# Patient Record
Sex: Female | Born: 1950 | Race: Black or African American | Hispanic: No | Marital: Single | Smoking: Former smoker
Health system: Southern US, Community
[De-identification: ages and names within clinical notes are randomized; demographics above are authoritative.]

## PROBLEM LIST (undated history)

## (undated) DIAGNOSIS — K4090 Unilateral inguinal hernia, without obstruction or gangrene, not specified as recurrent: Secondary | ICD-10-CM

## (undated) DIAGNOSIS — Z9889 Other specified postprocedural states: Secondary | ICD-10-CM

## (undated) DIAGNOSIS — R112 Nausea with vomiting, unspecified: Secondary | ICD-10-CM

## (undated) DIAGNOSIS — G20 Parkinson's disease: Secondary | ICD-10-CM

## (undated) DIAGNOSIS — M1990 Unspecified osteoarthritis, unspecified site: Secondary | ICD-10-CM

## (undated) DIAGNOSIS — Z923 Personal history of irradiation: Secondary | ICD-10-CM

## (undated) DIAGNOSIS — C50919 Malignant neoplasm of unspecified site of unspecified female breast: Secondary | ICD-10-CM

## (undated) DIAGNOSIS — K219 Gastro-esophageal reflux disease without esophagitis: Secondary | ICD-10-CM

## (undated) DIAGNOSIS — G20A1 Parkinson's disease without dyskinesia, without mention of fluctuations: Secondary | ICD-10-CM

## (undated) DIAGNOSIS — K5909 Other constipation: Secondary | ICD-10-CM

## (undated) DIAGNOSIS — L4050 Arthropathic psoriasis, unspecified: Secondary | ICD-10-CM

## (undated) DIAGNOSIS — Z9221 Personal history of antineoplastic chemotherapy: Secondary | ICD-10-CM

## (undated) HISTORY — PX: TOTAL KNEE ARTHROPLASTY: SHX125

## (undated) HISTORY — DX: Parkinson's disease: G20

## (undated) HISTORY — DX: Parkinson's disease without dyskinesia, without mention of fluctuations: G20.A1

## (undated) HISTORY — PX: CATARACT EXTRACTION: SUR2

## (undated) HISTORY — PX: MYOMECTOMY: SHX85

## (undated) HISTORY — DX: Arthropathic psoriasis, unspecified: L40.50

## (undated) HISTORY — DX: Unspecified osteoarthritis, unspecified site: M19.90

## (undated) HISTORY — DX: Malignant neoplasm of unspecified site of unspecified female breast: C50.919

## (undated) HISTORY — DX: Unilateral inguinal hernia, without obstruction or gangrene, not specified as recurrent: K40.90

## (undated) HISTORY — DX: Nausea with vomiting, unspecified: R11.2

---

## 1998-11-02 DIAGNOSIS — I2699 Other pulmonary embolism without acute cor pulmonale: Secondary | ICD-10-CM

## 1998-11-02 DIAGNOSIS — C50919 Malignant neoplasm of unspecified site of unspecified female breast: Secondary | ICD-10-CM

## 1998-11-02 HISTORY — DX: Other pulmonary embolism without acute cor pulmonale: I26.99

## 1998-11-02 HISTORY — PX: MASTECTOMY: SHX3

## 1998-11-02 HISTORY — DX: Malignant neoplasm of unspecified site of unspecified female breast: C50.919

## 2000-02-20 ENCOUNTER — Ambulatory Visit: Admission: RE | Admit: 2000-02-20 | Payer: Self-pay | Source: Ambulatory Visit | Admitting: Plastic Surgery

## 2000-10-29 ENCOUNTER — Inpatient Hospital Stay
Admission: RE | Admit: 2000-10-29 | Disposition: A | Discharge status: Home / Self Care | Payer: Self-pay | Source: Ambulatory Visit | Admitting: Plastic Surgery

## 2001-11-21 ENCOUNTER — Ambulatory Visit
Admit: 2001-11-21 | Disposition: A | Discharge status: Home / Self Care | Payer: Self-pay | Source: Ambulatory Visit | Admitting: Family Medicine

## 2003-06-09 ENCOUNTER — Emergency Department: Admit: 2003-06-09 | Payer: Self-pay | Source: Emergency Department | Admitting: Emergency Medicine

## 2003-11-03 HISTORY — PX: BREAST LUMPECTOMY: SHX2

## 2004-05-27 ENCOUNTER — Ambulatory Visit
Admit: 2004-05-27 | Disposition: A | Discharge status: Home / Self Care | Payer: Self-pay | Source: Ambulatory Visit | Admitting: Surgery

## 2004-06-07 ENCOUNTER — Ambulatory Visit
Admit: 2004-06-07 | Disposition: A | Discharge status: Home / Self Care | Payer: Self-pay | Source: Ambulatory Visit | Admitting: Surgery

## 2004-06-12 ENCOUNTER — Ambulatory Visit
Admit: 2004-06-12 | Disposition: A | Discharge status: Home / Self Care | Payer: Self-pay | Source: Ambulatory Visit | Admitting: Surgery

## 2004-06-12 ENCOUNTER — Ambulatory Visit: Admission: RE | Admit: 2004-06-12 | Payer: Self-pay | Source: Ambulatory Visit | Admitting: Surgery

## 2004-07-02 ENCOUNTER — Ambulatory Visit
Admit: 2004-07-02 | Disposition: A | Discharge status: Home / Self Care | Payer: Self-pay | Source: Ambulatory Visit | Admitting: Radiation Oncology

## 2004-09-30 ENCOUNTER — Ambulatory Visit
Admit: 2004-09-30 | Disposition: A | Discharge status: Home / Self Care | Payer: Self-pay | Source: Ambulatory Visit | Admitting: Radiation Oncology

## 2004-11-22 ENCOUNTER — Emergency Department: Admit: 2004-11-22 | Payer: Self-pay | Source: Emergency Department | Admitting: Emergency Medicine

## 2007-02-01 ENCOUNTER — Emergency Department: Admit: 2007-02-01 | Payer: Self-pay | Source: Emergency Department | Admitting: Emergency Medicine

## 2007-06-27 ENCOUNTER — Ambulatory Visit
Admit: 2007-06-27 | Disposition: A | Discharge status: Home / Self Care | Payer: Self-pay | Source: Ambulatory Visit | Admitting: Medical Oncology

## 2011-06-02 ENCOUNTER — Ambulatory Visit
Admit: 2011-06-02 | Discharge: 2011-06-02 | Disposition: A | Discharge status: Home / Self Care | Payer: Self-pay | Source: Ambulatory Visit

## 2011-06-02 LAB — CBC
Hematocrit: 44.4 % (ref 37.0–47.0)
Hgb: 15.4 g/dL (ref 12.0–16.0)
MCH: 33.5 pg — ABNORMAL HIGH (ref 28.0–32.0)
MCHC: 34.7 g/dL (ref 32.0–36.0)
MCV: 96.5 fL (ref 80.0–100.0)
MPV: 10.5 fL (ref 9.4–12.3)
Nucleated RBC: 0 /100 WBC
Platelets: 192 10*3/uL (ref 140–400)
RBC: 4.6 10*6/uL (ref 4.20–5.40)
RDW: 12 % (ref 12–15)
WBC: 3.82 10*3/uL (ref 3.50–10.80)

## 2011-06-02 LAB — COMPREHENSIVE METABOLIC PANEL
ALT: 20 U/L (ref 0–55)
AST (SGOT): 22 U/L (ref 5–34)
Albumin/Globulin Ratio: 1.3 (ref 0.9–2.2)
Albumin: 3.8 g/dL (ref 3.5–5.0)
Alkaline Phosphatase: 64 U/L (ref 40–150)
Anion Gap: 11 (ref 5.0–15.0)
BUN: 17 mg/dL (ref 7.0–19.0)
Bilirubin, Total: 0.4 mg/dL (ref 0.2–1.2)
CO2: 22 mEq/L (ref 22–29)
Calcium: 9.4 mg/dL (ref 8.5–10.5)
Chloride: 106 mEq/L (ref 98–107)
Creatinine: 0.6 mg/dL (ref 0.6–1.0)
Globulin: 3 g/dL (ref 2.0–3.6)
Glucose: 86 mg/dL (ref 70–100)
Potassium: 4.2 mEq/L (ref 3.5–5.1)
Protein, Total: 6.8 g/dL (ref 6.0–8.3)
Sodium: 139 mEq/L (ref 136–145)

## 2011-06-02 LAB — HEMOLYSIS INDEX: Hemolysis Index: 31 Index — ABNORMAL HIGH (ref 0–18)

## 2011-06-02 LAB — GFR: EGFR: >60

## 2011-06-05 ENCOUNTER — Ambulatory Visit
Admission: RE | Admit: 2011-06-05 | Discharge: 2011-06-05 | Payer: Self-pay | Source: Ambulatory Visit | Attending: Podiatrist | Admitting: Podiatrist

## 2011-07-17 LAB — ECG 12-LEAD
Atrial Rate: 76 {beats}/min
P Axis: 54 degrees
P-R Interval: 160 ms
Q-T Interval: 382 ms
QRS Duration: 86 ms
QTC Calculation (Bezet): 429 ms
R Axis: 61 degrees
T Axis: 54 degrees
Ventricular Rate: 76 {beats}/min

## 2011-09-19 NOTE — Op Note (Signed)
Yvette Anthony, Yvette Anthony      MRN:          [id number]      Account:      [id number]      Document ID:  [id number]      Procedure Date: 06/05/2011            Admit Date: 06/05/2011            Patient Location: DISCHARGED 06/05/2011      Patient Type: A            SURGEON: Barton Fanny DPM      ASSISTANT:  Earna Coder DPM                  PREOPERATIVE DIAGNOSIS:      Hammertoe deformities, fourth and fifth left foot.            POSTOPERATIVE DIAGNOSIS:      Hammertoe deformities, fourth and fifth left foot.            TITLE OF PROCEDURE:      Hammertoe correction, fourth and fifth toes, left foot.            ANESTHESIA:      MAC.            HEMOSTASIS:      Ankle tourniquet.            DESCRIPTION OF PROCEDURE:      On June 05, 2011, the patient was taken from the preoperative area to the      operating room and placed on the operating table in supine position.      Anesthesia was administered and the left foot was then anesthetized in the      form of a local block.  The left foot was then prepped and draped in the      normal and customary fashion.  Left foot exsanguinated, and the left ankle      tourniquet inflated.            Two semi-converging elliptical incisions were then made over the proximal      interphalangeal joint of the fourth toe of the left foot.  Incisions were      deepened and the intervening wedge of tissue was excised.  The dissection      was carried down to the level of the proximal interphalangeal joint.      Transverse capsulotomy was made into the proximal interphalangeal joint.      All capsular attachments to the head of the proximal phalanx were freed.      The head of the proximal phalanx was then resected in total.  Surgical site      was copiously irrigated.  The capsular structures were closed utilizing      simple interrupted sutures of 3-0 Vicryl, and the skin was closed utilizing      simple interrupted sutures of 4-0 nylon.            Two semi-converging elliptical incisions were  then made over the proximal      interphalangeal joint of the fifth toe of the left foot.  The incisions                                   Page 1 of 2      Yvette Anthony, Yvette Anthony      MRN:  [id number]      Account:      [id number]      Document ID:  [id number]      Procedure Date: 06/05/2011            were deepened and the intervening wedge of tissue was excised.  Dissection      was carried down to the level of the proximal interphalangeal joint, taking      care to preserve and retract all vital structures.  Transverse capsulotomy      was made in the proximal interphalangeal joint.  All capsular attachments      to the head of the proximal phalanx were freed.  The head of the proximal      phalanx was then resected in total.  Surgical site was copiously irrigated.       Capsular structures were reopposed utilizing simple interrupted sutures of      3-0 Vicryl, and the skin was closed utilizing simple interrupted sutures of      4-0 nylon.            A dry sterile dressing consisting of Adaptic, 4 x 4 gauze, Kling, Kerlix,      and an Ace wrap was applied to the left foot.  The tourniquet was released      and vascular status was noted to return immediately to normal preoperative      levels.            The patient tolerated the procedure and anesthesia well.  There were no      complications.  Estimated blood loss was nil.  The patient was taken from      the operating room to the recovery room awake with vital signs stable.                        Electronic Signing Provider            D:  06/23/2011 11:40 AM by Dr. Barton Fanny, DPM [id number]      T:  06/23/2011 11:57 AM by [id number]                  cc:                                   Page 2 of 2      Authenticated by Barton Fanny, DPM ([id number]) On 06/23/2011 06:22:57 PM

## 2012-03-27 ENCOUNTER — Emergency Department
Admit: 2012-03-27 | Discharge: 2012-03-27 | Disposition: A | Discharge status: Home / Self Care | Payer: Self-pay | Source: Emergency Department | Admitting: Emergency Medicine

## 2012-03-27 LAB — CBC AND DIFFERENTIAL
Basophils Absolute Automated: 0.02 10*3/uL (ref 0.00–0.20)
Basophils Automated: 0 % (ref 0–2)
Eosinophils Absolute Automated: 0.04 10*3/uL (ref 0.00–0.70)
Eosinophils Automated: 1 % (ref 0–5)
Hematocrit: 48.6 % — ABNORMAL HIGH (ref 37.0–47.0)
Hgb: 16.6 g/dL — ABNORMAL HIGH (ref 12.0–16.0)
Lymphocytes Absolute Automated: 1.02 10*3/uL (ref 0.50–4.40)
Lymphocytes Automated: 21 % (ref 15–41)
MCH: 33.6 pg — ABNORMAL HIGH (ref 28.0–32.0)
MCHC: 34.2 g/dL (ref 32.0–36.0)
MCV: 98.4 fL (ref 80.0–100.0)
MPV: 9.6 fL (ref 9.4–12.3)
Monocytes Absolute Automated: 0.32 10*3/uL (ref 0.00–1.20)
Monocytes: 6 % (ref 0–11)
Neutrophils Absolute: 3.53 10*3/uL (ref 1.80–8.10)
Neutrophils: 72 % (ref 52–75)
Platelets: 220 10*3/uL (ref 140–400)
RBC: 4.94 10*6/uL (ref 4.20–5.40)
RDW: 13 % (ref 12–15)
WBC: 4.93 10*3/uL (ref 3.50–10.80)

## 2012-03-27 LAB — URINALYSIS, REFLEX TO MICROSCOPIC EXAM IF INDICATED
Blood, UA: NEGATIVE
Glucose, UA: NEGATIVE
Leukocyte Esterase, UA: NEGATIVE
Nitrite, UA: NEGATIVE
Protein, UR: 30 — AB
RBC, UA: NONE SEEN /HPF (ref 0–5)
Specific Gravity UA: 1.025 (ref 1.001–1.035)
Urine pH: 6 (ref 5.0–8.0)
Urobilinogen, UA: 0.2 mg/dL
WBC, UA: NONE SEEN /HPF (ref 0–5)

## 2012-03-27 LAB — GFR: EGFR: >60

## 2012-03-27 LAB — BASIC METABOLIC PANEL
BUN: 16 mg/dL (ref 7–21)
CO2: 27 mEq/L (ref 22–31)
Calcium: 9.9 mg/dL (ref 8.6–10.2)
Chloride: 102 mEq/L (ref 98–107)
Creatinine: 0.6 mg/dL (ref 0.5–1.4)
Glucose: 99 mg/dL (ref 70–100)
Potassium: 4 mEq/L (ref 3.6–5.0)
Sodium: 142 mEq/L (ref 136–143)

## 2012-10-17 ENCOUNTER — Emergency Department
Admission: EM | Admit: 2012-10-17 | Discharge: 2012-10-17 | Disposition: A | Discharge status: Home / Self Care | Payer: BLUE CROSS/BLUE SHIELD | Attending: Emergency Medicine | Admitting: Emergency Medicine

## 2012-10-17 ENCOUNTER — Emergency Department: Payer: BLUE CROSS/BLUE SHIELD

## 2012-10-17 DIAGNOSIS — J40 Bronchitis, not specified as acute or chronic: Secondary | ICD-10-CM | POA: Insufficient documentation

## 2012-10-17 DIAGNOSIS — Z86711 Personal history of pulmonary embolism: Secondary | ICD-10-CM | POA: Insufficient documentation

## 2012-10-17 DIAGNOSIS — Z853 Personal history of malignant neoplasm of breast: Secondary | ICD-10-CM | POA: Insufficient documentation

## 2012-10-17 MED ORDER — AZITHROMYCIN 250 MG PO TABS
250.00 mg | ORAL_TABLET | Freq: Every day | ORAL | Status: AC
Start: 2012-10-17 — End: 2012-10-23

## 2012-10-17 NOTE — Discharge Instructions (Signed)
Bronchitis    You have been diagnosed with bronchitis.    Bronchitis is an irritation of the large breathing tubes. It can be caused by tobacco smoke, air pollution, or an infection. Patients with bronchitis are short of breath and may cough up green or yellow mucous. These symptoms are usually worse at night, when lying flat and in wet weather. Most people with bronchitis do not need antibiotics. If your doctor prescribes antibiotics, fill the prescription and take all the medicine according to the instructions.    Bronchitis is usually treated with medicine to help stop coughing. An inhaler with Albuterol or Ventolin is sometimes used to help with cough. It is best to use the inhaler with a spacer to help the medicine reach your lungs. The doctor can prescribe a spacer.    Bronchitis is usually caused by a virus. Antibiotics do not kill viruses. In fact, antibiotics do not affect viruses in any way. In the past, some doctors prescribed antibiotics for people with bronchitis. We now know that antibiotics are not helpful for most bronchitis patients. Patients who might need antibiotics are those with lung problems that don't go away, like emphysema or COPD.    Your coughing and wheezing might last for 2 or 3 weeks! The symptoms should get better over this time period and not worse.    Do not smoke. Research shows that smoking causes heart disease, cancer, and birth defects. Avoiding smoking will help your asthma. If you smoke, ask your doctor for ideas about how to stop.   If you do not smoke, avoid others who do.    YOU SHOULD SEEK MEDICAL ATTENTION IMMEDIATELY, EITHER HERE OR AT THE NEAREST EMERGENCY DEPARTMENT, IF ANY OF THE FOLLOWING OCCURS:   You wheeze or have trouble breathing.   You have a fever (temperature higher than 101 F or 38.3 C), that won't go away.   You have chest pain.   You vomit or cannot keep liquids down or you feel weak or dizzy.   Your symptoms get worse over the next 2 or 3  days.

## 2012-10-17 NOTE — ED Provider Notes (Signed)
EMERGENCY DEPARTMENT HISTORY AND PHYSICAL EXAM     Physician/Midlevel provider first contact with patient: 10/17/12 1435         Date: 10/17/2012  Patient Name: Yvette Anthony    History of Presenting Illness     Chief Complaint   Patient presents with   . Cough       History Provided By: Patient    Chief Complaint: Cough  Onset: 4 Days  Timing: Gradual  Location: Respiratory  Quality: Worsening  Severity: Moderate  Modifying Factors: Nyquil without relief  Associated Symptoms: SOB, Rhinorrhea    Additional History: Yvette Anthony is a 61 y.o. female pt c/o productive cough with dark yellow phlegm since 4 days. Pt has some SOB and rhinorrhea sxs. Pt has been taking Nyquil without relief. Pt also reports co-workers having PNA sxs, why pt came into ED for evaluation.   Denies fever, CP.    PCP: Rudene Anda, MD      No current facility-administered medications for this encounter.     Current Outpatient Prescriptions   Medication Sig Dispense Refill   . Eszopiclone (ESZOPICLONE) 3 MG tablet Take 3 mg by mouth nightly. Take immediately before bedtime       . glucosamine-chondroitin 500-400 MG tablet Take 1 tablet by mouth 3 (three) times daily.       Marland Kitchen venlafaxine (EFFEXOR) 75 MG tablet Take 75 mg by mouth 2 (two) times daily.       Marland Kitchen azithromycin (ZITHROMAX Z-PAK) 250 MG tablet Take 1 tablet (250 mg total) by mouth daily. 2 tablets on day one, then 1 tablet on days 2 through 5  6 tablet  0       Past History     Past Medical History:  Past Medical History   Diagnosis Date   . Cancer of breast, female    . Pulmonary embolism 2000       Past Surgical History:  Past Surgical History   Procedure Date   . Mastectomy      left   . Breast lumpectomy      right   . Myomectomy        Family History:  History reviewed. No pertinent family history.    Social History:  History   Substance Use Topics   . Smoking status: Former Smoker -- 10 years     Types: Cigarettes     Quit date: 10/17/1997   . Smokeless tobacco: Not on file   .  Alcohol Use: Yes      Comment: rare       Allergies:  Allergies   Allergen Reactions   . Ciprofloxacin Nausea Only       Review of Systems     Review of Systems   Constitutional: Negative for fever.   HENT: Positive for rhinorrhea.    Respiratory: Positive for cough (Productive with yellow phlegm) and shortness of breath.    Cardiovascular: Negative for chest pain.   Psychiatric/Behavioral: Negative for suicidal ideas.   All other systems reviewed and are negative.      Physical Exam   BP 151/75  Pulse 75  Temp 98.6 F (37 C) (Oral)  Resp 22  Ht 1.778 m  Wt 86.183 kg  BMI 27.26 kg/m2  SpO2 96%    Physical Exam   Nursing note and vitals reviewed.  Constitutional: She is oriented to person, place, and time. She appears well-developed and well-nourished.   HENT:   Right Ear: External  ear normal.   Left Ear: External ear normal.   Mouth/Throat: No oropharyngeal exudate.   Eyes: Conjunctivae normal are normal. Pupils are equal, round, and reactive to light.   Cardiovascular: Normal rate and regular rhythm.    Pulmonary/Chest: Effort normal and breath sounds normal. No respiratory distress. She has no wheezes.   Lymphadenopathy:     She has no cervical adenopathy.   Neurological: She is alert and oriented to person, place, and time.   Skin: Skin is warm and dry.         Diagnostic Study Results     Labs -     Results     ** No Results found for the last 24 hours. **          Radiologic Studies -   Radiology Results (24 Hour)     ** No Results found for the last 24 hours. **          Medical Decision Making   I am the first provider for this patient.    I reviewed the vital signs, available nursing notes, past medical history, past surgical history, family history and social history.    Vital Signs-Reviewed the patient's vital signs.     Patient Vitals for the past 12 hrs:   BP Temp Pulse Resp   10/17/12 1423 151/75 mmHg 98.6 F (37 C) 75  22        Pulse Oximetry Analysis - Normal 96% on RA    Old Medical  Records: Nursing notes.     ED Course:   2:41 PM  Discussed results with pt and counseled on diagnosis, f/u plans, and signs and symptoms when to return to ED.  Pt is stable and ready for discharge.      Provider Notes:  Uncomplicated bronchitis.  Given rx for azithromycin.      Procedures:      Diagnosis     Clinical Impression:   1. Bronchitis        _______________________________    Attestations: This note is prepared by Davina Poke, acting as Scribe for Dr. Tenny Craw, MD.    Dr. Tenny Craw, MD  - The scribe's documentation has been prepared under my direction and personally reviewed by me in its entirety.  I confirm that the note above accurately reflects all work, treatment, procedures, and medical decision making performed by me.  _______________________________            Sharyne Richters, MD  10/18/12 1020

## 2012-10-17 NOTE — ED Notes (Signed)
Cough for about 4 days, productive with yellow phlegm. Denies other sx

## 2012-10-24 NOTE — Discharge Summary (Unsigned)
JUNE, RODE                       #[id number]            ADMITTED:  10/29/2000               DISCHARGED:  11/01/2000            ATTENDING PHYSICIAN:                Guilford Shi, MD            REASON FOR ADMISSION:  Reconstruction of the left breast.            HISTORY:  The patient is a 61 year old female who was diagnosed with breast      cancer two years ago.  She underwent  a  mastectomy  at that time.  She was      also reconstructed initially with the  left  breast  with a tissue expander      and implant.  She has subsequently developed extensive capsular contracture      and presents now for revision and reconstruction  of  the  left breast with      autologous tissue.            HOSPITAL COURSE:  On 10/29/2000, she was  taken to the operating room where      a  periprosthetic  capsulectomy was performed  of  the  left  reconstructed      breast.  A TRAM flap was then performed  for  reconstruction  of  the  left      breast with autologous tissue.  The patient tolerated the procedure well.            She was discharged from recovery to the  floor  in  satisfactory condition.      Her  postoperative  course  was  unremarkable.    She  had  resumed  normal      ambulatory and dietary routines prior  to  discharge.   She  was discharged      with drains and subcutaneous tissue.   Instructions  were given for care of      the drains.            DISCHARGE MEDICATIONS included      1.  Keflex 500 mg one p.o. q.6h.      2.  Percocet one to two p.o. q.4h. p.r.n.            RESTRICTIONS/INSTRUCTIONS were given.            POSTOPERATIVE FOLLOWUP will be in five days.                                                              _____________________________________                                            _____  Sully Dyment Virgie Dad, MD      HJB/crw      D: 12/16/2000 4:00 P      T: 12/17/2000  3:15 P      J: [id number]      N: [id number]      CC: Guilford Shi, MD

## 2012-11-04 NOTE — Op Note (Unsigned)
PATIENTGREENLEY, Yvette Anthony      MEDICAL RECORD NUMBER:         [id number]      PATIENT LOCATION/SERVICE:      RAD            DATE OF SURGERY:               06/12/2004      SURGEON:                       Almira Bar, MD      ASSISTANT 1:            ANESTHESIA:  LOCAL MAC            PREOPERATIVE DIAGNOSIS:  CARCINOMA OF THE RIGHT BREAST            POSTOPERATIVE DIAGNOSIS:  CARCINOMA OF THE RIGHT BREAST            OPERATION:      1. PREOPERATIVE MAMMOGRAPHIC LOCALIZATION OF TUMOR, RIGHT BREAST      2. WIDE LOCAL EXCISION OF TUMOR, RIGHT BREAST            SPECIMEN:  X-RAY, SENTINEL NODE BIOPSY, RIGHT AXILLA            DESCRIPTION OF PROCEDURE:  The patient was brought to the operating room      from the Radiology Department where she had undergone preoperative      localization for a tumor in the 12 o'clock radius of the right breast.  She      had also undergone preoperative injection of radioisotope with imaging      displaying two sites of activity in the low anterior axilla.  The patient      was placed in the supine position on the operating table with the right arm      extended.      5 cc of Isosulfan blue dye were injected into the subareolar space of the      right breast.  The breast was massaged in order to enhance migration of dye      into the lymphatics.  While waiting for dye to migrate, the gamma detector      probe was brought to the field and utilized to examine the axillary space.      A focus of obvious increased radioactive uptake was identified in the low      anterior axilla consistent with the location of a sentinel node.  This site      was marked.  The axilla and breast were now prepared and the axilla was      draped as a sterile field with the breast temporarily draped out of the      field, but otherwise protected.  Local anesthetic was infiltrated in a      transversely oriented direction at the site of the previously identified      radioactive uptake.  A transverse  incision was made through the      anesthetized skin and carried down through the subcutaneous tissue and      through the axillary fascia to the axillary space.  The gamma detector      probe wrapped in a sterile sleeve was utilized to guide detection and led      to a discrete lymph  node in the low anterior axilla which, as it was      dissected free, was noted to be stained with blue dye.  This lymph node was      gradually separated from the surrounding fibrofatty attachments of the      axillary fat pad and removed from the field.  Examination confirmed      radioactivity counts in the range of 400 per second.  The node was      submitted as a sentinel node.  Examination of the axillary space now      disclosed a second site of radioactivity slightly less intense than the      initial site superior to the position of the initial sentinel node.      Accordingly, with additional blunt and sharp dissection a second      blue-stained lymph node was then identified and excised from the axillary      space.  This lymph node displayed counts in the range of 300 per second.      Evaluation of the background counts from the axillary space now yielded      levels less than 10 per second.  These two lymph nodes were submitted as      sentinel nodes and the sentinel node procedure was brought to a close.  The      axillary fascia was closed with several interrupted sutures of #4-0      Monocryl.  The skin edges were re-approximated with a running subcuticular      suture of #4-0 Monocryl.  Attention was now turned to the upper right      breast.  The breast was exposed and draped as a fresh sterile field.  A      fresh set up was utilized for the wide local excision.  The patient had a      guide-wire emerging from the 12 o'clock radius of the breast.  The tip of      the guide-wire had come to rest at the site of the patient's lesion marked      by the presence of a metallic clip from a previous stereotactic biopsy.       Local anesthetic was infiltrated in a transversely oriented direction just      inferior to the exit site of the guide-wire and a narrow curvilinear      elliptical incision was marked across the upper portion of the breast      following the skin lines.  This site was infiltrated with local anesthetic.      The upper and lower limbs of this incision were then carried into the      subcutaneous tissue.  The guide-wire could be identified in the      subcutaneous tissue in the upper portion of the wound.  It was now drawn      through the incision and could be tracked into the underlying breast      parenchyma.  With sharp dissection and electrocautery wide local excision      around the tissue at the tip of the guide-wire was now accomplished.      Dissection was carried above, below, medial, lateral, and deep to the      tissue at the tip of the wire, passing through grossly normal fibrofatty      breast tissue in all planes.  A full thickness of breast tissue was      obtained with the deep  margin of the specimen reaching the fascia of the      underlying pectoralis major muscle.  Vessels encountered during the      dissection were secured with electrocautery was necessary.  The specimen      was removed from the field and was oriented for the pathologist with a      suture in the medial pole of the skin ellipse.  The specimen was submitted      for specimen x-ray in order to confirm satisfactory extraction of the      stereotactic clip.  While waiting for that report the defect in the breast      tissue was closed inlayers utilizing a running suture of #3-0 plain Catgut      followed by a running subcutaneous suture of #4-0 plain Catgut.  The skin      edges were re-approximated with a running subcuticular suture of #5-0      Monocryl.  Specimen film was reported to contain the metallic clip at its      center.  Accordingly, the procedure was considered satisfactory.  The      patient was now returned to the  Outpatient Department in satisfactory      condition having tolerated the procedure well.                  ___________________________________     Date Signed: _______________      Almira Bar, MD                  [id number]      D:    06/14/2004      T:    06/16/2004      #:    [id number]      N:    [id number]            cc:   Almira Bar, MD

## 2013-03-14 ENCOUNTER — Emergency Department: Payer: BLUE CROSS/BLUE SHIELD

## 2013-03-14 ENCOUNTER — Emergency Department
Admission: EM | Admit: 2013-03-14 | Discharge: 2013-03-14 | Disposition: A | Discharge status: Home / Self Care | Payer: BLUE CROSS/BLUE SHIELD | Attending: Emergency Medicine | Admitting: Emergency Medicine

## 2013-03-14 DIAGNOSIS — Z853 Personal history of malignant neoplasm of breast: Secondary | ICD-10-CM | POA: Insufficient documentation

## 2013-03-14 DIAGNOSIS — R319 Hematuria, unspecified: Secondary | ICD-10-CM | POA: Insufficient documentation

## 2013-03-14 DIAGNOSIS — Z86711 Personal history of pulmonary embolism: Secondary | ICD-10-CM | POA: Insufficient documentation

## 2013-03-14 DIAGNOSIS — N390 Urinary tract infection, site not specified: Secondary | ICD-10-CM | POA: Insufficient documentation

## 2013-03-14 LAB — URINALYSIS, REFLEX TO MICROSCOPIC EXAM IF INDICATED
Bilirubin, UA: NEGATIVE
Glucose, UA: NEGATIVE
Ketones UA: NEGATIVE
Leukocyte Esterase, UA: NEGATIVE
Nitrite, UA: NEGATIVE
Protein, UR: NEGATIVE
Specific Gravity UA: 1.005 (ref 1.001–1.035)
Urine pH: 7 (ref 5.0–8.0)
Urobilinogen, UA: NORMAL mg/dL

## 2013-03-14 MED ORDER — SULFAMETHOXAZOLE-TRIMETHOPRIM 800-160 MG PO TABS
1.0000 | ORAL_TABLET | Freq: Two times a day (BID) | ORAL | Status: DC
Start: 2013-03-14 — End: 2014-06-11

## 2013-03-14 MED ORDER — PHENAZOPYRIDINE HCL 200 MG PO TABS
200.0000 mg | ORAL_TABLET | Freq: Three times a day (TID) | ORAL | Status: DC | PRN
Start: 2013-03-14 — End: 2014-06-11

## 2013-03-14 MED ORDER — CEPHALEXIN 500 MG PO CAPS
500.0000 mg | ORAL_CAPSULE | Freq: Three times a day (TID) | ORAL | Status: DC
Start: 2013-03-14 — End: 2013-03-14

## 2013-03-14 MED ORDER — SULFAMETHOXAZOLE-TMP DS 800-160 MG PO TABS
1.0000 | ORAL_TABLET | Freq: Once | ORAL | Status: AC
Start: 2013-03-14 — End: 2013-03-14
  Administered 2013-03-14: 1 via ORAL
  Filled 2013-03-14: qty 1

## 2013-03-14 MED ORDER — CEPHALEXIN 500 MG PO CAPS
500.0000 mg | ORAL_CAPSULE | Freq: Once | ORAL | Status: DC
Start: 2013-03-14 — End: 2013-03-14

## 2013-03-14 NOTE — Discharge Instructions (Signed)
Please take full course of antibiotic.   Drink plenty of fluids to flush out the urinary tract infection.   Follow up with your primary care physician in 2-3 days for repeat evaluation.   Return to the ER if you experience fever, vomiting, back pain, worsening symptoms or any questions and concerns.

## 2013-03-14 NOTE — ED Notes (Signed)
Pt reports early stages of UTI symptoms that began this morning. Pt reports pressure and urine frequency. Pt denies fever, n,abd pain,back pain

## 2013-03-14 NOTE — ED Provider Notes (Signed)
EMERGENCY DEPARTMENT HISTORY AND PHYSICAL EXAM     Physician/Midlevel provider first contact with patient: 03/14/13 1610         Date: 03/14/2013  Patient Name: Yvette Anthony    History of Presenting Illness     Chief Complaint   Patient presents with   . Urinary Tract Infection Symptoms       History Provided By: patient    Chief Complaint: UTI sxs  Onset: this AM   Timing: intermittent   Location: urinary   Quality: pressure  Severity: moderate  Modifying Factors: worse with urination    Associated Symptoms: urinary frequency     Additional History: Yvette Anthony is a 62 y.o. female presenting to the ED c/o UTI sxs which began this AM. Pt sts she has had UTIs in the past that felt similar to this. Pt describes sxs of pressure and urinary frequency. Pt denies hematuria, dysuria, fever, abdominal pain, n/v/d, back pain. The pt has no known medication allergies. Pt has no other concerns at the moment.   PCP: Rudene Anda, MD      Current Facility-Administered Medications   Medication Dose Route Frequency Provider Last Rate Last Dose   . sulfamethoxazole-trimethoprim (BACTRIM DS) 800-160 MG per tablet 1 tablet  1 tablet Oral Once Brooke Bonito, DO       . [DISCONTINUED] cephALEXin (KEFLEX) capsule 500 mg  500 mg Oral Once Brooke Bonito, DO         Current Outpatient Prescriptions   Medication Sig Dispense Refill   . glucosamine-chondroitin 500-400 MG tablet Take 1 tablet by mouth 3 (three) times daily.       . phenazopyridine (PYRIDIUM) 200 MG tablet Take 1 tablet (200 mg total) by mouth 3 (three) times daily as needed for Pain (as needed for pain or burning).  10 tablet  0   . sulfamethoxazole-trimethoprim (BACTRIM DS) 800-160 MG per tablet Take 1 tablet by mouth 2 (two) times daily.  14 tablet  no   . venlafaxine (EFFEXOR) 75 MG tablet Take 75 mg by mouth 2 (two) times daily.       . [DISCONTINUED] cephALEXin (KEFLEX) 500 MG capsule Take 1 capsule (500 mg total) by mouth 3 (three) times daily.  21 capsule  0    . [DISCONTINUED] Eszopiclone (ESZOPICLONE) 3 MG tablet Take 3 mg by mouth nightly. Take immediately before bedtime           Past History     Past Medical History:  Past Medical History   Diagnosis Date   . Cancer of breast, female    . Pulmonary embolism 2000       Past Surgical History:  Past Surgical History   Procedure Date   . Mastectomy      left   . Breast lumpectomy      right   . Myomectomy        Family History:  History reviewed. No pertinent family history.    Social History:  History   Substance Use Topics   . Smoking status: Former Smoker -- 10 years     Types: Cigarettes     Quit date: 10/17/1997   . Smokeless tobacco: Not on file   . Alcohol Use: Yes      Comment: rare       Allergies:  Allergies   Allergen Reactions   . Ciprofloxacin Nausea Only       Review of Systems     Review of Systems  Constitutional: Negative for fever and chills.   HENT: Negative for congestion and sore throat.    Eyes: Negative.    Respiratory: Negative.    Cardiovascular: Negative for chest pain and palpitations.   Gastrointestinal: Negative for nausea and abdominal pain.   Genitourinary: Positive for frequency. Negative for dysuria and hematuria.        +pressure after urination   Musculoskeletal: Negative for back pain and falls.   Skin: Negative for itching and rash.   Endo/Heme/Allergies: Negative for environmental allergies.         Physical Exam   BP 151/70  Pulse 75  Temp 98.2 F (36.8 C)  Resp 16  Ht 1.778 m  Wt 86.183 kg  BMI 27.26 kg/m2  SpO2 97%    Physical Exam   Constitutional: Patient is oriented to person, place, and time and well-developed, well-nourished, and in no distress.   Head: Normocephalic and atraumatic.   Eyes: EOM are normal. Pupils are equal, round, and reactive to light.   Neck: Normal range of motion. Neck supple.   Cardiovascular: Normal rate and regular rhythm.   Pulmonary/Chest: Effort normal and breath sounds normal. No respiratory distress.   Abdominal: Soft. There is no  tenderness. Bowel sounds present and normal.  Musculoskeletal: Normal range of motion.   Neurological: Patient is alert and oriented to person, place, and time. GCS score is 15.   Skin: Skin is warm and dry.       Diagnostic Study Results     Labs -     Results     Procedure Component Value Units Date/Time    UA, Reflex to Microscopic [[id number]]  (Abnormal) Collected:03/14/13 0913    Specimen Information:Urine Updated:03/14/13 4034     Urine Type Clean Catch      Color, UA Yellow      Clarity, UA Clear      Specific Gravity UA 1.005      Urine pH 7.0      Leukocytes, UA Negative      Nitrite, UA Negative      Protein, UA Negative      Glucose, UA Negative      Ketones UA Negative      Urobilinogen, UA Normal mg/dL      Bilirubin, UA Negative      Blood, UA Moderate (A)      RBC, UA 26-50 (A)      WBC, UA 0-5      Squamous Epithelial Cells, Urine 0-5     Urine Culture [[id number]] Collected:03/14/13 0913    Specimen Information:Urine / Urine, Clean Catch Updated:03/14/13 0916          Radiologic Studies -   Radiology Results (24 Hour)     ** No Results found for the last 24 hours. **      .      Medical Decision Making   I am the first provider for this patient.    I reviewed the vital signs, available nursing notes, past medical history, past surgical history, family history and social history.    Vital Signs-Reviewed the patient's vital signs.     Patient Vitals for the past 12 hrs:   BP Temp Pulse Resp   03/14/13 0911 151/70 mmHg 98.2 F (36.8 C) 75  16        Pulse Oximetry Analysis - Normal 97% on RA      Provider Notes:  57 F notes s/sx of UTI.  She is requesting bactrim.  UA reveals hematuria without sx of UTI.  I have sent the culture.  Patient notes that bactrim would resolve these symptoms, therefore the script was provided.  I advised she only start script if symptoms progress, as her symptoms have only been for 6 hours.  Patient agrees and will f/u with PCP later this week.    Diagnosis     Clinical  Impression:   1. Urinary tract infection    2. Hematuria        _______________________________    Attestations:  This note is prepared by Adelina Mings, acting as Scribe for Brooke Bonito, DO.     Dr. Catha Gosselin: The scribe's documentation has been prepared under my direction and personally reviewed by me in its entirety.  I confirm that the note above accurately reflects all work, treatment, procedures, and medical decision making performed by me.    _______________________________          Brooke Bonito, DO  03/14/13 [phone]

## 2013-03-15 NOTE — Progress Notes (Signed)
Quick Note:    Negative result, no follow up required.  ______

## 2013-04-24 ENCOUNTER — Other Ambulatory Visit: Payer: Self-pay | Admitting: Gastroenterology

## 2013-05-01 ENCOUNTER — Ambulatory Visit: Payer: BLUE CROSS/BLUE SHIELD

## 2013-05-29 ENCOUNTER — Ambulatory Visit
Admission: RE | Admit: 2013-05-29 | Discharge: 2013-05-29 | Disposition: A | Discharge status: Home / Self Care | Payer: BLUE CROSS/BLUE SHIELD | Source: Ambulatory Visit | Attending: Gastroenterology | Admitting: Gastroenterology

## 2013-05-29 ENCOUNTER — Ambulatory Visit: Payer: BLUE CROSS/BLUE SHIELD

## 2013-05-29 DIAGNOSIS — R112 Nausea with vomiting, unspecified: Secondary | ICD-10-CM | POA: Insufficient documentation

## 2013-05-29 MED ORDER — TECHNETIUM TC 99M SULFUR COLLOID
1.0000 | Freq: Once | Status: AC | PRN
Start: 2013-05-29 — End: 2013-05-29
  Administered 2013-05-29: 1 via ORAL

## 2014-03-23 ENCOUNTER — Ambulatory Visit
Admission: RE | Admit: 2014-03-23 | Discharge: 2014-03-23 | Disposition: A | Discharge status: Home / Self Care | Payer: BLUE CROSS/BLUE SHIELD | Source: Ambulatory Visit | Attending: Rheumatology | Admitting: Rheumatology

## 2014-03-23 ENCOUNTER — Other Ambulatory Visit: Payer: Self-pay | Admitting: Rheumatology

## 2014-03-23 DIAGNOSIS — R609 Edema, unspecified: Secondary | ICD-10-CM

## 2014-03-23 DIAGNOSIS — IMO0002 Reserved for concepts with insufficient information to code with codable children: Secondary | ICD-10-CM

## 2014-03-23 DIAGNOSIS — R52 Pain, unspecified: Secondary | ICD-10-CM

## 2014-03-27 ENCOUNTER — Ambulatory Visit
Admission: RE | Admit: 2014-03-27 | Discharge: 2014-03-27 | Disposition: A | Discharge status: Home / Self Care | Payer: BLUE CROSS/BLUE SHIELD | Source: Ambulatory Visit | Attending: Rheumatology | Admitting: Rheumatology

## 2014-03-27 DIAGNOSIS — R609 Edema, unspecified: Secondary | ICD-10-CM | POA: Insufficient documentation

## 2014-03-27 DIAGNOSIS — R52 Pain, unspecified: Secondary | ICD-10-CM | POA: Insufficient documentation

## 2014-06-11 ENCOUNTER — Emergency Department: Payer: BLUE CROSS/BLUE SHIELD

## 2014-06-11 ENCOUNTER — Emergency Department
Admission: EM | Admit: 2014-06-11 | Discharge: 2014-06-11 | Disposition: A | Discharge status: Home / Self Care | Payer: BLUE CROSS/BLUE SHIELD | Attending: Emergency Medicine | Admitting: Emergency Medicine

## 2014-06-11 DIAGNOSIS — Z853 Personal history of malignant neoplasm of breast: Secondary | ICD-10-CM | POA: Insufficient documentation

## 2014-06-11 DIAGNOSIS — R059 Cough, unspecified: Secondary | ICD-10-CM | POA: Insufficient documentation

## 2014-06-11 DIAGNOSIS — Z86711 Personal history of pulmonary embolism: Secondary | ICD-10-CM | POA: Insufficient documentation

## 2014-06-11 DIAGNOSIS — J40 Bronchitis, not specified as acute or chronic: Secondary | ICD-10-CM | POA: Insufficient documentation

## 2014-06-11 DIAGNOSIS — Z87891 Personal history of nicotine dependence: Secondary | ICD-10-CM | POA: Insufficient documentation

## 2014-06-11 MED ORDER — AZITHROMYCIN 250 MG PO TABS
500.0000 mg | ORAL_TABLET | Freq: Once | ORAL | Status: AC
Start: 2014-06-11 — End: 2014-06-11
  Administered 2014-06-11: 500 mg via ORAL
  Filled 2014-06-11: qty 2

## 2014-06-11 MED ORDER — AZITHROMYCIN 250 MG PO TABS
250.0000 mg | ORAL_TABLET | Freq: Every day | ORAL | Status: AC
Start: 2014-06-11 — End: 2014-06-15

## 2014-06-11 MED ORDER — PREDNISONE 20 MG PO TABS
40.0000 mg | ORAL_TABLET | Freq: Once | ORAL | Status: AC
Start: 2014-06-11 — End: 2014-06-11
  Administered 2014-06-11: 40 mg via ORAL
  Filled 2014-06-11: qty 2

## 2014-06-11 MED ORDER — PREDNISONE 20 MG PO TABS
40.0000 mg | ORAL_TABLET | Freq: Every day | ORAL | Status: AC
Start: 2014-06-11 — End: 2014-06-14

## 2014-06-11 MED ORDER — ALBUTEROL SULFATE HFA 108 (90 BASE) MCG/ACT IN AERS
2.0000 | INHALATION_SPRAY | Freq: Once | RESPIRATORY_TRACT | Status: AC
Start: 2014-06-11 — End: 2014-06-11
  Administered 2014-06-11: 2 via RESPIRATORY_TRACT
  Filled 2014-06-11: qty 1

## 2014-06-11 NOTE — ED Notes (Signed)
Pt with nasal congestion/cold symptoms x 2 weeks, developed non productive cough over the last 2 days. Denies fevers.

## 2014-06-11 NOTE — Discharge Instructions (Signed)
Bronchitis    You have been diagnosed with bronchitis.    Bronchitis is an irritation of the large breathing tubes. It can be caused by tobacco smoke, air pollution, or an infection. Patients with bronchitis are short of breath and may cough up green or yellow mucous. These symptoms are usually worse at night, when lying flat and in wet weather. Most people with bronchitis do not need antibiotics. If your doctor prescribes antibiotics, fill the prescription and take all the medicine according to the instructions.    Bronchitis is usually treated with medicine to help stop coughing. An inhaler with albuterol (Ventolin/Proventil) is sometimes used to help with cough. It is best to use the inhaler with a spacer to help the medicine reach your lungs. The doctor can prescribe a spacer.    Bronchitis is usually caused by a virus. Antibiotics do not kill viruses. In fact, antibiotics do not affect viruses in any way. In the past, some doctors prescribed antibiotics for people with bronchitis. We now know that antibiotics are not helpful for most bronchitis patients. Patients who might need antibiotics are those with lung problems that don't go away, like emphysema or COPD.    Your coughing and wheezing might last for 2 or 3 weeks! The symptoms should get better over this time period and not worse.    Your doctor prescribed an albuterol (Ventolin/Proventil) inhaler. Use it every four hours if you are wheezing, coughing, or short of breath. This will reduce symptoms.    Do not smoke. Research shows that smoking causes heart disease, cancer, and birth defects. Avoiding smoking will help your asthma. If you smoke, ask your doctor for ideas about how to stop.   If you do not smoke, avoid others who do.    YOU SHOULD SEEK MEDICAL ATTENTION IMMEDIATELY, EITHER HERE OR AT THE NEAREST EMERGENCY DEPARTMENT, IF ANY OF THE FOLLOWING OCCURS:   You wheeze or have trouble breathing.   You have a fever (temperature higher  than 100.32F / 38C), that won't go away.   You have chest pain.   You vomit or cannot keep liquids down or you feel weak or dizzy.   Your symptoms get worse over the next 2 or 3 days.      Cough    You have been seen for your cough.    There are many possible causes of cough. Most are not dangerous. Your doctor has determined that it is OK for you to go home today.    Your doctor believes your cough was caused by bacteria. Your doctor prescribed an antibiotic that will fight the bacteria.    The doctor may have prescribed some medicine to help with your cough. Use the medicine as directed.    YOU SHOULD SEEK MEDICAL ATTENTION IMMEDIATELY, EITHER HERE OR AT THE NEAREST EMERGENCY DEPARTMENT, IF ANY OF THE FOLLOWING OCCURS:   You wheeze or have trouble breathing.   You cough up mucous or lose weight for no reason.   You have a fever (temperature higher than 100.32F / 38C) that lasts more than 5 days.   You have chest pain.   Your symptoms get worse or do not get better in 2 or 3 days.   You have any new problems or concerns.       TAKE YOUR ALBUTEROL INHALER 2 PUFFS EVERY 4-6 HOURS AS NEEDED FOR COUGH.     Inhaler Use Instructions    How to use your metered dose inhaler:  Remove the cap from the mouthpiece. Check for small objects that may be stuck in the mouthpiece.   Shake the inhaler.   Place the inhaler into position. It's best to use the inhaler with a spacer device. Using a spacer will help the medication get to your lungs. If you do not have a spacer, your doctor can prescribe one for you. Until you get a spacer, you can substitute by using a large styrofoam cup. Make a hole through the closed end of the cup. Put the inhaler through the hole. Cover your mouth and nose with the open end of the cup. Hold the cup firmly in place. If you do not have a spacer or a styrofoam cup, try placing the inhaler a few inches from your mouth (not in your mouth). If you cannot do this, place your lips around  the mouthpiece of the inhaler.    Push down on the canister to release a dose of the medication.   While you press down, take a slow deep breath. Hold your breath for 10 seconds or more.   Remove the inhaler from your mouth and exhale (breathe out).   Wait about one minute before taking a second dose.

## 2014-06-11 NOTE — ED Provider Notes (Signed)
EMERGENCY DEPARTMENT HISTORY AND PHYSICAL EXAM     Physician/Midlevel provider first contact with patient: 06/11/14 1331         Date: 06/11/2014  Patient Name: Yvette Anthony    History of Presenting Illness     Chief Complaint   Patient presents with   . Cough     History Provided By: Patient    Chief Complaint: Cough   Onset:3-4 days   Timing: Persistent   Quality: Non-productive   Severity:Moderate     Modifying Factors: No relief with OTC meds  Associated Symptoms: +slight SOB, nasal congestion, post nasal drip, No chest pain,vomiting, fevers     Additional History: Yvette Anthony is a 63 y.o. female with Breast CA, and remote PE after surgery presents with cough. Patient reports that she began expereincing "head cold" symptoms 2 weeks ago consisting of nasal congestion and post nasal drip. About 3-4 days ago she began with a non-productive cough accompanied with min shortness of breath. Patient denies known fevers, chest pain, vomiting. No recent travel or sick contacts. Patient reports hx of PE s/p Mastectomy in 2000 and states today's shortness of breath is "nothing compared to that shortness of breath" (From 2000).     PCP: Rudene Anda, MD (General)      No current facility-administered medications for this encounter.     Current Outpatient Prescriptions   Medication Sig Dispense Refill   . Omega-3 Fatty Acids (OMEGA-3 FISH OIL) 500 MG Cap Take by mouth.     Marland Kitchen azithromycin (ZITHROMAX) 250 MG tablet Take 1 tablet (250 mg total) by mouth daily. Start tomorrow 4 tablet 0   . glucosamine-chondroitin 500-400 MG tablet Take 1 tablet by mouth 3 (three) times daily.     . predniSONE (DELTASONE) 20 MG tablet Take 2 tablets (40 mg total) by mouth daily. 6 tablet 0   . venlafaxine (EFFEXOR) 75 MG tablet Take 75 mg by mouth 2 (two) times daily.         Past History     Past Medical History:  Past Medical History   Diagnosis Date   . Cancer of breast, female    . Pulmonary embolism 2000       Past Surgical History:  Past  Surgical History   Procedure Laterality Date   . Mastectomy       left   . Breast lumpectomy       right   . Myomectomy         Family History:  History reviewed. No pertinent family history.    Social History:  History   Substance Use Topics   . Smoking status: Former Smoker -- 10 years     Types: Cigarettes     Quit date: 10/17/1997   . Smokeless tobacco: Not on file   . Alcohol Use: Yes      Comment: rare       Allergies:  Allergies   Allergen Reactions   . Ciprofloxacin Nausea Only       Review of Systems     CONSTITUTIONAL: No fevers or chills  HEENT: No vision changes +nasal congestion, +post nasal drip   RESPIRATORY: +cough  CARDIOVASCULAR: No chest pain  GI: No abdominal pain, no nausea, no vomiting   SKIN: No rash  MUSCULOSKELETAL: No back pain  NEURO: No Headache  ALL OTHER ROS NEGATIVE      Physical Exam   BP 161/70 mmHg  Pulse 74  Temp(Src) 99.5 F (37.5  C) (Oral)  Resp 18  Ht 1.778 m  Wt 86.183 kg  BMI 27.26 kg/m2  SpO2 97%    Vital signs have been reviewed  Constitutional: Well appearing, non toxic  HEAD: Normocephalic, atraumatic  EYES: PERRL, EOMI  ENT: Normal pharynx, TMs clear, normal airway  NECK: Supple, FROM, NT, no meningeal signs  CHEST: Normal chest excursion with RR 14.   CARDIOVASCULAR: Normal S1,S2, no murmurs, rubs, or gallops. Bilateral symmetric UE pulses   RESPIRATORY: Breath sounds clear and equal B, no wheezes, rhonchi, or rales. Speaks full sentences, normal airway, no stridor  GI: Normal bowel sounds, non distended, non tender, soft, no guarding or rebound  BACK: No evidence of trauma or deformity.  No tenderness along midline thoracic or lumbar spine  EXT: Normal pulses, no calf pain, edema, or asymmetry  SKIN: Warm and dry, no rash  NEURO: AxOx3, CN 2-12 intact, strength 5/5 B, sensation intact B.      Diagnostic Study Results     Labs -     Results    ** No results found for the last 24 hours. **          Radiologic Studies -   Radiology Results (24 Hour)    Procedure  Component Value Units Date/Time    Chest 2 Views [[id number]] Collected:  06/11/14 1410    Order Status:  Completed Updated:  06/11/14 1415    Narrative:      PA AND LATERAL CHEST    CLINICAL STATEMENT:  cough    COMPARISON: The study is compared to a prior performed on 11/22/2004.    FINDINGS: The lungs are clear. 2 surgical clips are noted overlying the  left lower chest.  The heart is at upper limits of normal in size and  there is tortuosity of the thoracic aorta. No significant osseous  abnormalities can be identified.      Impression:        No acute cardiopulmonary disease.    Fonnie Mu, MD   06/11/2014 2:11 PM        .      Medical Decision Making   I am the first provider for this patient.    I reviewed the vital signs, available nursing notes, past medical history, past surgical history, family history and social history.    Vital Signs-Reviewed the patient's vital signs.     Patient Vitals for the past 12 hrs:   BP Temp Pulse Resp   06/11/14 1332 161/70 mmHg 99.5 F (37.5 C) 74 18       Pulse Oximetry Analysis - Normal 97% on RA     Old Medical Records: Old medical records. Nursing notes.     ED Course:     2:28 PM Discussed discharge instructions and all test results with pt.  Patient feels better after alb MDI.  Hx not c/w with VTE.  URI symptoms and cough more c/w with bronchitis.  XR neg for infiltrate, so will rx with pred, alb, and zithromax given duration of symptoms.  Pt expresses understanding and agrees to follow up. All questions and concerns addressed at this time. Pt given Rx for Prednisone, Zithromax and inhaler.  Patient verbalizes understanding of return precautions and importance of follow up.      Provider Notes:        Diagnosis     Clinical Impression:   1. Cough    2. Bronchitis        _______________________________  Attestations:  This note is prepared by Wayland Denis, acting as Scribe for Enriqueta Shutter, MD.    Enriqueta Shutter, MD:  The scribe's documentation has been prepared  under my direction and personally reviewed by me in its entirety.  I confirm that the note above accurately reflects all work, treatment, procedures, and medical decision making performed by me.    _______________________________          Isaac Bliss, MD  06/14/14 [phone]

## 2014-11-02 DIAGNOSIS — L4050 Arthropathic psoriasis, unspecified: Secondary | ICD-10-CM

## 2014-11-02 HISTORY — DX: Arthropathic psoriasis, unspecified: L40.50

## 2015-10-09 ENCOUNTER — Emergency Department
Admission: EM | Admit: 2015-10-09 | Discharge: 2015-10-09 | Disposition: A | Discharge status: Home / Self Care | Payer: BLUE CROSS/BLUE SHIELD | Attending: Emergency Medicine | Admitting: Emergency Medicine

## 2015-10-09 ENCOUNTER — Emergency Department: Payer: BLUE CROSS/BLUE SHIELD

## 2015-10-09 DIAGNOSIS — Z853 Personal history of malignant neoplasm of breast: Secondary | ICD-10-CM | POA: Insufficient documentation

## 2015-10-09 DIAGNOSIS — Z87891 Personal history of nicotine dependence: Secondary | ICD-10-CM | POA: Insufficient documentation

## 2015-10-09 DIAGNOSIS — J069 Acute upper respiratory infection, unspecified: Secondary | ICD-10-CM | POA: Insufficient documentation

## 2015-10-09 DIAGNOSIS — Z86711 Personal history of pulmonary embolism: Secondary | ICD-10-CM | POA: Insufficient documentation

## 2015-10-09 MED ORDER — AZITHROMYCIN 250 MG PO TABS
250.0000 mg | ORAL_TABLET | Freq: Every day | ORAL | Status: AC
Start: 2015-10-09 — End: 2015-10-15

## 2015-10-09 MED ORDER — BENZONATATE 100 MG PO CAPS
100.0000 mg | ORAL_CAPSULE | Freq: Three times a day (TID) | ORAL | Status: DC | PRN
Start: 2015-10-09 — End: 2016-04-22

## 2015-10-09 NOTE — Discharge Instructions (Signed)
Upper Respiratory Infection     You have been diagnosed with a viral upper respiratory infection, often called a "URI."     The symptoms of a viral upper respiratory infection may include fever (temperature higher than 100.4ºF / 38ºC), runny nose, congestion and sinus fullness, facial pain, earache, sore throat, cough, and occasionally wheezing. The symptoms usually improve within 3 to 4 days. It might take up to 10 days before you feel completely better.     URI’s are treated with fluids, rest, and medication for fever and pain. Sometimes decongestants, cough medications or-medications for wheezing can also help.     Antibiotics have NO effect whatsoever on viruses and ARE NOT needed for a URI. Taking antibiotics when they are not necessary can cause side effects (like diarrhea or allergic reactions). It can also cause resistance, meaning antibiotics won’t work when you need them in the future.     You do not need to follow up with a doctor unless you feel worse or have other concerns.     YOU SHOULD SEEK MEDICAL ATTENTION IMMEDIATELY, EITHER HERE OR AT THE NEAREST EMERGENCY DEPARTMENT, IF ANY OF THE FOLLOWING OCCURS:  · You have new or worse symptoms or concerns.  · You are short of breath.  · You have a severe headache, stiff neck, confusion, or problems thinking.  · You have nasal discharge, fever (temperature higher than 100.4ºF / 38ºC), or productive cough (one that brings up mucous from the lungs) lasting more that 10 days. Occasionally a viral cold may lead into a bacterial infection, such as a sinus infection, ear infection or pneumonia. Taking antibiotics during the cold will NOT prevent these infections, but if a secondary infection occurs, you might require additional treatment.

## 2015-10-09 NOTE — ED Provider Notes (Signed)
EMERGENCY DEPARTMENT HISTORY AND PHYSICAL EXAM     Physician/Midlevel provider first contact with patient: 10/09/15 1406         Date: 10/09/2015  Patient Name: Yvette Anthony    History of Presenting Illness     Chief Complaint   Patient presents with   . Cough   . Sinus Pain       History Provided By: Patient    Chief Complaint: Congestion   Onset: 6 days ago   Timing: Persistent   Location: Head and chest congestion   Severity: Moderate  Exacerbating factors: None.   Alleviating factors: None.   Associated Symptoms: Productive cough, sore throat (since resolved)  Pertinent Negatives: Denies fever, chills or SOB.     Additional History: Yvette Anthony is a 64 y.o. female presenting to the ED with persistent head and chest congestion 6 days ago. Pt states that her symptoms started with a sore throat which she states has resolved. She also c/o of a cough productive of green/brown phlegm. Pt is a former smoker and breast cancer survivor. Pt takes Effexor daily. Denies fever, chills or SOB.     PCP: Yvette Anda, MD    No current facility-administered medications for this encounter.     Current Outpatient Prescriptions   Medication Sig Dispense Refill   . calcium citrate-vitamin D (CITRACAL+D) 315-200 MG-UNIT per tablet Take 1 tablet by mouth 2 (two) times daily.     Marland Kitchen glucosamine-chondroitin 500-400 MG tablet Take 1 tablet by mouth 3 (three) times daily.     . Omega-3 Fatty Acids (OMEGA-3 FISH OIL) 500 MG Cap Take by mouth.     . venlafaxine (EFFEXOR) 75 MG tablet Take 75 mg by mouth daily.        Marland Kitchen azithromycin (ZITHROMAX Z-PAK) 250 MG tablet Take 1 tablet (250 mg total) by mouth daily. 2 tablets on day one, then 1 tablet on days 2 through 5 6 tablet 0   . benzonatate (TESSALON PERLES) 100 MG capsule Take 1 capsule (100 mg total) by mouth 3 (three) times daily as needed. 15 capsule 0       Past History     Past Medical History:  Past Medical History   Diagnosis Date   . Pulmonary embolism 2000   . Cancer of breast,  female      chemo radiation        Past Surgical History:  Past Surgical History   Procedure Laterality Date   . Mastectomy       left   . Breast lumpectomy       right   . Myomectomy         Family History:  No family history on file.    Social History:  Social History   Substance Use Topics   . Smoking status: Former Smoker -- 10 years     Types: Cigarettes     Quit date: 10/17/1997   . Smokeless tobacco: None   . Alcohol Use: Yes      Comment: rare       Allergies:  Allergies   Allergen Reactions   . Ciprofloxacin Nausea Only       Review of Systems     Review of Systems   Constitutional: Negative for fever and chills.   HENT: Positive for congestion (Head and chest). Negative for sore throat.    Respiratory: Positive for cough (Productive green/brown). Negative for shortness of breath.  Physical Exam   BP 161/76 mmHg  Pulse 69  Temp(Src) 97.9 F (36.6 C) (Oral)  Resp 18  Ht 5\' 10"  (1.778 m)  Wt 86.183 kg  BMI 27.26 kg/m2  SpO2 97%    Physical Exam   Constitutional: Patient is oriented to person, place, and time and well-developed, well-nourished adult female, and in no distress.   Head: Normocephalic and atraumatic.   Eyes: EOM are normal. Pupils are equal, round, and reactive to light.   ENT: nares: erythematous, clear mucous discharge, ears: nl external canals, TMs, NLRB, Pharynx: tonsils nl, uvula midline, no lesions  Neck: Normal range of motion. Neck supple. No anterior cervical LNs.  Cardiovascular: Normal rate and regular rhythm.   Pulmonary/Chest: Few scattered rales and congestion. Effort normal and breath sounds normal. No respiratory distress.   Abdominal: Soft. There is no tenderness. Bowel sounds present and normal.No CVA tenderness  Musculoskeletal: Normal range of motion.   Neurological: Patient is alert and oriented to person, place, and time. GCS score is 15. No sensory deficits, No motor weakness, no facial weakness, no dysarthria, No upper extremity drift, DTRs 2+,   Skin:  Skin is warm and dry.     Diagnostic Study Results     Labs -     Results     ** No results found for the last 24 hours. **          Radiologic Studies -   Radiology Results (24 Hour)     ** No results found for the last 24 hours. **      .    Medical Decision Making   I am the first provider for this patient.    I reviewed the vital signs, available nursing notes, past medical history, past surgical history, family history and social history.    Vital Signs-Reviewed the patient's vital signs.     Patient Vitals for the past 12 hrs:   BP Temp Pulse Resp   10/09/15 1403 161/76 mmHg 97.9 F (36.6 C) 69 18       Pulse Oximetry Analysis - Normal 97% on RA    Old Medical Records: Old medical records. Nursing notes.     ED Course:   2:10 PM - Counseled pt on diagnosis, f/u plans, medication use, and signs and symptoms when to return to ED.  Pt is stable and ready for discharge.       Provider Notes:       Diagnosis     Clinical Impression:   1. Viral URI with cough        Treatment Plan:   ED Disposition     Discharge Yvette Anthony discharge to home/self care.    Condition at disposition: Stable              _______________________________      Attestations: This note is prepared by Virgel Manifold, acting as scribe for Docia Chuck, MD. The scribe's documentation has been prepared under my direction and personally reviewed by me in its entirety.  I confirm that the note above accurately reflects all work, treatment, procedures, and medical decision making performed by me.    _______________________________    Laren Boom, MD  10/09/15 [phone]

## 2015-10-09 NOTE — ED Notes (Signed)
head congestion, sore throat, malaise starting a week ago, coughing up greenish brown phlegm. Denies SOB

## 2015-12-30 ENCOUNTER — Ambulatory Visit
Admission: RE | Admit: 2015-12-30 | Discharge: 2015-12-30 | Disposition: A | Discharge status: Home / Self Care | Payer: Medicare Other | Source: Ambulatory Visit | Attending: Rheumatology | Admitting: Rheumatology

## 2015-12-30 ENCOUNTER — Other Ambulatory Visit: Payer: Self-pay | Admitting: Rheumatology

## 2015-12-30 DIAGNOSIS — R52 Pain, unspecified: Secondary | ICD-10-CM

## 2015-12-30 DIAGNOSIS — R609 Edema, unspecified: Secondary | ICD-10-CM

## 2015-12-30 DIAGNOSIS — M25741 Osteophyte, right hand: Secondary | ICD-10-CM | POA: Insufficient documentation

## 2015-12-30 DIAGNOSIS — M2560 Stiffness of unspecified joint, not elsewhere classified: Secondary | ICD-10-CM

## 2015-12-30 DIAGNOSIS — M25532 Pain in left wrist: Secondary | ICD-10-CM | POA: Insufficient documentation

## 2015-12-30 DIAGNOSIS — M19031 Primary osteoarthritis, right wrist: Secondary | ICD-10-CM | POA: Insufficient documentation

## 2015-12-30 DIAGNOSIS — M19071 Primary osteoarthritis, right ankle and foot: Secondary | ICD-10-CM | POA: Insufficient documentation

## 2015-12-30 DIAGNOSIS — M25674 Stiffness of right foot, not elsewhere classified: Secondary | ICD-10-CM | POA: Insufficient documentation

## 2015-12-30 DIAGNOSIS — M79671 Pain in right foot: Secondary | ICD-10-CM | POA: Insufficient documentation

## 2015-12-30 DIAGNOSIS — M19042 Primary osteoarthritis, left hand: Secondary | ICD-10-CM | POA: Insufficient documentation

## 2015-12-30 DIAGNOSIS — M25671 Stiffness of right ankle, not elsewhere classified: Secondary | ICD-10-CM | POA: Insufficient documentation

## 2015-12-30 DIAGNOSIS — M19041 Primary osteoarthritis, right hand: Secondary | ICD-10-CM | POA: Insufficient documentation

## 2015-12-30 DIAGNOSIS — M79641 Pain in right hand: Secondary | ICD-10-CM | POA: Insufficient documentation

## 2015-12-30 DIAGNOSIS — M25632 Stiffness of left wrist, not elsewhere classified: Secondary | ICD-10-CM | POA: Insufficient documentation

## 2015-12-30 DIAGNOSIS — M25641 Stiffness of right hand, not elsewhere classified: Secondary | ICD-10-CM | POA: Insufficient documentation

## 2015-12-30 DIAGNOSIS — M25642 Stiffness of left hand, not elsewhere classified: Secondary | ICD-10-CM | POA: Insufficient documentation

## 2015-12-30 DIAGNOSIS — M25531 Pain in right wrist: Secondary | ICD-10-CM | POA: Insufficient documentation

## 2015-12-30 DIAGNOSIS — R600 Localized edema: Secondary | ICD-10-CM | POA: Insufficient documentation

## 2015-12-30 DIAGNOSIS — M79642 Pain in left hand: Secondary | ICD-10-CM | POA: Insufficient documentation

## 2015-12-30 DIAGNOSIS — M25631 Stiffness of right wrist, not elsewhere classified: Secondary | ICD-10-CM | POA: Insufficient documentation

## 2015-12-30 DIAGNOSIS — M25571 Pain in right ankle and joints of right foot: Secondary | ICD-10-CM | POA: Insufficient documentation

## 2016-03-17 ENCOUNTER — Other Ambulatory Visit: Payer: Self-pay | Admitting: Rheumatology

## 2016-03-18 ENCOUNTER — Other Ambulatory Visit: Payer: Self-pay | Admitting: Rheumatology

## 2016-03-18 DIAGNOSIS — M2559 Pain in other specified joint: Secondary | ICD-10-CM

## 2016-03-30 ENCOUNTER — Ambulatory Visit: Payer: Medicare Other

## 2016-04-02 ENCOUNTER — Ambulatory Visit
Admission: RE | Admit: 2016-04-02 | Discharge: 2016-04-02 | Disposition: A | Discharge status: Home / Self Care | Payer: Medicare Other | Source: Ambulatory Visit | Attending: Rheumatology | Admitting: Rheumatology

## 2016-04-02 DIAGNOSIS — M2559 Pain in other specified joint: Secondary | ICD-10-CM

## 2016-04-02 DIAGNOSIS — M2550 Pain in unspecified joint: Secondary | ICD-10-CM | POA: Insufficient documentation

## 2016-04-02 DIAGNOSIS — M12841 Other specific arthropathies, not elsewhere classified, right hand: Secondary | ICD-10-CM | POA: Insufficient documentation

## 2016-04-22 ENCOUNTER — Emergency Department
Admission: EM | Admit: 2016-04-22 | Discharge: 2016-04-22 | Disposition: A | Discharge status: Home / Self Care | Payer: Medicare Other | Attending: Emergency Medicine | Admitting: Emergency Medicine

## 2016-04-22 ENCOUNTER — Emergency Department: Payer: Medicare Other

## 2016-04-22 ENCOUNTER — Emergency Department: Payer: BLUE CROSS/BLUE SHIELD

## 2016-04-22 DIAGNOSIS — W010XXA Fall on same level from slipping, tripping and stumbling without subsequent striking against object, initial encounter: Secondary | ICD-10-CM | POA: Insufficient documentation

## 2016-04-22 DIAGNOSIS — M533 Sacrococcygeal disorders, not elsewhere classified: Secondary | ICD-10-CM | POA: Insufficient documentation

## 2016-04-22 DIAGNOSIS — S0990XA Unspecified injury of head, initial encounter: Secondary | ICD-10-CM

## 2016-04-22 DIAGNOSIS — Z87891 Personal history of nicotine dependence: Secondary | ICD-10-CM | POA: Insufficient documentation

## 2016-04-22 MED ORDER — ACETAMINOPHEN 500 MG PO TABS
1000.0000 mg | ORAL_TABLET | Freq: Once | ORAL | Status: AC
Start: 2016-04-22 — End: 2016-04-22
  Administered 2016-04-22: 1000 mg via ORAL
  Filled 2016-04-22: qty 2

## 2016-04-22 MED ORDER — ONDANSETRON 4 MG PO TBDP
4.0000 mg | ORAL_TABLET | Freq: Four times a day (QID) | ORAL | Status: DC | PRN
Start: 2016-04-22 — End: 2017-07-26

## 2016-04-22 NOTE — ED Provider Notes (Signed)
EMERGENCY DEPARTMENT HISTORY AND PHYSICAL EXAM     Physician/Midlevel provider first contact with patient: 04/22/16 1204         Date: 04/22/2016  Patient Name: Yvette Anthony    History of Presenting Illness     Chief Complaint   Patient presents with   . Fall       History Provided By: Patient    Chief Complaint: Pain s/p fall  Onset: 2 hours ago   Timing: Intermittent  Location: Tailbone   Severity: Moderate  Quality: Ache  Exacerbating factors: None  Alleviating factors: None  Associated Symptoms: Headache, nausea  Pertinent Negatives: No LOC, vomiting    Additional History: Yvette Anthony is a 65 y.o. female presenting to the ED with tailbone pain s/p fall since 2 hours ago with associated posterior headache and nausea. Pt states that she slipped on a wet hardwood floor, landed on her tailbone, and hit her head. She notes occasional ASA use but she is not on any blood thinners currently. Pt denies LOC or vomiting. No neck pain, neck stiffness, abdominal pain, vision change, or leg pain. No limp.    PCP: Rudene Anda, MD    No current facility-administered medications for this encounter.     Current Outpatient Prescriptions   Medication Sig Dispense Refill   . calcium citrate-vitamin D (CITRACAL+D) 315-200 MG-UNIT per tablet Take 1 tablet by mouth 2 (two) times daily.     Marland Kitchen glucosamine-chondroitin 500-400 MG tablet Take 1 tablet by mouth 3 (three) times daily.     . Omega-3 Fatty Acids (OMEGA-3 FISH OIL) 500 MG Cap Take by mouth.     . ondansetron (ZOFRAN-ODT) 4 MG disintegrating tablet Take 1 tablet (4 mg total) by mouth every 6 (six) hours as needed for Nausea. 8 tablet 0   . venlafaxine (EFFEXOR) 75 MG tablet Take 75 mg by mouth daily.            Past History     Past Medical History:  Past Medical History   Diagnosis Date   . Pulmonary embolism 2000   . Cancer of breast, female      chemo radiation        Past Surgical History:  Past Surgical History   Procedure Laterality Date   . Mastectomy        left   . Breast lumpectomy       right   . Myomectomy         Family History:  History reviewed. No pertinent family history.    Social History:  Social History   Substance Use Topics   . Smoking status: Former Smoker -- 10 years     Types: Cigarettes     Quit date: 10/17/1997   . Smokeless tobacco: None   . Alcohol Use: Yes      Comment: rare       Allergies:  Allergies   Allergen Reactions   . Ciprofloxacin Nausea Only       Review of Systems     Review of Systems   Constitutional: Negative for fever and fatigue.   HENT: Negative for rhinorrhea and sore throat.    Eyes: Negative for discharge, redness and visual disturbance.   Respiratory: Negative for cough and shortness of breath.    Cardiovascular: Negative for chest pain and leg swelling.   Gastrointestinal: Positive for nausea. Negative for vomiting and abdominal pain.   Endocrine: Negative for polyuria.   Genitourinary: Negative for dysuria,  urgency, frequency and flank pain.   Musculoskeletal: Positive for myalgias (Tailbone). Negative for back pain, neck pain and neck stiffness.   Skin: Negative for rash.   Allergic/Immunologic: Negative for immunocompromised state.   Neurological: Positive for headaches. Negative for syncope and light-headedness.   Hematological: Does not bruise/bleed easily.   Psychiatric/Behavioral: Negative for suicidal ideas.       Physical Exam   BP 148/78 mmHg  Pulse 72  Temp(Src) 97.6 F (36.4 C) (Oral)  Resp 14  Ht 5\' 10"  (1.778 m)  Wt 86.183 kg  BMI 27.26 kg/m2  SpO2 96%    Constitutional: Vital signs reviewed. Well appearing. No distress.  Head: (+) tender to palpation on the L occipital region; (+) hematoma; no laceration; otherwise ormocephalic, atraumatic.  Eyes: Conjunctiva and sclera are normal.  No injection or discharge.  Ears, Nose, Throat:  Normal external examination of the nose and ears. No nasal septum hematoma or drainage from nose. No posterior auricular hematoma. No hemotympanum. Mucous membranes  moist.  Neck: Normal range of motion. No midline C-spine tenderness. Trachea midline.  Respiratory/Chest: Clear to auscultation. No respiratory distress.   Cardiovascular: Regular rate and rhythm. No murmurs.  Abdomen:  Bowel sounds intact. No rebound or guarding. Soft.  Non-tender.  Back: No midline CTL spine tenderness to palpation. (+) sacral tenderness. Stable pelvis. No hip tenderness.   Upper Extremity:  No edema. No cyanosis. Bilateral radial pulses intact and equal. No focal tenderness.   Lower Extremity:  No edema. No cyanosis. Bilateral calves symmetrical and non-tender. DP and PT pulses intact and equal in the bilateral lower extremities. No focal tenderness.  Skin: Warm and dry. No rash.  Neuro: A&Ox3. CNII -XII intact to testing. Strength 5/5 and symmetrical in the bilateral upper and lower extremities. Sensation to sharp touch intact and equal in the bilateral upper and lower extremities. Coordination intact to finger to nose testing. Normal gait.   Psychiatric:  Normal affect.  Normal insight.     Diagnostic Study Results     Labs -     Results     ** No results found for the last 24 hours. **          Radiologic Studies -   Radiology Results (24 Hour)     Procedure Component Value Units Date/Time    Sacrum and Coccyx [[id number]] Collected:  04/22/16 1314    Order Status:  Completed Updated:  04/22/16 1322    Narrative:      XR SACRUM AND COCCYX 2+ VIEWS    CLINICAL INDICATION:   Pain sacral pain s/p fall onto buttocks    TECHNIQUE: The following Radiographs obtained per protocol:  XR SACRUM  AND COCCYX 2+ VIEWS    FINDINGS:  Osseous structures are intact. Alignment well maintained. No  acute fracture. No periosteal irregularity.  No evidence for coccygeal  fracture.      Impression:       NO ACUTE ABNORMALITY.    Darnelle Maffucci, MD   04/22/2016 1:18 PM      CT Head without Contrast [[id number]] Collected:  04/22/16 1247    Order Status:  Completed Updated:  04/22/16 1252    Narrative:      CLINICAL  INDICATION:  Head trauma    TECHNIQUE:  Helical noncontrast CT images were obtained from the skull  base to the vertex. A combination of automated exposure control,  adjustment of the mA and/or kV according to patient size and/or use of  iterative  reconstruction technique was utilized.    COMPARISON:  None available    INTERPRETATION: Ventricles and sulcal pattern within normal limits for  age. No acute intracranial hemorrhage, extra-axial collection, or  mass-effect. Gray-white differentiation maintained. Soft tissue swelling  of the scalp in the left posterior parietal and left occipital region.  Visualized paranasal sinuses and mastoid air cells clear. No acute  fracture.      Impression:        Left posterior scalp injury without acute intracranial  abnormality.    Mitali  Bapna, MD   04/22/2016 12:48 PM        .    Medical Decision Making   I am the first provider for this patient.    I reviewed the vital signs, available nursing notes, past medical history, past surgical history, family history and social history.    Vital Signs-Reviewed the patient's vital signs.     Patient Vitals for the past 12 hrs:   BP Temp Pulse Resp   04/22/16 1338 148/78 mmHg 97.6 F (36.4 C) 72 14   04/22/16 1153 159/76 mmHg 98 F (36.7 C) 78 18       Pulse Oximetry Analysis - Normal 96% on RA    Old Medical Records: Nursing notes.   Old records.    ED Course:     12:11 PM - Pt is agreeable with ED plan including XR. Discussed using ice for relief.     1:25 PM - Pt in NAD. Repeat neuro exam stable. Continues to ambulate without difficulty. Reviewed all results with pt. She is comfortable with discharge. Will f/u with PCP in 1-2 days and neuro if headache symptoms persist. Reviewed concussion care instructions. Reviewed red flags for return to ED and pt voiced understanding.        Diagnosis     Clinical Impression:   1. Closed head injury, initial encounter    2. Sacral pain        Treatment Plan:   ED Disposition     Discharge  Karson Yetta Barre discharge to home/self care.    Condition at disposition: Stable              _______________________________      Attestations: This note is prepared by Annia Belt, acting as scribe for Lynnea Ferrier, MD.    Lynnea Ferrier, MD - The scribe's documentation has been prepared under my direction and personally reviewed by me in its entirety.  I confirm that the note above accurately reflects all work, treatment, procedures, and medical decision making performed by me.    _______________________________      Maryella Shivers, MD  04/22/16 [phone]

## 2016-08-24 DIAGNOSIS — L603 Nail dystrophy: Secondary | ICD-10-CM | POA: Insufficient documentation

## 2016-11-02 HISTORY — PX: INGUINAL HERNIA REPAIR: SUR1180

## 2017-05-24 ENCOUNTER — Ambulatory Visit (INDEPENDENT_AMBULATORY_CARE_PROVIDER_SITE_OTHER): Payer: Medicare Other | Admitting: Neurology

## 2017-05-24 ENCOUNTER — Encounter (INDEPENDENT_AMBULATORY_CARE_PROVIDER_SITE_OTHER): Payer: Self-pay | Admitting: Neurology

## 2017-05-24 VITALS — BP 129/80 | HR 71

## 2017-05-24 DIAGNOSIS — R200 Anesthesia of skin: Secondary | ICD-10-CM

## 2017-05-24 DIAGNOSIS — R251 Tremor, unspecified: Secondary | ICD-10-CM

## 2017-05-24 MED ORDER — GABAPENTIN 300 MG PO CAPS
300.0000 mg | ORAL_CAPSULE | Freq: Three times a day (TID) | ORAL | 2 refills | Status: DC
Start: 2017-05-24 — End: 2017-07-26

## 2017-05-24 NOTE — Patient Instructions (Signed)
[facility]  [address], [address], [place], [place] [address]  Tele: [phone]; Fax: [phone]

## 2017-05-24 NOTE — Progress Notes (Signed)
[email or web address]    Subjective:      CC: Tremor    66 y.o.  F presenting to neurology clinic for reduction of tremors.  Patient states that she has had tremors in her hands for years, but over the last year or so they have gotten much worse.  She feels like the tremors occur all the time in her hands and legs, and when she is lying down at night.  She feels like her whole body is shaking.  She was previously diagnosed with essential tremor.  She was tried on propranolol, but it did not seem to help at all.  She was tried on primidone, but even a low dose made her feel sick.  She is taking topiramate for weight loss, but it does not seem to help her tremor.  She has some stiffness in her right shoulder, and she thinks this is from her psoriatic arthritis.  No other notable stiffness in arms and legs.  No change in gait, no significant instability.  No falls.  She does have long-standing constipation, and works with a gastroenterologist.  Occasional neck pain and back pain, but no shooting pain down arms or legs.  She does have occasional numbness in her left leg, simply in the left foot.  Denies any weakness in her arms and legs.    Denies anosmia.  No RBD.  She does often have trouble sleeping and wakes up overnight many nights to go to the restroom.  Denies any memory loss.  Denies hallucinations.     Patient denies smoking, drinks occasionally.  She has not noticed that alcohol makes any change to her tremors.  No drug use.  Her father had Parkinson's disease and is deceased. No other family history of tremors.    The following portions of the patient's history were reviewed and updated as appropriate: allergies, current medications, past family history, past medical history, past social history, past surgical history and problem list.    Review of Systems  Per HPI.  No recent illness.  Denies fever, chills, cough, sinus pain, eye pain, eye redness, ear pain, rhinorrhea, sore throat, chest  pain, SOB, wheezing, abd pain, Nausea, Vomiting, diarrhea,  dysuria, or rashes.  All other ROS negative.       Objective:     BP 129/80 (BP Site: Left arm)   Pulse 71     Constitutional: NAD   Eyes: Clear conjunctiva  Neck: Nontender, full ROM, No Lhermitte's sign  Cardiovascular: RRR  Resipratory: CTA B   Musculoskeletal: Normal muscle bulk.  No muscle pain  Integumentary: No abnormal rash noted  Psych: Normal affect    Neurological:   Mental Status: Alert & oriented to person, place, month, & year.   Language: Spontaneous & fluent speech, good comprehension.    Cranial Nerves:  II, III: PERRL, VFF  III, IV, VI: EOMI, No nystagmus, no palsies, no ptosis  V: Intact to LT V1-V3 distribution bilaterally.   VI: Symmetrical face and expression.   VIII: Hearing intact to finger rub bilaterally.   IX, X: Palate/Uvula elevates symmetrically.   XI: 5/5 Trapezius & SCM bilaterally.   XII: Tongue is midline.     Motor Exam: Normal bulk and tone. No clonus. No pronator drift. Strength 5/5 throughout and symmetric.No resting tremor, postural tremor bilaterally, slightly worse on right.  Normal fine motor movements, no definite increase in tone and no definite cogwheeling.    Sensation: Sensation to LT intact grossly throughout symmetrically. Romberg negative  Cerebellar:   Finger to Nose: Mild postural intention tremor bilaterally.  No dysmetria.    Reflexes: 2+ throughout, symmetric, with down-going toes.    Station/ Gait: Normal with an stride length.  Decreased arm swing with some re-emergent tremor on right.      Assessment:     The majority of her history and exam is consistent with essential tremor, but this would not explain the general sense of jitteriness and discomfort in her legs.  We will get an EMG/NCS to see if there is a neuropathy or radiculopathy exacerbating the symptoms.    Additionally, she does have psoriatic arthritis, which could affect some of this movement of her right arm.  However, on exam, the  decreased arm swing.  Re-emergent tremor in the right arm would also be atypical for essential tremor, and may suggest this is in fact early Parkinson's disease.  This is even more concerning giving her family history of Parkinson's disease.    Plan:     EMG/NCS  Try gabapentin to see if it helps with tremor and/or possibly neuropathic pain versus restless leg syndrome.    We will get a DAT scan to differentiate essential tremor for Parkinson's disease.    RTC in 2 months. Patient can follow up sooner if needed. In the meantime, patient will contact the office with any questions or concerns.     -----------------------------------------------------------    Additional notes and data scanned including patient questionnaire which may contain pertinent information to visit.         Kateri Plummer, MD, PhD  Co-Director  Movement Disorders Specialist  [facility]  [facility]    [address]  [address]  [place] [address]    [place], [place] [address]  T [phone]  F [phone]   T [phone]  F [phone]     [email or web address]

## 2017-05-28 ENCOUNTER — Encounter (INDEPENDENT_AMBULATORY_CARE_PROVIDER_SITE_OTHER): Payer: BLUE CROSS/BLUE SHIELD

## 2017-06-01 ENCOUNTER — Encounter (INDEPENDENT_AMBULATORY_CARE_PROVIDER_SITE_OTHER): Payer: BLUE CROSS/BLUE SHIELD

## 2017-06-03 ENCOUNTER — Encounter (INDEPENDENT_AMBULATORY_CARE_PROVIDER_SITE_OTHER): Payer: Self-pay

## 2017-06-07 ENCOUNTER — Ambulatory Visit (INDEPENDENT_AMBULATORY_CARE_PROVIDER_SITE_OTHER): Payer: Medicare Other | Admitting: Neurology

## 2017-06-07 DIAGNOSIS — R200 Anesthesia of skin: Secondary | ICD-10-CM

## 2017-06-11 ENCOUNTER — Ambulatory Visit (INDEPENDENT_AMBULATORY_CARE_PROVIDER_SITE_OTHER): Payer: Medicare Other | Admitting: Neurology

## 2017-06-11 DIAGNOSIS — R200 Anesthesia of skin: Secondary | ICD-10-CM

## 2017-06-22 NOTE — Procedures (Signed)
Reason for Study:   The patient is being evaluated for right and left leg numbness and tingling.    Summary:   The left sural sensory nerve action potential amplitude was low.  Otherwise, the nerve conduction studies of the right and left leg were normal.  The needle exam of the right leg was normal.    Interpretation: The EMG of the right and left leg was essentially normal.    ________________________________________   Charlynn Court, MD   Board Certified, American Academy of Neurology

## 2017-06-24 ENCOUNTER — Other Ambulatory Visit (INDEPENDENT_AMBULATORY_CARE_PROVIDER_SITE_OTHER): Payer: Self-pay | Admitting: Neurology

## 2017-06-24 DIAGNOSIS — G20 Parkinson's disease: Secondary | ICD-10-CM

## 2017-06-24 MED ORDER — ROTIGOTINE 2 MG/24HR TD PT24
1.0000 | MEDICATED_PATCH | Freq: Every day | TRANSDERMAL | 3 refills | Status: DC
Start: 2017-06-24 — End: 2017-12-06

## 2017-06-29 NOTE — Progress Notes (Signed)
Subjective:       Patient ID: Yvette Anthony is a 66 y.o. female.    HPI    66 year old female here for EMG to further evaluate right and left leg numbness and tingling.  She reports symptoms are worse in her left leg, particularly her left foot.  She denies weakness, bowel or bladder incontinence, gait stability, falls.    She follows with Dr. Aundria Rud for treatment of her tremors.    Review of Systems        Objective:    Physical Exam  On exam, the patient's mental status appeared normal. Speech showed appropriate content, was fluent, and the patient followed commands. Attention and concentration appeared normal. Cranial nerves showed pupils equal round and reactive bilaterally, full extraocular movements, midline tongue, and a symmetric face. Motor exam showed good movement in all extremities without obvious focality or weakness. Sensation was intact to light touch. No tremors. Gait was stable.        Assessment:       Referred EMG report.  She will follow-up with Dr. Aundria Rud in the next few weeks.      Plan:      Procedures  Orders Placed This Encounter   Procedures   . OTHER RADIOLOGY SCANS - [id number] SS

## 2017-07-07 ENCOUNTER — Ambulatory Visit (INDEPENDENT_AMBULATORY_CARE_PROVIDER_SITE_OTHER): Payer: Medicare Other | Admitting: Neurology

## 2017-07-07 ENCOUNTER — Encounter (INDEPENDENT_AMBULATORY_CARE_PROVIDER_SITE_OTHER): Payer: Self-pay | Admitting: Neurology

## 2017-07-07 VITALS — BP 149/74 | HR 80 | Ht 70.0 in | Wt 172.0 lb

## 2017-07-07 DIAGNOSIS — R251 Tremor, unspecified: Secondary | ICD-10-CM

## 2017-07-07 DIAGNOSIS — R200 Anesthesia of skin: Secondary | ICD-10-CM

## 2017-07-07 DIAGNOSIS — G20 Parkinson's disease: Secondary | ICD-10-CM

## 2017-07-07 NOTE — Patient Instructions (Signed)
Begin taking the following for constipation. Mix together:  - 1 cup of bran  - 1 cup of applesauce  - 1 cup of prune juice    Take 2 tablespoons every day.

## 2017-07-07 NOTE — Progress Notes (Signed)
[email or web address]    Subjective:      CC: Tremor    66 y.o.  F presenting to neurology clinic for f/u of tremors.  She started the Neupro patch after receiving word about the positive DAT scan.  She is taking the 2 mg patch daily.  She has not noticed any changes, but denies any side effects.  No nausea, drowsiness, dizziness, sleep attacks, impulsivity, compulsivity, or leg edema.  She still feels somewhat shaky in her hands, and when standing.  Feels shakiness in her legs.  No significant numbness or weakness in her legs.  Mildly unstable walking, but no falls.  Having a bit more constipation as well.  He has also been having some vivid dreams.  She is not sure if she has RBD, but she thinks she moved herself up at night.    No other new symptoms or complaints.        Patient denies smoking, drinks occasionally.  She has not noticed that alcohol makes any change to her tremors.  No drug use.  Her father had Parkinson's disease and is deceased. No other family history of tremors.    Retained from initial Consultation:   Patient states that she has had tremors in her hands for years, but over the last year or so they have gotten much worse.  She feels like the tremors occur all the time in her hands and legs, and when she is lying down at night.  She feels like her whole body is shaking.  She was previously diagnosed with essential tremor.  She was tried on propranolol, but it did not seem to help at all.  She was tried on primidone, but even a low dose made her feel sick.  She is taking topiramate for weight loss, but it does not seem to help her tremor.  She has some stiffness in her right shoulder, and she thinks this is from her psoriatic arthritis.  No other notable stiffness in arms and legs.  No change in gait, no significant instability.  No falls.  She does have long-standing constipation, and works with a gastroenterologist.  Occasional neck pain and back pain, but no shooting pain down  arms or legs.  She does have occasional numbness in her left leg, simply in the left foot.  Denies any weakness in her arms and legs.    Denies anosmia.  No RBD.  She does often have trouble sleeping and wakes up overnight many nights to go to the restroom.  Denies any memory loss.  Denies hallucinations.         The following portions of the patient's history were reviewed and updated as appropriate: allergies, current medications, past family history, past medical history, past social history, past surgical history and problem list.    Review of Systems  Per HPI.  No recent illness.  Denies fever, chills, cough, sinus pain, eye pain, eye redness, ear pain, rhinorrhea, sore throat, chest pain, SOB, wheezing, abd pain, Nausea, Vomiting, diarrhea,  dysuria, or rashes.  All other ROS negative.       Objective:     BP 149/74   Pulse 80   Ht 1.778 m (5\' 10" )   Wt 78 kg (172 lb)   BMI 24.68 kg/m     Constitutional: NAD   Eyes: Clear conjunctiva  Neck: Nontender, full ROM, No Lhermitte's sign  Cardiovascular: RRR  Resipratory: CTA B   Musculoskeletal: Normal muscle bulk.  No muscle pain  Integumentary: No  abnormal rash noted  Psych: Normal affect    Neurological:   Mental Status: Alert & oriented to person, place, month, & year.   Language: Spontaneous & fluent speech, good comprehension.    Cranial Nerves:  II, III: PERRL, VFF  III, IV, VI: EOMI, No nystagmus, no palsies, no ptosis  V: Intact to LT V1-V3 distribution bilaterally.   VI: Symmetrical face and expression.   VIII: Hearing intact to finger rub bilaterally.   IX, X: Palate/Uvula elevates symmetrically.   XI: 5/5 Trapezius & SCM bilaterally.   XII: Tongue is midline.     Motor Exam: Normal bulk and tone. No clonus. No pronator drift. Strength 5/5 throughout and symmetric.No resting tremor, postural tremor bilaterally, slightly worse on right.  Normal fine motor movements, no definite increase in tone and no definite cogwheeling.    Sensation: Sensation to  LT intact grossly throughout symmetrically. Romberg negative    Cerebellar:   Finger to Nose: Mild postural intention tremor bilaterally.  No dysmetria.    Reflexes: 2+ throughout, symmetric, with down-going toes.    Station/ Gait: Normal with an stride length.  Decreased arm swing with some re-emergent tremor on right.      Assessment:     Patient with long-standing tremors and possible restless leg syndrome.  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  She was started on Neupro, and is tolerating it well.  She still having the uncomfortable shaky feeling in her legs, we will likely need to increase to 4 mg soon to see if this helps.  EMG/NCS was normal w/ no evidence of neuropathy or radiculopathy.  Plan:     Continue Neupro 2 mg daily, plan to increase to 4 mg daily.  Provided recipe for constipation cocktail.     RTC in 2 months. Patient can follow up sooner if needed. In the meantime, patient will contact the office with any questions or concerns.     -----------------------------------------------------------    Additional notes and data scanned including patient questionnaire which may contain pertinent information to visit.         Kateri Plummer, MD, PhD  Co-Director  Movement Disorders Specialist  [facility]  [facility]    [address]  [address]  [place] [address]    [place], [place] [address]  T [phone]  F [phone]   T [phone]  F [phone]     [email or web address]

## 2017-07-26 ENCOUNTER — Ambulatory Visit: Payer: Medicare Other

## 2017-07-26 ENCOUNTER — Encounter (INDEPENDENT_AMBULATORY_CARE_PROVIDER_SITE_OTHER): Payer: Self-pay

## 2017-07-26 ENCOUNTER — Other Ambulatory Visit (INDEPENDENT_AMBULATORY_CARE_PROVIDER_SITE_OTHER): Payer: Self-pay

## 2017-07-26 DIAGNOSIS — M24573 Contracture, unspecified ankle: Secondary | ICD-10-CM | POA: Insufficient documentation

## 2017-07-26 DIAGNOSIS — G20 Parkinson's disease: Secondary | ICD-10-CM

## 2017-07-26 DIAGNOSIS — Z01818 Encounter for other preprocedural examination: Secondary | ICD-10-CM

## 2017-07-26 DIAGNOSIS — M7740 Metatarsalgia, unspecified foot: Secondary | ICD-10-CM | POA: Insufficient documentation

## 2017-07-26 MED ORDER — ROTIGOTINE 4 MG/24HR TD PT24
1.0000 | MEDICATED_PATCH | Freq: Every day | TRANSDERMAL | 3 refills | Status: DC
Start: 2017-07-26 — End: 2017-10-08

## 2017-07-26 NOTE — Pre-Procedure Instructions (Signed)
_______________INTERVIEW INFORMATION_____________   Completed PSS interview with the patient  Yvette Anthony, Yvette Anthony Mar 06, 1951 [id number]    On 07/26/17 at 1:21 PM   for their procedure Procedure(s) with comments:   HERNIORRHAPHY, INGUINAL - RIGHT INGUINAL HERNIA OPEN   Currently scheduled for 08/13/2017  0830    with Verneda Skill, MD at [facility]   [facility] PROFESSION[facility]   [address]   [place] [address]   Loc: [phone]   Arrival Time Verified: Yes (0700 read back)  NPO Status Reinforced: NPO per anesthesia guidelines (8, 6, 4) (mn read back)   [facility] Entrance (Red Entrance for [employer] or Wheelchair Assistance)  _______________NAVIGATOR COMMUNICATION_________  TESTING INDICATED  Anesthesia Guidelines-   None    Surgeon Requested-   Cbc, bmp, ekg  TESTING IN EPIC   07/07/17 neuro notes ([facility])  OUTSIDE TESTING COMPLETED   none  FUTURE TESTING/APPOINTMENTS   08/09/17 Pt coming to PSS has orders in hand for:cbc, basic, ekg  RECORDS REQUESTED/NOTIFICATIONS MADE   Requested none   Notified none

## 2017-08-09 ENCOUNTER — Ambulatory Visit: Payer: Medicare Other | Attending: Surgery

## 2017-08-09 DIAGNOSIS — Z8719 Personal history of other diseases of the digestive system: Secondary | ICD-10-CM

## 2017-08-09 DIAGNOSIS — Z9889 Other specified postprocedural states: Secondary | ICD-10-CM | POA: Insufficient documentation

## 2017-08-09 LAB — CBC
Absolute NRBC: 0 10*3/uL
Hematocrit: 40.4 % (ref 37.0–47.0)
Hgb: 13.4 g/dL (ref 12.0–16.0)
MCH: 34.1 pg — ABNORMAL HIGH (ref 28.0–32.0)
MCHC: 33.2 g/dL (ref 32.0–36.0)
MCV: 102.8 fL — ABNORMAL HIGH (ref 80.0–100.0)
MPV: 10 fL (ref 9.4–12.3)
Nucleated RBC: 0 /100 WBC (ref 0.0–1.0)
Platelets: 246 10*3/uL (ref 140–400)
RBC: 3.93 10*6/uL — ABNORMAL LOW (ref 4.20–5.40)
RDW: 13 % (ref 12–15)
WBC: 3.77 10*3/uL (ref 3.50–10.80)

## 2017-08-09 LAB — BASIC METABOLIC PANEL
BUN: 15 mg/dL (ref 7.0–19.0)
CO2: 26 mEq/L (ref 21–29)
Calcium: 9.7 mg/dL (ref 8.5–10.5)
Chloride: 106 mEq/L (ref 100–111)
Creatinine: 0.8 mg/dL (ref 0.4–1.5)
Glucose: 92 mg/dL (ref 70–100)
Potassium: 4.4 mEq/L (ref 3.5–5.1)
Sodium: 137 mEq/L (ref 136–145)

## 2017-08-09 LAB — ECG 12-LEAD
Atrial Rate: 68 {beats}/min
P Axis: 44 degrees
P-R Interval: 170 ms
Q-T Interval: 398 ms
QRS Duration: 88 ms
QTC Calculation (Bezet): 423 ms
R Axis: 45 degrees
T Axis: 50 degrees
Ventricular Rate: 68 {beats}/min

## 2017-08-09 LAB — GFR: EGFR: >60

## 2017-08-09 LAB — HEMOLYSIS INDEX: Hemolysis Index: 13 (ref 0–18)

## 2017-08-12 NOTE — H&P (Signed)
Yvette Anthony is an 66 y.o. female with right groin bulge which remains asymptomatic.    Past Medical History:   Diagnosis Date   . Breast cancer 2000    Left-no chemo//no  radiation    . Breast cancer 2005    right- +chemo LD 2005// + radiation LD 2006   . Inguinal hernia, right    . Post-operative nausea and vomiting    . Psoriatic arthritis 2016   . Pulmonary embolism 2000    post-op mastectomy       Allergies:   Allergies   Allergen Reactions   . Ciprofloxacin Nausea Only       Active Problems:    * No active hospital problems. *    Height 1.778 m (5\' 10" ), weight 77.1 kg (170 lb).    Review of Systems   All other systems reviewed and are negative.      Physical Exam   Constitutional: She is oriented to person, place, and time. She appears well-developed and well-nourished.   HENT:   Head: Normocephalic and atraumatic.   Eyes: Pupils are equal, round, and reactive to light. EOM are normal.   Neck: Normal range of motion. Neck supple.   Cardiovascular: Normal rate, regular rhythm, normal heart sounds and intact distal pulses.    Pulmonary/Chest: Effort normal and breath sounds normal.   Abdominal: Soft. Bowel sounds are normal. A hernia is present. Hernia confirmed positive in the right inguinal area.   Neurological: She is alert and oriented to person, place, and time.       Assessment:  Right inguinal hernia    Plan:  Surgical repair    Verneda Skill  08/12/2017

## 2017-08-13 ENCOUNTER — Ambulatory Visit
Admission: RE | Admit: 2017-08-13 | Discharge: 2017-08-13 | Disposition: A | Discharge status: Home / Self Care | Payer: Medicare Other | Source: Ambulatory Visit | Attending: Surgery | Admitting: Surgery

## 2017-08-13 ENCOUNTER — Encounter: Payer: Self-pay | Admitting: Anesthesiology

## 2017-08-13 ENCOUNTER — Ambulatory Visit: Payer: Medicare Other | Admitting: Certified Registered Nurse Anesthetist

## 2017-08-13 ENCOUNTER — Encounter
Admission: RE | Disposition: A | Discharge status: Home / Self Care | Payer: Self-pay | Source: Ambulatory Visit | Attending: Surgery

## 2017-08-13 ENCOUNTER — Other Ambulatory Visit: Payer: Self-pay

## 2017-08-13 DIAGNOSIS — K4090 Unilateral inguinal hernia, without obstruction or gangrene, not specified as recurrent: Secondary | ICD-10-CM | POA: Insufficient documentation

## 2017-08-13 DIAGNOSIS — L4050 Arthropathic psoriasis, unspecified: Secondary | ICD-10-CM | POA: Insufficient documentation

## 2017-08-13 HISTORY — DX: Other specified postprocedural states: Z98.890

## 2017-08-13 SURGERY — HERNIORRHAPHY, INGUINAL
Anesthesia: Anesthesia General | Laterality: Right | Wound class: Clean

## 2017-08-13 MED ORDER — OXYCODONE HCL 5 MG PO TABS
5.0000 mg | ORAL_TABLET | Freq: Once | ORAL | Status: AC | PRN
Start: 2017-08-13 — End: 2017-08-13

## 2017-08-13 MED ORDER — DEXAMETHASONE SODIUM PHOSPHATE 20 MG/5ML IJ SOLN
INTRAMUSCULAR | Status: AC
Start: 2017-08-13 — End: ?
  Filled 2017-08-13: qty 5

## 2017-08-13 MED ORDER — OXYCODONE HCL 5 MG PO TABS
ORAL_TABLET | ORAL | Status: AC
Start: 2017-08-13 — End: 2017-08-13
  Administered 2017-08-13: 5 mg via ORAL
  Filled 2017-08-13: qty 1

## 2017-08-13 MED ORDER — KETOROLAC TROMETHAMINE 60 MG/2ML IM SOLN
INTRAMUSCULAR | Status: AC
Start: 2017-08-13 — End: ?
  Filled 2017-08-13: qty 2

## 2017-08-13 MED ORDER — FAMOTIDINE 10 MG/ML IV SOLN (WRAP)
INTRAVENOUS | Status: DC | PRN
Start: 2017-08-13 — End: 2017-08-13
  Administered 2017-08-13: 20 mg via INTRAVENOUS

## 2017-08-13 MED ORDER — METOCLOPRAMIDE HCL 5 MG/ML IJ SOLN
10.0000 mg | Freq: Once | INTRAMUSCULAR | Status: DC | PRN
Start: 2017-08-13 — End: 2017-08-13

## 2017-08-13 MED ORDER — OXYCODONE-ACETAMINOPHEN 5-325 MG PO TABS
ORAL_TABLET | ORAL | 0 refills | Status: DC
Start: 2017-08-13 — End: 2018-01-14
  Filled 2017-08-13: qty 15, 3d supply, fill #0

## 2017-08-13 MED ORDER — LIDOCAINE HCL 2 % IJ SOLN
INTRAMUSCULAR | Status: DC | PRN
Start: 2017-08-13 — End: 2017-08-13
  Administered 2017-08-13: 60 mg

## 2017-08-13 MED ORDER — BUPIVACAINE-EPINEPHRINE (PF) 0.5% -1:200000 IJ SOLN
INTRAMUSCULAR | Status: DC | PRN
Start: 2017-08-13 — End: 2017-08-13
  Administered 2017-08-13: 10 mL

## 2017-08-13 MED ORDER — ACETAMINOPHEN 500 MG PO TABS
ORAL_TABLET | ORAL | Status: AC
Start: 2017-08-13 — End: 2017-08-13
  Administered 2017-08-13: 1000 mg via ORAL
  Filled 2017-08-13: qty 2

## 2017-08-13 MED ORDER — ONDANSETRON HCL 4 MG/2ML IJ SOLN
INTRAMUSCULAR | Status: DC | PRN
Start: 2017-08-13 — End: 2017-08-13
  Administered 2017-08-13: 4 mg via INTRAVENOUS

## 2017-08-13 MED ORDER — GLYCOPYRROLATE 0.2 MG/ML IJ SOLN
INTRAMUSCULAR | Status: DC | PRN
Start: 2017-08-13 — End: 2017-08-13
  Administered 2017-08-13: 0.2 mg via INTRAVENOUS

## 2017-08-13 MED ORDER — LIDOCAINE-EPINEPHRINE 1 %-1:100000 IJ SOLN
INTRAMUSCULAR | Status: DC | PRN
Start: 2017-08-13 — End: 2017-08-13
  Administered 2017-08-13: 10 mL

## 2017-08-13 MED ORDER — GABAPENTIN 100 MG PO CAPS
200.0000 mg | ORAL_CAPSULE | Freq: Once | ORAL | Status: AC
Start: 2017-08-13 — End: 2017-08-13

## 2017-08-13 MED ORDER — ACETAMINOPHEN 500 MG PO TABS
1000.0000 mg | ORAL_TABLET | Freq: Once | ORAL | Status: AC
Start: 2017-08-13 — End: 2017-08-13

## 2017-08-13 MED ORDER — KETOROLAC TROMETHAMINE 30 MG/ML IJ SOLN
INTRAMUSCULAR | Status: DC | PRN
Start: 2017-08-13 — End: 2017-08-13
  Administered 2017-08-13: 15 mg via INTRAVENOUS

## 2017-08-13 MED ORDER — FENTANYL CITRATE (PF) 50 MCG/ML IJ SOLN (WRAP)
25.0000 ug | INTRAMUSCULAR | Status: DC | PRN
Start: 2017-08-13 — End: 2017-08-13

## 2017-08-13 MED ORDER — LIDOCAINE HCL (PF) 2 % IJ SOLN
INTRAMUSCULAR | Status: AC
Start: 2017-08-13 — End: ?
  Filled 2017-08-13: qty 5

## 2017-08-13 MED ORDER — PHENYLEPHRINE 100 MCG/ML IN NACL 0.9% IV SOSY
PREFILLED_SYRINGE | INTRAVENOUS | Status: AC
Start: 2017-08-13 — End: ?
  Filled 2017-08-13: qty 5

## 2017-08-13 MED ORDER — ONDANSETRON HCL 4 MG/2ML IJ SOLN
INTRAMUSCULAR | Status: AC
Start: 2017-08-13 — End: ?
  Filled 2017-08-13: qty 2

## 2017-08-13 MED ORDER — LACTATED RINGERS IV SOLN
INTRAVENOUS | Status: DC
Start: 2017-08-13 — End: 2017-08-13

## 2017-08-13 MED ORDER — SCOPOLAMINE 1 MG/3DAYS TD PT72
1.0000 | MEDICATED_PATCH | TRANSDERMAL | Status: DC
Start: 2017-08-13 — End: 2017-08-13

## 2017-08-13 MED ORDER — LACTATED RINGERS IV SOLN
INTRAVENOUS | Status: DC
Start: 2017-08-13 — End: 2017-08-13
  Administered 2017-08-13: 1000 mL via INTRAVENOUS

## 2017-08-13 MED ORDER — EPHEDRINE SULFATE 50 MG/ML IJ/IV SOLN (WRAP)
Status: DC | PRN
Start: 2017-08-13 — End: 2017-08-13
  Administered 2017-08-13 (×3): 5 mg via INTRAVENOUS

## 2017-08-13 MED ORDER — ACETAMINOPHEN 325 MG PO TABS
650.0000 mg | ORAL_TABLET | Freq: Once | ORAL | Status: DC | PRN
Start: 2017-08-13 — End: 2017-08-13

## 2017-08-13 MED ORDER — EPHEDRINE SULFATE 50 MG/ML IJ/IV SOLN (WRAP)
Status: AC
Start: 2017-08-13 — End: ?
  Filled 2017-08-13: qty 1

## 2017-08-13 MED ORDER — GABAPENTIN 100 MG PO CAPS
ORAL_CAPSULE | ORAL | Status: AC
Start: 2017-08-13 — End: 2017-08-13
  Administered 2017-08-13: 200 mg via ORAL
  Filled 2017-08-13: qty 2

## 2017-08-13 MED ORDER — PROPOFOL 10 MG/ML IV EMUL (WRAP)
INTRAVENOUS | Status: DC | PRN
Start: 2017-08-13 — End: 2017-08-13
  Administered 2017-08-13: 200 mg via INTRAVENOUS

## 2017-08-13 MED ORDER — HYDROMORPHONE HCL 0.5 MG/0.5 ML IJ SOLN
0.5000 mg | INTRAMUSCULAR | Status: DC | PRN
Start: 2017-08-13 — End: 2017-08-13

## 2017-08-13 MED ORDER — DIPHENHYDRAMINE HCL 50 MG/ML IJ SOLN
25.0000 mg | Freq: Once | INTRAMUSCULAR | Status: DC | PRN
Start: 2017-08-13 — End: 2017-08-13

## 2017-08-13 MED ORDER — SODIUM CHLORIDE 0.9 % IR SOLN
Status: DC | PRN
Start: 2017-08-13 — End: 2017-08-13
  Administered 2017-08-13: 1000 mL

## 2017-08-13 MED ORDER — DEXAMETHASONE SODIUM PHOSPHATE 4 MG/ML IJ SOLN (WRAP)
INTRAMUSCULAR | Status: DC | PRN
Start: 2017-08-13 — End: 2017-08-13
  Administered 2017-08-13: 6 mg via INTRAVENOUS

## 2017-08-13 MED ORDER — MIDAZOLAM HCL 2 MG/2ML IJ SOLN
INTRAMUSCULAR | Status: AC
Start: 2017-08-13 — End: ?
  Filled 2017-08-13: qty 2

## 2017-08-13 MED ORDER — ONDANSETRON HCL 4 MG/2ML IJ SOLN
4.0000 mg | Freq: Once | INTRAMUSCULAR | Status: DC | PRN
Start: 2017-08-13 — End: 2017-08-13

## 2017-08-13 MED ORDER — FAMOTIDINE 20 MG/2ML IV SOLN
INTRAVENOUS | Status: AC
Start: 2017-08-13 — End: ?
  Filled 2017-08-13: qty 2

## 2017-08-13 MED ORDER — PHENYLEPHRINE HCL 10 MG/ML IV SOLN (WRAP)
Status: DC | PRN
Start: 2017-08-13 — End: 2017-08-13
  Administered 2017-08-13: 100 ug via INTRAVENOUS
  Administered 2017-08-13: 50 ug via INTRAVENOUS
  Administered 2017-08-13: 100 ug via INTRAVENOUS
  Administered 2017-08-13: 50 ug via INTRAVENOUS

## 2017-08-13 MED ORDER — CEFAZOLIN 1 GM MBP (CNR)
1.0000 g | Freq: Three times a day (TID) | Status: AC
Start: 2017-08-13 — End: 2017-08-13
  Administered 2017-08-13: 1 g via INTRAVENOUS

## 2017-08-13 MED ORDER — PROPOFOL 10 MG/ML IV EMUL (WRAP)
INTRAVENOUS | Status: AC
Start: 2017-08-13 — End: ?
  Filled 2017-08-13: qty 20

## 2017-08-13 MED ORDER — CEFAZOLIN 1 GM MBP (CNR)
Status: AC
Start: 2017-08-13 — End: 2017-08-13
  Filled 2017-08-13: qty 100

## 2017-08-13 MED ORDER — SCOPOLAMINE 1 MG/3DAYS TD PT72
MEDICATED_PATCH | TRANSDERMAL | Status: DC
Start: 2017-08-13 — End: 2017-08-13
  Administered 2017-08-13: 1 via TRANSDERMAL
  Filled 2017-08-13: qty 1

## 2017-08-13 MED ORDER — MEPERIDINE HCL 25 MG/ML IJ SOLN
12.5000 mg | Freq: Once | INTRAMUSCULAR | Status: DC | PRN
Start: 2017-08-13 — End: 2017-08-13

## 2017-08-13 MED ORDER — GLYCOPYRROLATE 0.2 MG/ML IJ SOLN
INTRAMUSCULAR | Status: AC
Start: 2017-08-13 — End: ?
  Filled 2017-08-13: qty 1

## 2017-08-13 MED ORDER — FENTANYL CITRATE (PF) 50 MCG/ML IJ SOLN (WRAP)
INTRAMUSCULAR | Status: DC | PRN
Start: 2017-08-13 — End: 2017-08-13
  Administered 2017-08-13 (×2): 25 ug via INTRAVENOUS
  Administered 2017-08-13: 50 ug via INTRAVENOUS

## 2017-08-13 SURGICAL SUPPLY — 23 items
ADHESIVE SKIN CLOSURE DERMABOND ADVANCED (Skin Closure) ×1
ADHESIVE SKIN CLOSURE DERMABOND ADVANCED .7 ML LIQUID APPLICATOR (Skin Closure) ×1 IMPLANT
ADHESIVE SKNCLS 2 OCTYL CYNCRLT .7ML (Skin Closure) ×1
DRAIN PENROSE 18X.5IN STRL (Drain) ×2 IMPLANT
ELECTRODE ADULT PATIENT RETURN L9 FT REM POLYHESIVE ACRYLIC FOAM (Procedure Accessories) ×1 IMPLANT
ELECTRODE PATIENT RETURN L9 FT VALLEYLAB (Procedure Accessories) ×1
ELECTRODE PT RTN RM PHSV ACRL FM C30- LB (Procedure Accessories) ×1
GLOVE SURG BIOGEL PF LTX SZ7.0 (Glove) ×2 IMPLANT
KIT MAJOR FFX (Kits) ×2 IMPLANT
PATCH SRG PP DDRGL SM OVL VENTRIO ST (Mesh) ×1 IMPLANT
PATCH SURGICAL L4.7 IN X W3.1 IN SMALL (Mesh) ×1 IMPLANT
PATCH SURGICAL L4.7 IN X W3.1 IN SMALL OVAL SPRING OPEN BIORESORBABLE (Mesh) ×1 IMPLANT
SLEEVE CMPR MED KN LGTH KDL SCD 21- IN (Sleeve) ×2
SLEEVE COMPRESSION MEDIUM KNEE LENGTH KENDALL SEQUENTIAL OD21- IN (Sleeve) ×1 IMPLANT
SOL BETADINE SOLUTION 4 OZ (Prep) ×2 IMPLANT
SUTURE ABS 3-0 SH VCL 27IN BRD COAT VIOL (Suture) ×1
SUTURE COATED VICRYL 3-0 SH L27 IN BRAID (Suture) ×1
SUTURE COATED VICRYL 3-0 SH L27 IN BRAID COATED VIOLET ABSORBABLE (Suture) ×1 IMPLANT
SUTURE VICRYL 0 CT2 27IN (Suture) ×2 IMPLANT
SUTURE VICRYL 2-0 SH 27IN (Suture) ×2 IMPLANT
SUTURE VICRYL 3-0 3X18IN (Suture) IMPLANT
SUTURE VICRYL 4-0 PS2 27IN (Suture) ×2 IMPLANT
TRAY SKIN DRY SCRUB (Tray) ×2 IMPLANT

## 2017-08-13 NOTE — Interval H&P Note (Signed)
The patient is seen and re-examined  No interval changes on H&P.

## 2017-08-13 NOTE — Anesthesia Postprocedure Evaluation (Signed)
Anesthesia Post Evaluation    Patient: Yvette Anthony    Procedure(s) with comments:  HERNIORRHAPHY, INGUINAL - RIGHT INGUINAL HERNIA OPEN    Anesthesia type: General LMA    Last Vitals:   Vitals:    08/13/17 1000   BP:    Pulse:    Resp: 16   Temp:    SpO2:        Patient Location: PACU      Post Pain: Patient not complaining of pain, continue current therapy    Mental Status: awake    Respiratory Function: tolerating room air    Cardiovascular: stable    Nausea/Vomiting: patient not complaining of nausea or vomiting    Hydration Status: adequate    Post Assessment: no apparent anesthetic complications, no reportable events and no evidence of recall          Anesthesia Qualified Clinical Data Registry 2018    PACU Reintubation  Did the Patient have general anesthesia with intubation: No        PONV Adult  Is the patient aged 59 or older: Yes  Did the patient receive recieve a general anesthestic: Yes  Does the patient have 3 or more risk factors for PONV? No  Did the patient receive anti-emetics from at least two classes of medications? Yes      PONV Pediatric  Is the patient aged 33-17? No            PACU Transfer Checklist Protocol  Was the patient transferred to the PACU at the conclusion of surgery? Yes  Was a checklist or transfer protocol used? Yes    ICU Transfer Checklist Protocol  Was the patient transferred to the ICU at the conclusion of surgery? No      Post-op Pain Assessment Prior to Anesthesia Care End  Age >=18 and assessed for pain in PACU: Yes  Pacu pain score <7/10: Yes      Perioperative Mortality  Perioperative mortality prior to Anesthesia end time: No    Perioperative Cardiac Arrest  Did the patient have an unanticipated intraoperative cardiac arrest between anesthesia start time and anesthesia end time? No    Unplanned Admission to ICU  Did the patient have an unplanned admission to the ICU (not initially anticipated at anesthesia start time)? No      Signed by: Dyanne Carrel,  08/13/2017 10:36 AM

## 2017-08-13 NOTE — Anesthesia Preprocedure Evaluation (Signed)
Anesthesia Evaluation    AIRWAY    Mallampati: II    TM distance: >3 FB  Neck ROM: full  Mouth Opening:full   CARDIOVASCULAR    cardiovascular exam normal       DENTAL         PULMONARY    pulmonary exam normal     OTHER FINDINGS              Relevant Problems   No relevant active problems               Anesthesia Plan    ASA 2     general                                 informed consent obtained                   Signed by: Dyanne Carrel 08/13/17 7:57 AM

## 2017-08-13 NOTE — Transfer of Care (Signed)
Anesthesia Transfer of Care Note    Patient: Yvette Anthony    Procedures performed: Procedure(s) with comments:  HERNIORRHAPHY, INGUINAL - RIGHT INGUINAL HERNIA OPEN    Anesthesia type: General LMA    Patient location:Phase I PACU    Last vitals:   Vitals:    08/13/17 0938   BP: 123/58   Pulse: 66   Resp:    Temp: 36.4 C (97.5 F)   SpO2: 100%       Post pain: Patient not complaining of pain, continue current therapy      Mental Status:sedated    Respiratory Function: tolerating face mask    Cardiovascular: stable    Nausea/Vomiting: patient not complaining of nausea or vomiting    Hydration Status: adequate    Post assessment: no apparent anesthetic complications, no reportable events and no evidence of recall    Signed by: Rubye Oaks  08/13/17 9:39 AM

## 2017-08-13 NOTE — Brief Op Note (Signed)
BRIEF OP NOTE    Date Time: 08/13/17 9:26 AM    Patient Name:   Yvette Anthony    Date of Operation:   08/13/2017    Providers Performing:   Surgeon(s):  Verneda Skill, MD    Assistant (s):  Andrey Cota, MD  Operative Procedure:   Procedure(s):  HERNIORRHAPHY, INGUINAL    Preoperative Diagnosis:   Pre-Op Diagnosis Codes:     * Hernia, inguinal, right [K40.90]    Postoperative Diagnosis:   * No post-op diagnosis entered *    Anesthesia:   General    Estimated Blood Loss:        Specimens:       Findings:   See my dictation    Complications:   none      Signed by: Verneda Skill, MD                                                                              [facility]IN OR

## 2017-08-13 NOTE — Discharge Instr - AVS First Page (Addendum)
Pain Medication:   May use over the counter medications, Motrin , Advil, Aleve around the clock after meals for next few days and use prescription pain medication as needed in addition.  Take prescription medication with some food to avoid upset stomach.   Take stool softner or laxative while taking narcotic pain medications which will cause constipation.       Wound Care:   Apply ice pack for your comfort.   May shower.   Expect some swelling and bruising around the surgical site  ( which may extend to the genital area if inguinal hernia.)    Activities:   As tolerated.   Avoid heavy lifting or straining till pain free.   Avoid driving while taking narcotic pain medications.     Diet:  As tolerated.    Follow up:   Call [phone] for appointment in 7 to 10 days post op.             Anesthesia Discharge Instructions: After Your Surgery  You've just had surgery. During surgery you were given medicine called anesthesia to keep you relaxed and comfortable. After surgery you may have some pain or nausea. This is common. Your doctor or nurse will show you how to take care of yourself when you go home. He or she will also answer your questions. Here are some tips for feeling better and getting well after surgery.        For the first 24 hours after your surgery:   Do not drive or use heavy equipment.    Do not make important decisions or sign legal papers.   Do not drink alcohol.   Have someone stay with you, if needed. He or she can watch for problems and help keep you safe.   Have an adult family member or friend drive you home.    Managing pain  If you have pain after surgery, pain medicine will help you feel better. Take it as ordered by your surgeon, before pain becomes severe. Also, ask your doctor about other ways to control pain. This might be with rest, ice, repositioning, and elevation. Follow any other instructions your surgeon or nurse gives you.    Tips for taking pain medicine:    Stay on  schedule with your medication   Most pain relievers taken by mouth need at least 20 to 30 minutes to start to work.   Taking medicine on a schedule can help you remember to take it. Try to time your medicine so that you can take it before starting an activity. This might be before you get dressed, go for a walk, or sit down for dinner.    Common side effects of prescription pain medications   Pain medicines can cause nausea. Eat a little food before taking pain medicine to avoid this.   Constipation is a common side effect of pain medicines. Drinking lots of fluids and eating foods, such as fruits and vegetables, that are high in fiber can also help.    Call your doctor before taking any medicines such as laxatives or stool softeners to help ease constipation. Also, ask if you should avoid any foods. Remember, do not take laxatives unless your surgeon has prescribed them   Drinking alcohol and taking pain medicines can cause dizziness and slow your breathing. It can even be deadly. Do not drink alcohol while taking pain medicines.   Pain medicines can make you react more slowly to things. Do not drive or   run machinery while taking pain medicines.    Important facts about Acetaminophen (Tylenol)   Acetaminophen or Tylenol is a common pain reliever.   Check your prescription pain medication labels to see if it contains acetaminophen.   Your health care provider may tell you to take acetaminophen to help ease your pain. Ask him or her how much you should to take each day.    Some prescription pain medicines have acetaminophen and other ingredients. Using both prescription pain medicines and over the counter (OTC) acetaminophen for pain can cause you to overdose.   Max dose of acetaminophen is 4000mg/24 hours for most people. Overdosing on acetaminophen may lead to liver failure.   Read the labels on your (over the counter) medicines with care. This will help you to clearly know the list of ingredients, how  much to take, and any warnings. This will prevent you from taking too much acetaminophen.   If you have questions or do not understand the information, ask your pharmacist or health care provider to explain it to you before you take the OTC medicine.    Managing nausea  Some people have an upset stomach after surgery. This is often because of anesthesia, pain, pain medicines, or the stress of surgery. If you were on a special food plan before surgery, ask your doctor if you should follow it while you get better. The following are tips to help you manage nausea after anesthesia:   Do not push yourself to eat. Your body will tell you when to eat and how much.   Start off with clear liquids and soup. They are easier to digest.   Next try semi-solid foods such as mashed potatoes, applesauce, and gelatin, as you feel ready.   Slowly move to solid foods. Don't eat fatty, rich, or spicy foods at first.   Do not force yourself to have 3 large meals a day. Instead eat smaller amounts more often.   Take pain medicines with a small amount of food, such as crackers or toast, to avoid nausea.     Call your surgeon if:   You have worsening pain an hour after taking pain medicine. The pain medicine may not be strong enough.   You feel too sleepy, dizzy, or groggy. The pain medicine may be too strong.   You have side effects like persistent nausea, vomiting, or skin changes such as rash, itching, or hives.    You have bleeding through your dressing or your dressing becomes saturated.   You have signs of infection such as redness, fever, or increased/foul smelling drainage.      If you have obstructive sleep apnea (OSA)  You were given anesthesia medicine during surgery to keep you comfortable and free of pain. After surgery, you may have more apnea spells because of this medicine and other medicines you were given. The spells may last longer than usual.   At home:   Keep using the continuous positive airway pressure  (CPAP) device when you sleep. Unless your health care provider tells you not to, use it when you sleep,        or take a nap; day or night. CPAP is a common device used to treat OSA.  You are to sleep upright- with HOB > 30 degrees, with pillows; or use a recliner          for the first week or while on opioids or medications that are sedating.  Another adult needs to be with   you during first 24 hours home after surgery.  Limit opioid use when possible and use alternative comfort measures.  Talk with your provider before taking any pain medicine, muscle relaxants, or sedatives. Your provider will tell you about the possible dangers of taking these medicines.

## 2017-08-14 NOTE — Op Note (Signed)
Procedure Date: 08/13/2017     Patient Type: A     SURGEON: Verneda Skill MD  ASSISTANT:       PREOPERATIVE DIAGNOSIS:  Right inguinal hernia.     POSTOPERATIVE DIAGNOSIS:  Right inguinal hernia, both direct and indirect type.     TITLE OF PROCEDURE:  Right inguinal hernia repair with 8 x 12 cm Ventrio ST mesh.     ANESTHESIA:  General with LMA.     INDICATIONS FOR PROCEDURE:  This is a 66 year old female with a symptomatic right groin bulge.   Elective repair of the right inguinal hernia was recommended and discussed,  and informed consent was obtained prior to the procedure.     DESCRIPTION OF PROCEDURE:  The patient was placed on the operating room table in supine position after  all required preoperative procedures were completed.  After the induction  of adequate general anesthesia with LMA, the right groin and lower abdomen  was prepped and draped in usual sterile fashion.  After use of local  anesthetic, the incision was made and external oblique aponeurosis was  dissected and incised above the level of the internal ring.  Internal  oblique muscles were then split while the sensory nerves were identified  and protected.  The transversalis was exposed and vertically incised.   Blunt dissection was carried out toward the pelvis to create a  preperitoneal space to the level below the Cooper's ligament.  This  identified an approximately 1 cm direct space defects which contained  preperitoneal fat and this was manually reduced.  The internal ring also  contained a preperitoneal fat and a small hernia sac, which was manually  reduced and separated from the round ligament, and was suture ligated.   Also, the round ligament was divided between the clamps and tied.  An 8 x  12 cm Ventrio ST mesh was inserted into the created preperitoneal space  covering both indirect and direct indirect defect with enough overlaps.   Adequate hemostasis was achieved and the incision was closed with  absorbable suture in multiple  anatomical layers.  The patient tolerated the  procedure well and was transferred to recovery room in satisfactory  condition.  Estimated blood loss was minimum and the counts were correct at  the end of the procedure.  There were no intraoperative complications.           D:  08/13/2017 17:12 PM by Dr. Verneda Skill, MD [phone])  T:  08/14/2017 02:22 AM by NTS      Everlean Cherry: [id number]) (Doc ID: [id number])

## 2017-08-16 ENCOUNTER — Encounter: Payer: Self-pay | Admitting: Surgery

## 2017-09-07 ENCOUNTER — Ambulatory Visit (INDEPENDENT_AMBULATORY_CARE_PROVIDER_SITE_OTHER): Payer: Medicare Other | Admitting: Neurology

## 2017-09-07 ENCOUNTER — Encounter (INDEPENDENT_AMBULATORY_CARE_PROVIDER_SITE_OTHER): Payer: Self-pay | Admitting: Neurology

## 2017-09-07 VITALS — BP 131/84 | HR 71 | Ht 69.0 in | Wt 171.0 lb

## 2017-09-07 DIAGNOSIS — R200 Anesthesia of skin: Secondary | ICD-10-CM

## 2017-09-07 DIAGNOSIS — G20 Parkinson's disease: Secondary | ICD-10-CM

## 2017-09-07 MED ORDER — RASAGILINE MESYLATE 0.5 MG PO TABS
0.5000 mg | ORAL_TABLET | Freq: Every day | ORAL | 3 refills | Status: DC
Start: 2017-09-07 — End: 2017-10-08

## 2017-09-07 NOTE — Patient Instructions (Signed)
Start to take 0.5mg  daily of Azilect.  Please take in the morning.  - We discussed the potential side-effects to look for, but I do not anticipate them happening.  If you do experience side-effects, please contact me.  - While on Azilect, avoid the following medications: cipro, dextromethorphan (in cough medicine), meperidine (Demerol), Luvox, Prozac, flexeril, and tramadol.

## 2017-09-07 NOTE — Progress Notes (Signed)
[email or web address]    Subjective:      CC: Tremor    66 y.o.  F presenting to neurology clinic for f/u of tremors.  Her patch.  She has not noticed any changes, so the shaking in her hand and leg.  No side effects noted.  No nausea, drowsiness, dizziness, sleep attacks, impulsivity, compulsivity, or leg edema.     Walking well, no falls.  Shakiness is annoying, but she can still use her hands well.  Constipation is a bit better.  Still occasional vivid dreams, but no RBD.     No other new symptoms or complaints.    Patient denies smoking, drinks occasionally.  She has not noticed that alcohol makes any change to her tremors.  No drug use.  Her father had Parkinson's disease and is deceased. No other family history of tremors.        Retained from initial Consultation:   Patient states that she has had tremors in her hands for years, but over the last year or so they have gotten much worse.  She feels like the tremors occur all the time in her hands and legs, and when she is lying down at night.  She feels like her whole body is shaking.  She was previously diagnosed with essential tremor.  She was tried on propranolol, but it did not seem to help at all.  She was tried on primidone, but even a low dose made her feel sick.  She is taking topiramate for weight loss, but it does not seem to help her tremor.  She has some stiffness in her right shoulder, and she thinks this is from her psoriatic arthritis.  No other notable stiffness in arms and legs.  No change in gait, no significant instability.  No falls.  She does have long-standing constipation, and works with a gastroenterologist.  Occasional neck pain and back pain, but no shooting pain down arms or legs.  She does have occasional numbness in her left leg, simply in the left foot.  Denies any weakness in her arms and legs.    Denies anosmia.  No RBD.  She does often have trouble sleeping and wakes up overnight many nights to go to the  restroom.  Denies any memory loss.  Denies hallucinations.         The following portions of the patient's history were reviewed and updated as appropriate: allergies, current medications, past family history, past medical history, past social history, past surgical history and problem list.    Review of Systems  Per HPI.  No recent illness.  Denies fever, chills, cough, sinus pain, eye pain, eye redness, ear pain, rhinorrhea, sore throat, chest pain, SOB, wheezing, abd pain, Nausea, Vomiting, diarrhea,  dysuria, or rashes.  All other ROS negative.       Objective:     BP 131/84 (BP Site: Left arm, Patient Position: Sitting, Cuff Size: Medium)   Pulse 71   Ht 1.753 m (5\' 9" )   Wt 77.6 kg (171 lb)   BMI 25.25 kg/m     Constitutional: NAD   Eyes: Clear conjunctiva  Neck: Nontender, full ROM, No Lhermitte's sign  Cardiovascular: RRR  Resipratory: CTA B   Musculoskeletal: Normal muscle bulk.  No muscle pain  Integumentary: No abnormal rash noted  Psych: Normal affect    Neurological:   Mental Status: Alert & oriented to person, place, month, & year.   Language: Spontaneous & fluent speech, good comprehension.  Cranial Nerves:  II, III: PERRL, VFF  III, IV, VI: EOMI, No nystagmus, no palsies, no ptosis  V: Intact to LT V1-V3 distribution bilaterally.   VI: Symmetrical face and expression.   VIII: Hearing intact to finger rub bilaterally.   IX, X: Palate/Uvula elevates symmetrically.   XI: 5/5 Trapezius & SCM bilaterally.   XII: Tongue is midline.     Motor Exam: Normal bulk and tone. No clonus. No pronator drift. Strength 5/5 throughout and symmetric.No resting tremor, postural tremor bilaterally, slightly worse on right.  Normal fine motor movements, no definite increase in tone and no definite cogwheeling.    Sensation: Sensation to LT intact grossly throughout symmetrically. Romberg negative    Cerebellar:   Finger to Nose: Mild postural intention tremor bilaterally.  No dysmetria.    Reflexes: 2+ throughout,  symmetric, with down-going toes.    Station/ Gait: Normal with an stride length.  Decreased arm swing with some re-emergent tremor on right.      Assessment:     Patient with long-standing tremors and possible restless leg syndrome.  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  She was started on Neupro, and is tolerating it well.  She still having the uncomfortable shaky feeling in her legs, So we will try to add Azilect and gradually increased.      EMG/NCS was normal w/ no evidence of neuropathy or radiculopathy.  Plan:     Continue Neupro 4 mg daily.  Start Azilect 0.5 mg daily.  Discussed possible risks, side effects and interactions.  Provided recipe for constipation cocktail.     RTC in 2 months. Patient can follow up sooner if needed. In the meantime, patient will contact the office with any questions or concerns.     -----------------------------------------------------------    Additional notes and data scanned including patient questionnaire which may contain pertinent information to visit.         Kateri Plummer, MD, PhD  Co-Director  Movement Disorders Specialist  [facility]  [facility]    [address]  [address]  [place] [address]    [place], [place] [address]  T [phone]  F [phone]   T [phone]  F [phone]     [email or web address]

## 2017-09-20 ENCOUNTER — Ambulatory Visit
Admission: RE | Admit: 2017-09-20 | Discharge: 2017-09-20 | Disposition: A | Discharge status: Home / Self Care | Payer: Medicare Other | Source: Ambulatory Visit | Attending: Rheumatology | Admitting: Rheumatology

## 2017-09-20 ENCOUNTER — Other Ambulatory Visit: Payer: Self-pay | Admitting: Rheumatology

## 2017-09-20 DIAGNOSIS — L4050 Arthropathic psoriasis, unspecified: Secondary | ICD-10-CM | POA: Insufficient documentation

## 2017-09-20 DIAGNOSIS — M2550 Pain in unspecified joint: Secondary | ICD-10-CM

## 2017-10-08 ENCOUNTER — Encounter (INDEPENDENT_AMBULATORY_CARE_PROVIDER_SITE_OTHER): Payer: Self-pay | Admitting: Neurology

## 2017-10-08 ENCOUNTER — Ambulatory Visit (INDEPENDENT_AMBULATORY_CARE_PROVIDER_SITE_OTHER): Payer: Medicare Other | Admitting: Neurology

## 2017-10-08 VITALS — BP 138/82 | HR 73 | Ht 69.5 in | Wt 177.0 lb

## 2017-10-08 DIAGNOSIS — G20 Parkinson's disease: Secondary | ICD-10-CM

## 2017-10-08 MED ORDER — ROTIGOTINE 4 MG/24HR TD PT24
1.0000 | MEDICATED_PATCH | Freq: Every day | TRANSDERMAL | 3 refills | Status: DC
Start: 2017-10-08 — End: 2017-12-06

## 2017-10-08 MED ORDER — RASAGILINE MESYLATE 1 MG PO TABS
1.0000 mg | ORAL_TABLET | Freq: Every day | ORAL | 3 refills | Status: DC
Start: 2017-10-08 — End: 2018-03-16

## 2017-10-08 NOTE — Progress Notes (Signed)
[facility]    Subjective:      CC: Tremor    66 y.o.  F presenting to neurology clinic for f/u of tremors.She is now using the Neupro 4 mg patch, in addition to Azilect 0.5 mg daily.  She thinks her tremor is a bit better, but there certainly times when it still comes out.  Especially if she is tired anxious or nervous.  She denies any side effects to Azilect or Neupro.   No nausea, drowsiness, dizziness, sleep attacks, impulsivity, compulsivity, or leg edema.     Walking well, no falls.  Tremors are annoying, but she can still use her hands well.  Constipation is a bit better.  Still occasional vivid dreams, but no RBD.     No other new symptoms or complaints.    Patient denies smoking, drinks occasionally.  She has not noticed that alcohol makes any change to her tremors.  No drug use.  Her father had Parkinson's disease and is deceased. No other family history of tremors.        Retained from initial Consultation:   Patient states that she has had tremors in her hands for years, but over the last year or so they have gotten much worse.  She feels like the tremors occur all the time in her hands and legs, and when she is lying down at night.  She feels like her whole body is shaking.  She was previously diagnosed with essential tremor.  She was tried on propranolol, but it did not seem to help at all.  She was tried on primidone, but even a low dose made her feel sick.  She is taking topiramate for weight loss, but it does not seem to help her tremor.  She has some stiffness in her right shoulder, and she thinks this is from her psoriatic arthritis.  No other notable stiffness in arms and legs.  No change in gait, no significant instability.  No falls.  She does have long-standing constipation, and works with a gastroenterologist.  Occasional neck pain and back pain, but no shooting pain down arms or legs.  She does have occasional numbness in her left leg, simply in the left foot.   Denies any weakness in her arms and legs.    Denies anosmia.  No RBD.  She does often have trouble sleeping and wakes up overnight many nights to go to the restroom.  Denies any memory loss.  Denies hallucinations.         The following portions of the patient's history were reviewed and updated as appropriate: allergies, current medications, past family history, past medical history, past social history, past surgical history and problem list.    Review of Systems  Per HPI.  No recent illness.  Denies fever, chills, cough, sinus pain, eye pain, eye redness, ear pain, rhinorrhea, sore throat, chest pain, SOB, wheezing, abd pain, Nausea, Vomiting, diarrhea,  dysuria, or rashes.  All other ROS negative.       Objective:     Vitals:    10/08/17 1419   BP: 138/82   Pulse: 73         Constitutional: NAD   Eyes: Clear conjunctiva  Neck: Nontender, full ROM, No Lhermitte's sign  Cardiovascular: RRR  Resipratory: CTA B   Musculoskeletal: Normal muscle bulk.  No muscle pain  Integumentary: No abnormal rash noted  Psych: Normal affect    Neurological:   Mental Status: Alert & oriented to person, place, month, &  year.   Language: Spontaneous & fluent speech, good comprehension.    Cranial Nerves:  II, III: PERRL, VFF  III, IV, VI: EOMI, No nystagmus, no palsies, no ptosis  V: Intact to LT V1-V3 distribution bilaterally.   VI: Symmetrical face and expression.   VIII: Hearing intact to finger rub bilaterally.   IX, X: Palate/Uvula elevates symmetrically.   XI: 5/5 Trapezius & SCM bilaterally.   XII: Tongue is midline.     Motor Exam: Normal bulk and tone. No clonus. No pronator drift. Strength 5/5 throughout and symmetric.No resting tremor, postural tremor bilaterally, slightly worse on right.  Normal fine motor movements, no definite increase in tone and no definite cogwheeling.    Sensation: Sensation to LT intact grossly throughout symmetrically. Romberg negative    Cerebellar:   Finger to Nose: Mild postural intention tremor  bilaterally.  No dysmetria.    Reflexes: 2+ throughout, symmetric, with down-going toes.    Station/ Gait: Normal with an stride length.  Decreased arm swing with some re-emergent tremor on right.      Assessment:     Patient with long-standing tremors and possible restless leg syndrome.  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  She was started on Neupro, and is tolerating it well.  Tremors and RLS are somewhat better, and with no side effects to current regimen, so will increase Azilect to 1 mg.    EMG/NCS was normal w/ no evidence of neuropathy or radiculopathy.  Plan:     Continue Neupro 4 mg daily.  incr Azilect  To 1 mg daily.  Discussed possible risks, side effects and interactions.  Provided recipe for constipation cocktail.     RTC in 2 months. Patient can follow up sooner if needed. In the meantime, patient will contact the office with any questions or concerns.     -----------------------------------------------------------    Additional notes and data scanned including patient questionnaire which may contain pertinent information to visit.         Kateri Plummer, MD, PhD  Co-Director  Movement Disorders Specialist  [facility]  [facility]    [address]  [address]  [place] ,[place] [address]    [place], [place] [address]  T [phone]  F [phone]   T [phone]  F [phone]     [email or web address]

## 2017-10-11 ENCOUNTER — Encounter (INDEPENDENT_AMBULATORY_CARE_PROVIDER_SITE_OTHER): Payer: Self-pay | Admitting: Neurology

## 2017-12-06 ENCOUNTER — Encounter (INDEPENDENT_AMBULATORY_CARE_PROVIDER_SITE_OTHER): Payer: Self-pay | Admitting: Neurology

## 2017-12-06 ENCOUNTER — Ambulatory Visit (INDEPENDENT_AMBULATORY_CARE_PROVIDER_SITE_OTHER): Payer: Medicare Other | Admitting: Neurology

## 2017-12-06 VITALS — BP 125/74 | HR 72 | Wt 177.0 lb

## 2017-12-06 DIAGNOSIS — R251 Tremor, unspecified: Secondary | ICD-10-CM

## 2017-12-06 DIAGNOSIS — G20 Parkinson's disease: Secondary | ICD-10-CM

## 2017-12-06 MED ORDER — ROTIGOTINE 6 MG/24HR TD PT24
1.0000 | MEDICATED_PATCH | Freq: Every day | TRANSDERMAL | 3 refills | Status: DC
Start: 2017-12-06 — End: 2018-03-16

## 2017-12-06 NOTE — Progress Notes (Signed)
[facility]    Subjective:      CC: Tremor    67 y.o.  F presenting to neurology clinic for f/u of tremors , and probable Parkinson's disease.  She is still using the Neupro 4 mg patch, in addition to Azilect 01 mg daily.  She feels the tremors benefit worse recently.  She does note that it feels a bit worse when she is stressed anxious or tired.  However, even outside of those time.  She is noticing more tremor in her hands and even her legs. She denies any side effects to Azilect or Neupro.   No nausea, drowsiness, dizziness, sleep attacks, impulsivity, compulsivity, or leg edema.     Walking well, no falls.  Tremors are annoying, but she can still use her hands well.  Constipation is a bit better.  Still occasional vivid dreams, but no RBD.     No other new symptoms or complaints.    Patient denies smoking, drinks occasionally.  She has not noticed that alcohol makes any change to her tremors.  No drug use.  Her father had Parkinson's disease and is deceased. No other family history of tremors.        Retained from initial Consultation:   Patient states that she has had tremors in her hands for years, but over the last year or so they have gotten much worse.  She feels like the tremors occur all the time in her hands and legs, and when she is lying down at night.  She feels like her whole body is shaking.  She was previously diagnosed with essential tremor.  She was tried on propranolol, but it did not seem to help at all.  She was tried on primidone, but even a low dose made her feel sick.  She is taking topiramate for weight loss, but it does not seem to help her tremor.  She has some stiffness in her right shoulder, and she thinks this is from her psoriatic arthritis.  No other notable stiffness in arms and legs.  No change in gait, no significant instability.  No falls.  She does have long-standing constipation, and works with a gastroenterologist.  Occasional neck pain and back  pain, but no shooting pain down arms or legs.  She does have occasional numbness in her left leg, simply in the left foot.  Denies any weakness in her arms and legs.    Denies anosmia.  No RBD.  She does often have trouble sleeping and wakes up overnight many nights to go to the restroom.  Denies any memory loss.  Denies hallucinations.         The following portions of the patient's history were reviewed and updated as appropriate: allergies, current medications, past family history, past medical history, past social history, past surgical history and problem list.    Review of Systems  Per HPI.  No recent illness.  Denies fever, chills, cough, sinus pain, eye pain, eye redness, ear pain, rhinorrhea, sore throat, chest pain, SOB, wheezing, abd pain, Nausea, Vomiting, diarrhea,  dysuria, or rashes.  All other ROS negative.       Objective:     Vitals:    12/06/17 1327   BP: 125/74   Pulse: 72         Constitutional: NAD   Eyes: Clear conjunctiva  Neck: Nontender, full ROM, No Lhermitte's sign  Cardiovascular: RRR  Resipratory: CTA B   Musculoskeletal: Normal muscle bulk.  No muscle pain  Integumentary:  No abnormal rash noted  Psych: Normal affect    Neurological:   Mental Status: Alert & oriented to person, place, month, & year.   Language: Spontaneous & fluent speech, good comprehension.    Cranial Nerves:  II, III: PERRL, VFF  III, IV, VI: EOMI, No nystagmus, no palsies, no ptosis  V: Intact to LT V1-V3 distribution bilaterally.   VI: Symmetrical face and expression.   VIII: Hearing intact to finger rub bilaterally.   IX, X: Palate/Uvula elevates symmetrically.   XI: 5/5 Trapezius & SCM bilaterally.   XII: Tongue is midline.     Motor Exam: Normal bulk and tone. No clonus. No pronator drift. Strength 5/5 throughout and symmetric.No resting tremor, postural tremor bilaterally, slightly worse on right.  Normal fine motor movements, no definite increase in tone and no definite cogwheeling.    Sensation: Sensation to  LT intact grossly throughout symmetrically. Romberg negative    Cerebellar:   Finger to Nose: Mild postural intention tremor bilaterally.  No dysmetria.    Reflexes: 2+ throughout, symmetric, with down-going toes.    Station/ Gait: Normal with an stride length.  Decreased arm swing with some re-emergent tremor on right.    UPDRS-8    Assessment:     Patient with long-standing tremors and possible restless leg syndrome.  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  She was started on Neupro, and is tolerating it well.   Also now on Azilect to 1 mg.    Tremors and RLS are somewhat better, and with no side effects to current regimen,s so we will increased Neupro to 6 mg.    EMG/NCS was normal w/ no evidence of neuropathy or radiculopathy.  Plan:     Incr Neupro to 6 mg daily.  Cont Azilect  To 1 mg daily.  Discussed possible risks, side effects and interactions.  Provided recipe for constipation cocktail.     RTC in 2 months. Patient can follow up sooner if needed. In the meantime, patient will contact the office with any questions or concerns.     -----------------------------------------------------------    Additional notes and data scanned including patient questionnaire which may contain pertinent information to visit.         Kateri Plummer, MD, PhD  Co-Director  Movement Disorders Specialist  [facility]  [facility]    [address]  [address]  [place] [address]    [place], [place] [address]  T [phone]  F [phone]   T [phone]  F [phone]     [email or web address]

## 2017-12-07 ENCOUNTER — Encounter (INDEPENDENT_AMBULATORY_CARE_PROVIDER_SITE_OTHER): Payer: Self-pay | Admitting: Neurology

## 2018-01-14 ENCOUNTER — Ambulatory Visit (INDEPENDENT_AMBULATORY_CARE_PROVIDER_SITE_OTHER): Payer: Medicare Other | Admitting: Neurology

## 2018-01-14 ENCOUNTER — Encounter (INDEPENDENT_AMBULATORY_CARE_PROVIDER_SITE_OTHER): Payer: Self-pay | Admitting: Neurology

## 2018-01-14 VITALS — BP 126/78 | HR 76 | Wt 177.0 lb

## 2018-01-14 DIAGNOSIS — G20 Parkinson's disease: Secondary | ICD-10-CM

## 2018-01-14 DIAGNOSIS — G4700 Insomnia, unspecified: Secondary | ICD-10-CM

## 2018-01-14 DIAGNOSIS — G4752 REM sleep behavior disorder: Secondary | ICD-10-CM

## 2018-01-14 DIAGNOSIS — R251 Tremor, unspecified: Secondary | ICD-10-CM

## 2018-01-14 MED ORDER — CLONAZEPAM 0.5 MG PO TABS
0.25 mg | ORAL_TABLET | Freq: Every evening | ORAL | 5 refills | Status: DC
Start: 2018-01-14 — End: 2019-01-06

## 2018-01-14 NOTE — Progress Notes (Signed)
[facility]    Subjective:      CC: Tremor    67 y.o.  F presenting to neurology clinic for f/u of tremors , and probable Parkinson's disease.  She is using the Neupro 6 mg patch, in addition to Azilect 1 mg daily.  Again, she feels the tremors are still troublesome, especially over the past week.    She is noticing them in her R arm and now more in her R leg. She denies any side effects to Azilect or Neupro.   No nausea, drowsiness, dizziness, sleep attacks, impulsivity, compulsivity, or leg edema.     Walking well, no falls.  Tremors are annoying, but she can still use her hands well.  Constipation is a bit better.  She has not been sleeping as well recently and has been having more vivid dreams. Occassional RBD, but never fallen out of bed.     No other new symptoms or complaints.    Patient denies smoking, drinks occasionally.  She has not noticed that alcohol makes any change to her tremors.  No drug use.  Her father had Parkinson's disease and is deceased. No other family history of tremors.        Retained from initial Consultation:   Patient states that she has had tremors in her hands for years, but over the last year or so they have gotten much worse.  She feels like the tremors occur all the time in her hands and legs, and when she is lying down at night.  She feels like her whole body is shaking.  She was previously diagnosed with essential tremor.  She was tried on propranolol, but it did not seem to help at all.  She was tried on primidone, but even a low dose made her feel sick.  She is taking topiramate for weight loss, but it does not seem to help her tremor.  She has some stiffness in her right shoulder, and she thinks this is from her psoriatic arthritis.  No other notable stiffness in arms and legs.  No change in gait, no significant instability.  No falls.  She does have long-standing constipation, and works with a gastroenterologist.  Occasional neck pain and back  pain, but no shooting pain down arms or legs.  She does have occasional numbness in her left leg, simply in the left foot.  Denies any weakness in her arms and legs.    Denies anosmia.  No RBD.  She does often have trouble sleeping and wakes up overnight many nights to go to the restroom.  Denies any memory loss.  Denies hallucinations.         The following portions of the patient's history were reviewed and updated as appropriate: allergies, current medications, past family history, past medical history, past social history, past surgical history and problem list.    Review of Systems  Per HPI.  No recent illness.  Denies fever, chills, cough, sinus pain, eye pain, eye redness, ear pain, rhinorrhea, sore throat, chest pain, SOB, wheezing, abd pain, Nausea, Vomiting, diarrhea,  dysuria, or rashes.  All other ROS negative.       Objective:     Vitals:    01/14/18 1438   BP: 126/78   Pulse: 76         Constitutional: NAD   Eyes: Clear conjunctiva  Neck: Nontender, full ROM, No Lhermitte's sign  Cardiovascular: RRR  Resipratory: CTA B   Musculoskeletal: Normal muscle bulk.  No muscle pain  Integumentary: No abnormal rash noted  Psych: Normal affect    Neurological:   Mental Status: Alert & oriented to person, place, month, & year.   Language: Spontaneous & fluent speech, good comprehension.    Cranial Nerves:  II, III: PERRL, VFF  III, IV, VI: EOMI, No nystagmus, no palsies, no ptosis  V: Intact to LT V1-V3 distribution bilaterally.   VI: Symmetrical face and expression.   VIII: Hearing intact to finger rub bilaterally.   IX, X: Palate/Uvula elevates symmetrically.   XI: 5/5 Trapezius & SCM bilaterally.   XII: Tongue is midline.     Motor Exam: Normal bulk and tone. No clonus. No pronator drift. Strength 5/5 throughout and symmetric.No resting tremor, postural tremor bilaterally, slightly worse on right.  Normal fine motor movements, no definite increase in tone and no definite cogwheeling.    Sensation: Sensation to  LT intact grossly throughout symmetrically. Romberg negative    Cerebellar:   Finger to Nose: Mild postural intention tremor bilaterally.  No dysmetria.    Reflexes: 2+ throughout, symmetric, with down-going toes.    Station/ Gait: Normal with an stride length.  Decreased arm swing with some re-emergent tremor on right.    UPDRS-8    Assessment:     Patient with long-standing tremors and possible restless leg syndrome.  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  She was started on Neupro, and is tolerating it well.   Also now on Azilect to 1 mg.    Tremors and RLS are somewhat better, and with no side effects to current regimen and now on Neupro to 6 mg.  Her tremors are worsened if she is stressed, and are reprotedly worse over the past week.     EMG/NCS was normal w/ no evidence of neuropathy or radiculopathy.  Plan:     Cont Neupro to 6 mg daily.  Cont Azilect  To 1 mg daily.  Discussed possible risks, side effects and interactions.  Provided recipe for constipation cocktail.   Start low dose clonazepam for sleep trouble ans RBD      RTC in 2-3 months. Patient can follow up sooner if needed. In the meantime, patient will contact the office with any questions or concerns.     -----------------------------------------------------------    Additional notes and data scanned including patient questionnaire which may contain pertinent information to visit.         Kateri Plummer, MD, PhD  Co-Director  Movement Disorders Specialist  [facility]  [facility]    [address]  [address]  [place] [address]    [place], [place] [address]  T [phone]  F [phone]   T [phone]  F [phone]     [email or web address]

## 2018-01-16 ENCOUNTER — Encounter (INDEPENDENT_AMBULATORY_CARE_PROVIDER_SITE_OTHER): Payer: Self-pay | Admitting: Neurology

## 2018-01-24 ENCOUNTER — Emergency Department
Admission: EM | Admit: 2018-01-24 | Discharge: 2018-01-24 | Disposition: A | Discharge status: Home / Self Care | Payer: Medicare Other | Attending: Emergency Medicine | Admitting: Emergency Medicine

## 2018-01-24 ENCOUNTER — Emergency Department: Payer: Medicare Other

## 2018-01-24 DIAGNOSIS — M069 Rheumatoid arthritis, unspecified: Secondary | ICD-10-CM | POA: Insufficient documentation

## 2018-01-24 DIAGNOSIS — Z86718 Personal history of other venous thrombosis and embolism: Secondary | ICD-10-CM | POA: Insufficient documentation

## 2018-01-24 DIAGNOSIS — Z79899 Other long term (current) drug therapy: Secondary | ICD-10-CM | POA: Insufficient documentation

## 2018-01-24 DIAGNOSIS — Z9012 Acquired absence of left breast and nipple: Secondary | ICD-10-CM | POA: Insufficient documentation

## 2018-01-24 DIAGNOSIS — Z853 Personal history of malignant neoplasm of breast: Secondary | ICD-10-CM | POA: Insufficient documentation

## 2018-01-24 DIAGNOSIS — M25562 Pain in left knee: Secondary | ICD-10-CM

## 2018-01-24 DIAGNOSIS — M25462 Effusion, left knee: Secondary | ICD-10-CM | POA: Insufficient documentation

## 2018-01-24 DIAGNOSIS — Z86711 Personal history of pulmonary embolism: Secondary | ICD-10-CM | POA: Insufficient documentation

## 2018-01-24 MED ORDER — NALOXONE HCL 4 MG/0.1ML NA LIQD
NASAL | 0 refills | Status: DC
Start: 2018-01-24 — End: 2018-09-12

## 2018-01-24 MED ORDER — ACETAMINOPHEN-CODEINE #3 300-30 MG PO TABS
1.00 | ORAL_TABLET | Freq: Four times a day (QID) | ORAL | 0 refills | Status: DC | PRN
Start: 2018-01-24 — End: 2019-01-06

## 2018-01-24 MED ORDER — ACETAMINOPHEN 500 MG PO TABS
1000.00 mg | ORAL_TABLET | Freq: Once | ORAL | Status: AC
Start: 2018-01-24 — End: 2018-01-24
  Administered 2018-01-24: 16:00:00 1000 mg via ORAL
  Filled 2018-01-24: qty 2

## 2018-01-24 NOTE — ED Provider Notes (Signed)
EMERGENCY DEPARTMENT HISTORY AND PHYSICAL EXAM     Physician/Midlevel provider first contact with patient: 01/24/18 1543         Date: 01/24/2018  Patient Name: Yvette Anthony  Attending Physician: Suzy Bouchard, MD  Advanced Practice Provider: Sena Hitch    History of Presenting Illness       History Provided By: Patient    Additional History: Yvette Anthony is a 67 y.o. female presenting to the ED with c/o L knee pain. States she has RA and she gets knee pain  swelling and she gets it drained, but she has been having knee pain since yesterday, 10/10, dull, constant which is different from her usual pain and she worries she might have DVT. States she had DVT and PE in the past and she want to make sure. Denies any recent injury, fever, cp, SOB n/v, redness to knee.     PCP: Yvette Anda, MD  SPECIALISTS:     Yvette Anthony, Yvette Anthony [facility]   Home Medication Instructions [phone]    Printed on:01/24/18 1855   Medication Information 08 AM 10 AM 12 Noon 06 PM 08 PM 10 PM Bed time             acetaminophen-codeine (TYLENOL #3) 300-30 MG per tablet  Take 1 tablet by mouth every 6 (six) hours as needed for Pain (or cough).for up to 12 doses            calcium citrate-vitamin D (CITRACAL+D) 315-200 MG-UNIT per tablet  Take 1 tablet by mouth 2 (two) times daily.            clonazePAM (KLONOPIN) 0.5 MG tablet  Take 0.5 tablets (0.25 mg total) by mouth nightly.            folic acid (FOLVITE) 1 MG tablet  Take 1 mg by mouth daily.            methotrexate 2.5 MG tablet  Take 17.5 mg by mouth Q Sunday.                naloxone (NARCAN) 4 MG/0.1ML nasal spray  1 spray intranasally. If pt does not respond or relapses into respiratory depression call 911. Give additional doses every 2-3 min.            Omega-3 Fatty Acids (OMEGA-3 FISH OIL) 500 MG Cap  Take by mouth daily.                omeprazole (PRILOSEC) 40 MG capsule  Take 40 mg by mouth 2 (two) times daily.            Probiotic Product (PROBIOTIC PO)  Take by  mouth daily.            Rasagiline Mesylate (AZILECT) 1 MG Tab  Take 1 tablet (1 mg total) by mouth daily.            rotigotine (NEUPRO) 6 MG/24HR patch  Place 1 patch onto the skin daily.            topiramate (TOPAMAX) 50 MG tablet  Take 50 mg by mouth 2 (two) times daily.                Past History     Past Medical History:  Past Medical History:   Diagnosis Date   . Breast cancer 2000    Left-no chemo//no  radiation    . Breast cancer 2005    right- +chemo LD 2005// + radiation  LD 2006   . Inguinal hernia, right    . Post-operative nausea and vomiting    . Psoriatic arthritis 2016   . Pulmonary embolism 2000    post-op mastectomy       Past Surgical History:  Past Surgical History:   Procedure Laterality Date   . BREAST LUMPECTOMY Right 2005   . BREAST RECONSTRUCTION Left 2001   . HERNIORRHAPHY, INGUINAL Right 08/13/2017    Procedure: HERNIORRHAPHY, INGUINAL;  Surgeon: Yvette Skill, MD;  Location: [facility];  Service: General;  Laterality: Right;  RIGHT INGUINAL HERNIA OPEN   . MASTECTOMY Left 2000   . MYOMECTOMY  1985       Family History:  History reviewed. No pertinent family history.    Social History:  Social History     Social History   . Marital status: Single     Spouse name: N/A   . Number of children: N/A   . Years of education: N/A     Social History Main Topics   . Smoking status: Former Smoker     Years: 10.00     Types: Cigarettes     Quit date: 10/17/1997   . Smokeless tobacco: Never Used   . Alcohol use Yes      Comment: <1/week   . Drug use: No   . Sexual activity: Not Asked     Other Topics Concern   . None     Social History Narrative   . None       Allergies:  Allergies   Allergen Reactions   . Ciprofloxacin Nausea Only       Review of Systems     Review of Systems   Constitutional: Negative for chills and fever.   HENT: Negative for congestion.    Eyes: Negative for discharge and redness.   Respiratory: Negative for cough and shortness of breath.    Cardiovascular: Negative  for chest pain.   Gastrointestinal: Negative for abdominal pain and vomiting.   Musculoskeletal: Positive for joint pain. Negative for back pain and neck pain.   Skin: Negative for itching and rash.   Neurological: Negative for dizziness.   Endo/Heme/Allergies: Does not bruise/bleed easily.       Physical Exam     Vitals:    01/24/18 1519   BP: 155/75   Pulse: 76   Resp: 18   Temp: 98.6 F (37 C)   TempSrc: Oral   SpO2: 97%   Weight: 80.7 kg   Height: 5\' 10"  (1.778 m)       Physical Exam   Constitutional: She is oriented to person, place, and time. She appears well-developed and well-nourished.   nontoxic   HENT:   Head: Normocephalic and atraumatic.   Eyes: Conjunctivae are normal. Right eye exhibits no discharge. Left eye exhibits no discharge.   Neck: Normal range of motion. Neck supple.   Cardiovascular: Normal rate and regular rhythm.    Pulmonary/Chest: Effort normal and breath sounds normal. No respiratory distress.   Musculoskeletal: Normal range of motion.        Legs:  Neurological: She is alert and oriented to person, place, and time.   Skin: Skin is warm and dry.   Psychiatric: She has a normal mood and affect.   Nursing note and vitals reviewed.      Diagnostic Study Results     Labs -     Results     ** No results found for the last  24 hours. **          Radiologic Studies -   Radiology Results (24 Hour)     Procedure Component Value Units Date/Time    [place] VenoDopp Low Extremity Left [[id number]] Collected:  01/24/18 1701    Order Status:  Completed Updated:  01/24/18 1707    Narrative:       US VENOUS LOW EXTREM DUPLX DOPP UNI LEFT    CLINICAL INDICATION:   LEG SWELLING, PAIN, SUSPECT DVT    COMPARISON: None    TECHNIQUE:  Duplex venous ultrasound examination of the Left lower  extremity was performed.    FINDINGS:  There is good compression, augmentation and flow seen in the  deep veins of the lower extremity. No evidence for occlusive thrombus in  the deep veins. No evidence for nonocclusive venous  thrombosis. The  Right common femoral vein was also scanned, and there was no thrombus  seen in this segment.        Impression:         NO EVIDENCE OF DEEP VENOUS THROMBOSIS IN THE  LEFT LOWER  EXTREMITY.         [place]  Teferra, MD   01/24/2018 5:02 PM    Knee 4+ Views Left [[id number]] Collected:  01/24/18 1622    Order Status:  Completed Updated:  01/24/18 1629    Narrative:       XR KNEE LEFT 4+ VIEWS    CLINICAL INDICATION:   knee pain    COMPARISON: 03/23/2014      Impression:       FINDINGS and      There is no evidence for fracture or dislocation of the left knee. There  is suggestion of a small knee effusion. There are degenerative changes  of the left knee again noted with associated subchondral cystic of the  lateral femoral tibial compartment and patella which have shown interval  progression compared to the prior study.    Sandie Ano, MD   01/24/2018 4:25 PM      .    Medical Decision Making   I am the first provider for this patient.    I reviewed the vital signs, available nursing notes, past medical history, past surgical history, family history and social history.    Vital Signs-Reviewed the patient's vital signs.     Patient Vitals for the past 12 hrs:   BP Temp Pulse Resp   01/24/18 1519 155/75 98.6 F (37 C) 76 18       Pulse Oximetry Analysis - Normal 97 % on RA    Clinical Decision Support:       ED Course:    Tylenol 1 g po given   Ace wrap applied to L knee     Provider Notes:    67 y.o. female with c/o chronic  L knee no DVT, no concern for septic arthritis. Will refer to ortho.     Rx use and side effects, results, home self care, discharge instructions, and return precautions discussed extensively with patient. Possibility of evolving illness reviewed. All questions solicited and addressed. Patient is amenable to discharge.     Discharge Prescriptions     Medication Sig Dispense Auth. Provider    naloxone [facility]) 4 MG/0.1ML nasal spray 1 spray intranasally. If pt does not respond or  relapses into respiratory depression call 911. Give additional doses every 2-3 min. 2 each Janace Hoard T, PA    acetaminophen-codeine (TYLENOL #3) 300-30  MG per tablet Take 1 tablet by mouth every 6 (six) hours as needed for Pain (or cough).for up to 12 doses 12 tablet Janace Hoard T, [place]            Diagnosis     Clinical Impression:   1. Left knee pain, unspecified chronicity    2. small Knee effusion, left        Treatment Plan:   ED Disposition     ED Disposition Condition Date/Time Comment    Discharge  Mon Jan 24, 2018  5:26 PM Chetara Yetta Barre discharge to home/self care.    Condition at disposition: Stable            _______________________________    CHART OWNERSHIPOpal Sidles, PA-C, am the primary clinician of record.  _______________________________       Sena Hitch, PA  01/24/18 [phone]

## 2018-01-24 NOTE — ED Triage Notes (Signed)
See quick triage

## 2018-01-24 NOTE — Discharge Instructions (Signed)
Knee Pain NOS     You have been seen for knee pain.     There are a few causes for knee pain. The doctor feels your knee pain is not from an injury to your knee's bones or ligaments.      Injury to the ligaments or bones is not the only cause of knee pain. There are other causes. These include:  · Tendonitis. This is the inflammation (swelling) of the tendons. Tendons are the thick cords that connect the muscles around the knee to the bones of the knee joint.  · Bursitis. This is the inflammation (swelling) of the fluid-filled sacs that cushion the knee joint.  · Arthritis (inflammation of joints).  · Gout (swelling of the joints).  · Knee injuries from overuse.     Some things you can do to treat your knee pain are:  · Apply ice to the knee with an ice pack. Be sure to put a towel between the ice pack and your skin. NEVER PLACE DIRECTLY ON YOUR SKIN. You can do this for 15 minutes at a time, several times a day.  · Use anti-inflammatory medicine like ibuprofen (Advil® or Motrin®) to help the pain and swelling.  · Avoid doing things that put a lot of stress on your knee joints. This includes running or playing tennis.     YOU SHOULD SEEK MEDICAL ATTENTION IMMEDIATELY, EITHER HERE OR AT THE NEAREST EMERGENCY DEPARTMENT, IF ANY OF THE FOLLOWING OCCUR:  · Your knee pain gets worse.  · You have fever (temperature higher than 100.4ºF / 38ºC) or chills or your knee gets more red or warm.  · You have any other problems or concerns.

## 2018-03-16 ENCOUNTER — Ambulatory Visit (INDEPENDENT_AMBULATORY_CARE_PROVIDER_SITE_OTHER): Payer: Medicare Other | Admitting: Neurology

## 2018-03-16 ENCOUNTER — Encounter (INDEPENDENT_AMBULATORY_CARE_PROVIDER_SITE_OTHER): Payer: Self-pay | Admitting: Neurology

## 2018-03-16 VITALS — BP 156/81 | HR 70 | Resp 16 | Ht 70.0 in | Wt 178.0 lb

## 2018-03-16 DIAGNOSIS — G4752 REM sleep behavior disorder: Secondary | ICD-10-CM

## 2018-03-16 DIAGNOSIS — G4700 Insomnia, unspecified: Secondary | ICD-10-CM

## 2018-03-16 DIAGNOSIS — G20 Parkinson's disease: Secondary | ICD-10-CM

## 2018-03-16 DIAGNOSIS — R251 Tremor, unspecified: Secondary | ICD-10-CM

## 2018-03-16 MED ORDER — CARBIDOPA-LEVODOPA 25-100 MG PO TABS
1.00 | ORAL_TABLET | Freq: Three times a day (TID) | ORAL | 2 refills | Status: DC
Start: 2018-03-16 — End: 2018-05-20

## 2018-03-16 MED ORDER — RASAGILINE MESYLATE 1 MG PO TABS
1.00 mg | ORAL_TABLET | Freq: Every day | ORAL | 3 refills | Status: DC
Start: 2018-03-16 — End: 2018-09-12

## 2018-03-16 MED ORDER — ROTIGOTINE 6 MG/24HR TD PT24
1.00 | MEDICATED_PATCH | Freq: Every day | TRANSDERMAL | 3 refills | Status: DC
Start: 2018-03-16 — End: 2018-04-12

## 2018-03-16 NOTE — Progress Notes (Signed)
[facility]    Subjective:      CC: Tremor    67 y.o.  F presenting to neurology clinic for f/u of tremors , and probable Parkinson's disease.  He is using the Neupro 6 mg patch as well as the Azilect.  She still feels like the tremors on her left side are problematic.  They are not as bad all the time, but when they are present they are more prevalent and she is noticing them more in her leg as well.   She denies any side effects to Azilect or Neupro.   No nausea, drowsiness, dizziness, sleep attacks, impulsivity, compulsivity, or leg edema.     Walking well, no falls.  Tremors are troublesome, but generally she can still use her hands well without a lot of difficulty with fine motor movements.  Constipation is a bit better.  She has not been sleeping as well recently and has been having more vivid dreams. Occassional RBD, but never fallen out of bed.     No other new symptoms or complaints.    Patient denies smoking, drinks occasionally.  She has not noticed that alcohol makes any change to her tremors.  No drug use.  Her father had Parkinson's disease and is deceased. No other family history of tremors.        Retained from initial Consultation:   Patient states that she has had tremors in her hands for years, but over the last year or so they have gotten much worse.  She feels like the tremors occur all the time in her hands and legs, and when she is lying down at night.  She feels like her whole body is shaking.  She was previously diagnosed with essential tremor.  She was tried on propranolol, but it did not seem to help at all.  She was tried on primidone, but even a low dose made her feel sick.  She is taking topiramate for weight loss, but it does not seem to help her tremor.  She has some stiffness in her right shoulder, and she thinks this is from her psoriatic arthritis.  No other notable stiffness in arms and legs.  No change in gait, no significant instability.  No falls.  She  does have long-standing constipation, and works with a gastroenterologist.  Occasional neck pain and back pain, but no shooting pain down arms or legs.  She does have occasional numbness in her left leg, simply in the left foot.  Denies any weakness in her arms and legs.    Denies anosmia.  No RBD.  She does often have trouble sleeping and wakes up overnight many nights to go to the restroom.  Denies any memory loss.  Denies hallucinations.         The following portions of the patient's history were reviewed and updated as appropriate: allergies, current medications, past family history, past medical history, past social history, past surgical history and problem list.    Review of Systems  Per HPI.  No recent illness.  Denies fever, chills, cough, sinus pain, eye pain, eye redness, ear pain, rhinorrhea, sore throat, chest pain, SOB, wheezing, abd pain, Nausea, Vomiting, diarrhea,  dysuria, or rashes.  All other ROS negative.       Objective:     Vitals:    03/16/18 1337   BP: 156/81   Pulse: 70   Resp: 16         Constitutional: NAD   Eyes: Clear conjunctiva  Neck: Nontender, full ROM, No Lhermitte's sign  Cardiovascular: RRR  Resipratory: CTA B   Musculoskeletal: Normal muscle bulk.  No muscle pain  Integumentary: No abnormal rash noted  Psych: Normal affect    Neurological:   Mental Status: Alert & oriented to person, place, month, & year.   Language: Spontaneous & fluent speech, good comprehension.    Cranial Nerves:  II, III: PERRL, VFF  III, IV, VI: EOMI, No nystagmus, no palsies, no ptosis  V: Intact to LT V1-V3 distribution bilaterally.   VI: Symmetrical face and expression.   VIII: Hearing intact to finger rub bilaterally.   IX, X: Palate/Uvula elevates symmetrically.   XI: 5/5 Trapezius & SCM bilaterally.   XII: Tongue is midline.     Motor Exam: Normal bulk and tone. No clonus. No pronator drift. Strength 5/5 throughout and symmetric.No resting tremor, postural tremor bilaterally, slightly worse on  right.  Normal fine motor movements, no definite increase in tone and no definite cogwheeling.    Sensation: Sensation to LT intact grossly throughout symmetrically. Romberg negative    Cerebellar:   Finger to Nose: Mild postural intention tremor bilaterally.  No dysmetria.    Reflexes: 2+ throughout, symmetric, with down-going toes.    Station/ Gait: Normal with an stride length.  Decreased arm swing with some re-emergent tremor on right.    UPDRS-10    Assessment:     Patient with long-standing tremors and possible restless leg syndrome.  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  She was started on Neupro, and is tolerating it well.   Also now on Azilect to 1 mg.  Still having notable tremor, so we will add a low-dose Sinemet.    Tremors and RLS are somewhat better, and with no side effects to current regimen and now on Neupro to 6 mg.  Her tremors are worsened if she is stressed, and are reprotedly worse over the past week.     EMG/NCS was normal w/ no evidence of neuropathy or radiculopathy.  Plan:     Chart Sinemet half tab 3 times daily, in 2 weeks increase to 1 tab 3 times daily  Cont Neupro to 6 mg daily.  Cont Azilect  To 1 mg daily.  Discussed possible risks, side effects and interactions.  Provided recipe for constipation cocktail.   Continue low dose clonazepam for sleep trouble ans RBD      RTC in 2-3 months. Patient can follow up sooner if needed. In the meantime, patient will contact the office with any questions or concerns.     -----------------------------------------------------------    Additional notes and data scanned including patient questionnaire which may contain pertinent information to visit.         Kateri Plummer, MD, PhD  Co-Director  Movement Disorders Specialist  [facility]  [facility]    [address]  [address]  [place] [address]    [place], [place] [address]  T [phone]  F [phone]   T  [phone]  F [phone]     [email or web address]

## 2018-03-18 ENCOUNTER — Encounter (INDEPENDENT_AMBULATORY_CARE_PROVIDER_SITE_OTHER): Payer: Self-pay | Admitting: Neurology

## 2018-03-22 ENCOUNTER — Other Ambulatory Visit: Payer: Self-pay | Admitting: Nurse Practitioner

## 2018-03-22 DIAGNOSIS — K219 Gastro-esophageal reflux disease without esophagitis: Secondary | ICD-10-CM

## 2018-03-29 ENCOUNTER — Other Ambulatory Visit: Payer: Self-pay | Admitting: Rheumatology

## 2018-03-29 ENCOUNTER — Ambulatory Visit
Admission: RE | Admit: 2018-03-29 | Discharge: 2018-03-29 | Disposition: A | Discharge status: Home / Self Care | Payer: Medicare Other | Source: Ambulatory Visit | Attending: Rheumatology | Admitting: Rheumatology

## 2018-03-29 DIAGNOSIS — M7989 Other specified soft tissue disorders: Secondary | ICD-10-CM

## 2018-03-29 DIAGNOSIS — M79643 Pain in unspecified hand: Secondary | ICD-10-CM | POA: Insufficient documentation

## 2018-04-01 ENCOUNTER — Ambulatory Visit
Admission: RE | Admit: 2018-04-01 | Discharge: 2018-04-01 | Disposition: A | Discharge status: Home / Self Care | Payer: Medicare Other | Source: Ambulatory Visit | Attending: Nurse Practitioner | Admitting: Nurse Practitioner

## 2018-04-01 DIAGNOSIS — K219 Gastro-esophageal reflux disease without esophagitis: Secondary | ICD-10-CM | POA: Insufficient documentation

## 2018-04-01 MED ORDER — BARIUM SULFATE 60 % PO SUSP
80.00 mL | Freq: Once | ORAL | Status: AC | PRN
Start: 2018-04-01 — End: 2018-04-01
  Administered 2018-04-01: 10:00:00 80 mL via ORAL

## 2018-04-01 MED ORDER — SOD BICARB-CITRIC AC-SIMETH 2.21-1.53-0.04 G PO PACK
1.00 | PACK | Freq: Once | ORAL | Status: AC | PRN
Start: 2018-04-01 — End: 2018-04-01
  Administered 2018-04-01: 10:00:00 1 via ORAL

## 2018-04-01 MED ORDER — BARIUM SULFATE 98 % PO SUSR
100.00 mL | Freq: Once | ORAL | Status: AC | PRN
Start: 2018-04-01 — End: 2018-04-01
  Administered 2018-04-01: 10:00:00 100 mL via ORAL

## 2018-04-06 ENCOUNTER — Other Ambulatory Visit (INDEPENDENT_AMBULATORY_CARE_PROVIDER_SITE_OTHER): Payer: Self-pay

## 2018-04-12 ENCOUNTER — Other Ambulatory Visit (INDEPENDENT_AMBULATORY_CARE_PROVIDER_SITE_OTHER): Payer: Self-pay

## 2018-04-12 DIAGNOSIS — G20 Parkinson's disease: Secondary | ICD-10-CM

## 2018-04-12 MED ORDER — ROTIGOTINE 6 MG/24HR TD PT24
1.00 | MEDICATED_PATCH | Freq: Every day | TRANSDERMAL | 3 refills | Status: DC
Start: 2018-04-12 — End: 2018-05-20

## 2018-05-20 ENCOUNTER — Ambulatory Visit (INDEPENDENT_AMBULATORY_CARE_PROVIDER_SITE_OTHER): Payer: Medicare Other | Admitting: Neurology

## 2018-05-20 ENCOUNTER — Encounter (INDEPENDENT_AMBULATORY_CARE_PROVIDER_SITE_OTHER): Payer: Self-pay | Admitting: Neurology

## 2018-05-20 VITALS — BP 146/77 | HR 66 | Ht 70.0 in | Wt 177.0 lb

## 2018-05-20 DIAGNOSIS — G2581 Restless legs syndrome: Secondary | ICD-10-CM

## 2018-05-20 DIAGNOSIS — G4752 REM sleep behavior disorder: Secondary | ICD-10-CM

## 2018-05-20 DIAGNOSIS — G4700 Insomnia, unspecified: Secondary | ICD-10-CM

## 2018-05-20 DIAGNOSIS — G20 Parkinson's disease: Secondary | ICD-10-CM

## 2018-05-20 DIAGNOSIS — R251 Tremor, unspecified: Secondary | ICD-10-CM

## 2018-05-20 MED ORDER — ROTIGOTINE 6 MG/24HR TD PT24
1.00 | MEDICATED_PATCH | Freq: Every day | TRANSDERMAL | 3 refills | Status: DC
Start: 2018-05-20 — End: 2018-06-21

## 2018-05-20 MED ORDER — CARBIDOPA-LEVODOPA 25-100 MG PO TABS
1.00 | ORAL_TABLET | Freq: Three times a day (TID) | ORAL | 3 refills | Status: DC
Start: 2018-05-20 — End: 2018-06-21

## 2018-05-20 NOTE — Progress Notes (Signed)
[facility]    Subjective:      CC: Tremor    67 y.o.  F presenting to neurology clinic for f/u of tremors , and probable Parkinson's disease.  She thinks she is doing better.  She is currently taking the Azilect, Neupro 6 mg patch and Sinemet 1 tab 3 times daily.  This accommodation does seem to be helping.  There are times when her hand will feel a bit shakier, but generally much better than in the past.  No definite wearing off between doses of medication  She denies any side effects to Sinemet, Azilect or Neupro.   No nausea, drowsiness, dizziness, sleep attacks, impulsivity, compulsivity, or leg edema.     Walking well, no falls.   Constipation is a bit better.  She has not been sleeping as well recently and has been having more vivid dreams. Occassional RBD, but never fallen out of bed.     No other new symptoms or complaints.    Patient denies smoking, drinks occasionally.  She has not noticed that alcohol makes any change to her tremors.  No drug use.  Her father had Parkinson's disease and is deceased. No other family history of tremors.        Retained from initial Consultation:   Patient states that she has had tremors in her hands for years, but over the last year or so they have gotten much worse.  She feels like the tremors occur all the time in her hands and legs, and when she is lying down at night.  She feels like her whole body is shaking.  She was previously diagnosed with essential tremor.  She was tried on propranolol, but it did not seem to help at all.  She was tried on primidone, but even a low dose made her feel sick.  She is taking topiramate for weight loss, but it does not seem to help her tremor.  She has some stiffness in her right shoulder, and she thinks this is from her psoriatic arthritis.  No other notable stiffness in arms and legs.  No change in gait, no significant instability.  No falls.  She does have long-standing constipation, and works with a  gastroenterologist.  Occasional neck pain and back pain, but no shooting pain down arms or legs.  She does have occasional numbness in her left leg, simply in the left foot.  Denies any weakness in her arms and legs.    Denies anosmia.  No RBD.  She does often have trouble sleeping and wakes up overnight many nights to go to the restroom.  Denies any memory loss.  Denies hallucinations.         The following portions of the patient's history were reviewed and updated as appropriate: allergies, current medications, past family history, past medical history, past social history, past surgical history and problem list.    Review of Systems  Per HPI.  No recent illness.  Denies fever, chills, cough, sinus pain, eye pain, eye redness, ear pain, rhinorrhea, sore throat, chest pain, SOB, wheezing, abd pain, Nausea, Vomiting, diarrhea,  dysuria, or rashes.  All other ROS negative.       Objective:     Vitals:    05/20/18 1124   BP: 146/77   Pulse: 66       Constitutional: NAD   Eyes: Clear conjunctiva  Neck: Nontender, full ROM, No Lhermitte's sign  Cardiovascular: RRR  Resipratory: CTA B   Musculoskeletal: Normal muscle bulk.  No muscle pain  Integumentary: No abnormal rash noted  Psych: Normal affect    Neurological:   Mental Status: Alert & oriented to person, place, month, & year.   Language: Spontaneous & fluent speech, good comprehension.    Cranial Nerves:  II, III: PERRL, VFF  III, IV, VI: EOMI, No nystagmus, no palsies, no ptosis  V: Intact to LT V1-V3 distribution bilaterally.   VI: Symmetrical face and expression.   VIII: Hearing intact to finger rub bilaterally.   IX, X: Palate/Uvula elevates symmetrically.   XI: 5/5 Trapezius & SCM bilaterally.   XII: Tongue is midline.     Motor Exam: Normal bulk and tone. No clonus. No pronator drift. Strength 5/5 throughout and symmetric.No resting tremor, postural tremor bilaterally, slightly worse on right.  Normal fine motor movements, no definite increase in tone and no  definite cogwheeling.    Sensation: Sensation to LT intact grossly throughout symmetrically. Romberg negative    Cerebellar:   Finger to Nose: Mild postural intention tremor bilaterally.  No dysmetria.    Reflexes: 2+ throughout, symmetric, with down-going toes.    Station/ Gait: Normal with an stride length.  Decreased arm swing with some re-emergent tremor on right.    UPDRS-8    Assessment:     Patient with long-standing tremors and possible restless leg syndrome.  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  She was started on Neupro, and is tolerating it well.   Also now on Azilect to 1 mg.  Still having notable tremor, so we will add a low-dose Sinemet.    Tremors and RLS are somewhat better, and with no side effects to current regimen and now on Neupro to 6 mg.  Her tremors are worsened if she is stressed, and are reprotedly worse over the past week.     EMG/NCS was normal w/ no evidence of neuropathy or radiculopathy.  Plan:     cont Sinemet 1 tab 3 times daily  Cont Neupro to 6 mg daily.  Cont Azilect  To 1 mg daily.  Discussed possible risks, side effects and interactions.  Provided recipe for constipation cocktail.   Continue low dose clonazepam for sleep trouble and RBD      RTC in 2-3 months. Patient can follow up sooner if needed. In the meantime, patient will contact the office with any questions or concerns.     -----------------------------------------------------------    Additional notes and data scanned including patient questionnaire which may contain pertinent information to visit.         Kateri Plummer, MD, PhD  Co-Director  Movement Disorders Specialist  [facility]  [facility]    [address]  [address]  [place] [address]    [place], [place] [address]  T [phone]  F [phone]   T [phone]  F [phone]     [email or web address]

## 2018-06-21 ENCOUNTER — Other Ambulatory Visit (INDEPENDENT_AMBULATORY_CARE_PROVIDER_SITE_OTHER): Payer: Self-pay

## 2018-06-21 DIAGNOSIS — G20 Parkinson's disease: Secondary | ICD-10-CM

## 2018-06-21 MED ORDER — CARBIDOPA-LEVODOPA 25-100 MG PO TABS
1.00 | ORAL_TABLET | Freq: Three times a day (TID) | ORAL | 3 refills | Status: DC
Start: 2018-06-21 — End: 2018-09-12

## 2018-06-21 MED ORDER — ROTIGOTINE 6 MG/24HR TD PT24
1.00 | MEDICATED_PATCH | Freq: Every day | TRANSDERMAL | 3 refills | Status: DC
Start: 2018-06-21 — End: 2018-09-12

## 2018-09-12 ENCOUNTER — Ambulatory Visit (INDEPENDENT_AMBULATORY_CARE_PROVIDER_SITE_OTHER): Payer: Medicare Other | Admitting: Neurology

## 2018-09-12 ENCOUNTER — Encounter (INDEPENDENT_AMBULATORY_CARE_PROVIDER_SITE_OTHER): Payer: Self-pay | Admitting: Neurology

## 2018-09-12 VITALS — BP 147/72 | HR 87 | Resp 18 | Ht 70.0 in | Wt 177.0 lb

## 2018-09-12 DIAGNOSIS — R200 Anesthesia of skin: Secondary | ICD-10-CM

## 2018-09-12 DIAGNOSIS — G20 Parkinson's disease: Secondary | ICD-10-CM

## 2018-09-12 DIAGNOSIS — R208 Other disturbances of skin sensation: Secondary | ICD-10-CM

## 2018-09-12 DIAGNOSIS — M5442 Lumbago with sciatica, left side: Secondary | ICD-10-CM

## 2018-09-12 DIAGNOSIS — G4700 Insomnia, unspecified: Secondary | ICD-10-CM

## 2018-09-12 DIAGNOSIS — R251 Tremor, unspecified: Secondary | ICD-10-CM

## 2018-09-12 DIAGNOSIS — G4752 REM sleep behavior disorder: Secondary | ICD-10-CM

## 2018-09-12 DIAGNOSIS — G8929 Other chronic pain: Secondary | ICD-10-CM

## 2018-09-12 MED ORDER — ROTIGOTINE 6 MG/24HR TD PT24
1.00 | MEDICATED_PATCH | Freq: Every day | TRANSDERMAL | 3 refills | Status: DC
Start: 2018-09-12 — End: 2019-06-21

## 2018-09-12 MED ORDER — CARBIDOPA-LEVODOPA 25-100 MG PO TABS
1.00 | ORAL_TABLET | Freq: Three times a day (TID) | ORAL | 3 refills | Status: DC
Start: 2018-09-12 — End: 2019-01-06

## 2018-09-12 MED ORDER — RASAGILINE MESYLATE 1 MG PO TABS
1.00 mg | ORAL_TABLET | Freq: Every day | ORAL | 3 refills | Status: DC
Start: 2018-09-12 — End: 2019-11-23

## 2018-09-12 NOTE — Progress Notes (Signed)
[email or web address]    Subjective:      CC: Tremor    67 y.o.  F presenting to neurology clinic for f/u of tremors , and probable Parkinson's disease.  From a movement standpoint she feels that she is doing well.  Her main complaint is still the tremor in her right hand.  There is sometimes it does not bother her at all.  Other times, it can be a bit worse especially if she is stressed or tired.  She is dealing with an aging mother's that does bring out the stress and the tremor.  Otherwise, walking well and moving well.  No falls.    She occasionally gets some pain in her left foot, somewhat like electric shocks with tingles in her left foot.  She does have some back pain in the left side in particular, but no shooting pain down legs.  No persistent numbness in either leg or foot.  No specific leg weakness.      No definite wearing off between doses of medication  She denies any side effects to Sinemet, Azilect or Neupro.   No nausea, drowsiness, dizziness, sleep attacks, impulsivity, compulsivity, or leg edema.     Walking well, no falls.   Constipation is a bit better.  Been somewhat better with the clonazepam.  No recent RBD.      Patient denies smoking, drinks occasionally.  She has not noticed that alcohol makes any change to her tremors.  No drug use.  Her father had Parkinson's disease and is deceased. No other family history of tremors.        Retained from initial Consultation:   Patient states that she has had tremors in her hands for years, but over the last year or so they have gotten much worse.  She feels like the tremors occur all the time in her hands and legs, and when she is lying down at night.  She feels like her whole body is shaking.  She was previously diagnosed with essential tremor.  She was tried on propranolol, but it did not seem to help at all.  She was tried on primidone, but even a low dose made her feel sick.  She is taking topiramate for weight loss, but it does  not seem to help her tremor.  She has some stiffness in her right shoulder, and she thinks this is from her psoriatic arthritis.  No other notable stiffness in arms and legs.  No change in gait, no significant instability.  No falls.  She does have long-standing constipation, and works with a gastroenterologist.  Occasional neck pain and back pain, but no shooting pain down arms or legs.  She does have occasional numbness in her left leg, simply in the left foot.  Denies any weakness in her arms and legs.    Denies anosmia.  No RBD.  She does often have trouble sleeping and wakes up overnight many nights to go to the restroom.  Denies any memory loss.  Denies hallucinations.         The following portions of the patient's history were reviewed and updated as appropriate: allergies, current medications, past family history, past medical history, past social history, past surgical history and problem list.    Review of Systems  Per HPI.  No recent illness.  Denies fever, chills, cough, sinus pain, eye pain, eye redness, ear pain, rhinorrhea, sore throat, chest pain, SOB, wheezing, abd pain, Nausea, Vomiting, diarrhea,  dysuria, or rashes.  All  other ROS negative.       Objective:     Vitals:    09/12/18 0952   BP: 147/72   Pulse: 87   Resp: 18       Constitutional: NAD   Eyes: Clear conjunctiva  Neck: Nontender, full ROM, No Lhermitte's sign  Cardiovascular: RRR  Resipratory: CTA B   Musculoskeletal: Normal muscle bulk.  No muscle pain  Integumentary: No abnormal rash noted  Psych: Normal affect    Neurological:   Mental Status: Alert & oriented to person, place, month, & year.   Language: Spontaneous & fluent speech, good comprehension.    Cranial Nerves:  II, III: PERRL, VFF  III, IV, VI: EOMI, No nystagmus, no palsies, no ptosis  V: Intact to LT V1-V3 distribution bilaterally.   VI: Symmetrical face and expression.   VIII: Hearing intact to finger rub bilaterally.   IX, X: Palate/Uvula elevates symmetrically.   XI:  5/5 Trapezius & SCM bilaterally.   XII: Tongue is midline.     Motor Exam: Normal bulk and tone. No clonus. No pronator drift. Strength 5/5 throughout and symmetric.No resting tremor, postural tremor bilaterally, slightly worse on right.  Normal fine motor movements, no definite increase in tone and no definite cogwheeling.    Sensation: Sensation to LT intact grossly throughout symmetrically. Romberg negative    Cerebellar:   Finger to Nose: Mild postural intention tremor bilaterally.  No dysmetria.    Reflexes: 2+ throughout, symmetric, with down-going toes.    Station/ Gait: Normal with an stride length.  Decreased arm swing with some re-emergent tremor on right.    UPDRS-9    Assessment:     Patient with long-standing tremors and possible restless leg syndrome.  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  She was started on Neupro, and is tolerating it well.   Also now on Azilect to 1 mg and Sinemet 25/101 tab 3 times daily.  This seems to be providing good benefit, and her main symptom is now on intermittent tremor in her right hand only.    Tremors and RLS are somewhat better, and with no side effects to current regimen and now on Neupro to 6 mg.  Her tremors are worsened if she is stressed, and are reprotedly worse over the past week.     Prior EMG/NCS was normal w/ no evidence of neuropathy or radiculopathy.  However, a fair amount of time is past, and she is having new symptoms especially in her left foot.  Is unclear if this is worsening lumbar radiculopathy, or newly developing neuropathy.  Plan:     cont Sinemet 1 tab 3 times daily  Cont Neupro to 6 mg daily.  Cont Azilect  To 1 mg daily.  Discussed possible risks, side effects and interactions.  Provided recipe for constipation cocktail.   Continue low dose clonazepam for sleep trouble and RBD    EMG/NCS to differentiate radiculopathy from neuropathy in legs      RTC in 2-3 months. Patient can follow up sooner if needed. In the meantime,  patient will contact the office with any questions or concerns.     -----------------------------------------------------------    Additional notes and data scanned including patient questionnaire which may contain pertinent information to visit.         Kateri Plummer, MD, PhD  Co-Director  Movement Disorders Specialist  [facility]  [facility]    [address] [place] [place]  [facility]  [address]  ,[place] [address]    [place], [place] [address]  T [phone]  F [phone]   T [phone]  F [phone]     [email or web address]

## 2018-10-26 ENCOUNTER — Emergency Department
Admission: EM | Admit: 2018-10-26 | Discharge: 2018-10-26 | Disposition: A | Discharge status: Home / Self Care | Payer: Medicare Other | Attending: Emergency Medicine | Admitting: Emergency Medicine

## 2018-10-26 ENCOUNTER — Emergency Department: Payer: Medicare Other

## 2018-10-26 DIAGNOSIS — Z86711 Personal history of pulmonary embolism: Secondary | ICD-10-CM | POA: Insufficient documentation

## 2018-10-26 DIAGNOSIS — J40 Bronchitis, not specified as acute or chronic: Secondary | ICD-10-CM | POA: Insufficient documentation

## 2018-10-26 DIAGNOSIS — G20 Parkinson's disease: Secondary | ICD-10-CM | POA: Insufficient documentation

## 2018-10-26 DIAGNOSIS — Z923 Personal history of irradiation: Secondary | ICD-10-CM | POA: Insufficient documentation

## 2018-10-26 DIAGNOSIS — Z9221 Personal history of antineoplastic chemotherapy: Secondary | ICD-10-CM | POA: Insufficient documentation

## 2018-10-26 DIAGNOSIS — Z853 Personal history of malignant neoplasm of breast: Secondary | ICD-10-CM | POA: Insufficient documentation

## 2018-10-26 MED ORDER — AZITHROMYCIN 250 MG PO TABS
500.00 mg | ORAL_TABLET | Freq: Once | ORAL | Status: AC
Start: 2018-10-26 — End: 2018-10-26
  Administered 2018-10-26: 17:00:00 500 mg via ORAL
  Filled 2018-10-26: qty 2

## 2018-10-26 MED ORDER — BENZONATATE 100 MG PO CAPS
100.00 mg | ORAL_CAPSULE | Freq: Three times a day (TID) | ORAL | 0 refills | Status: DC | PRN
Start: 2018-10-26 — End: 2019-07-20

## 2018-10-26 MED ORDER — AZITHROMYCIN 250 MG PO TABS
250.00 mg | ORAL_TABLET | Freq: Every day | ORAL | 0 refills | Status: AC
Start: 2018-10-26 — End: 2018-10-30

## 2018-10-26 NOTE — ED Provider Notes (Signed)
EMERGENCY DEPARTMENT HISTORY AND PHYSICAL EXAM    Date: 10/26/2018  Patient Name: Yvette Anthony  Attending Physician: Maryella Shivers, MD  Mid-level: Franciso [place] PA-C    History of Presenting Illness    Physician/Midlevel provider first contact with patient: 10/26/18 1611         Chief Complaint   Patient presents with   . Cough       History Provided By: Patient     Chief Complaint: productive cough of green mucous, generalized weakness  Onset: 4 days   Timing:   Location: chest  Quality: congested cough  Severity: mod  Modifying Factors: Nyquil OTC     Additional History: Yvette Anthony is a 67 y.o. female w/ PMH of breast CA (17 yr ago), Parkinson's, Psoriatic Arthritis on MTX, PE with c/o generalized weakness over last few days, and  cough productive of dark green phlegm.  "I feel rattling mucous in my chest." No measured fevers. Denies chills, night sweats, chest pain, n/v/d, myalgias, congestion or ST.      Patient reports receiving flu vaccine.   No hx of asthma.     PCP: Rudene Anda, MD    No current facility-administered medications for this encounter.     Current Outpatient Medications:   .  calcium citrate-vitamin D (CITRACAL+D) 315-200 MG-UNIT per tablet, Take 1 tablet by mouth 2 (two) times daily., Disp: , Rfl:   .  carbidopa-levodopa (SINEMET) 25-100 MG per tablet, Take 1 tablet by mouth 3 (three) times daily, Disp: 270 tablet, Rfl: 3  .  Cholecalciferol (VITAMIN D3) 1.25 MG (50000 UT) Cap, Take 1 tablet by mouth once a week, Disp: , Rfl: 1  .  folic acid (FOLVITE) 1 MG tablet, Take 1 mg by mouth daily., Disp: , Rfl:   .  methotrexate 2.5 MG tablet, Take 17.5 mg by mouth Q Sunday.  , Disp: , Rfl:   .  Omega-3 Fatty Acids (OMEGA-3 FISH OIL) 500 MG Cap, Take by mouth daily.  , Disp: , Rfl:   .  Probiotic Product (PROBIOTIC PO), Take by mouth daily., Disp: , Rfl:   .  Rasagiline Mesylate (AZILECT) 1 MG Tab, Take 1 tablet (1 mg total) by mouth daily, Disp: 90 tablet, Rfl: 3  .   rotigotine (NEUPRO) 6 MG/24HR patch, Place 1 patch onto the skin daily, Disp: 90 patch, Rfl: 3  .  acetaminophen-codeine (TYLENOL #3) 300-30 MG per tablet, Take 1 tablet by mouth every 6 (six) hours as needed for Pain (or cough).for up to 12 doses, Disp: 12 tablet, Rfl: 0  .  azithromycin (ZITHROMAX) 250 MG tablet, Take 1 tablet (250 mg total) by mouth daily for 4 doses, Disp: 4 tablet, Rfl: 0  .  benzonatate (TESSALON) 100 MG capsule, Take 1 capsule (100 mg total) by mouth 3 (three) times daily as needed for Cough, Disp: 15 capsule, Rfl: 0  .  clonazePAM (KLONOPIN) 0.5 MG tablet, Take 0.5 tablets (0.25 mg total) by mouth nightly. (Patient taking differently: Take 0.25 mg by mouth nightly as needed   ), Disp: 30 tablet, Rfl: 5  .  omeprazole (PRILOSEC) 40 MG capsule, Take 40 mg by mouth 2 (two) times daily., Disp: , Rfl:   .  topiramate (TOPAMAX) 50 MG tablet, Take 50 mg by mouth 2 (two) times daily., Disp: , Rfl:     Past Medical History     Past Medical History:   Diagnosis Date   . Breast cancer 2000  Left-no chemo//no  radiation    . Breast cancer 2005    right- +chemo LD 2005// + radiation LD 2006   . Inguinal hernia, right    . Parkinsons    . Post-operative nausea and vomiting    . Psoriatic arthritis 2016   . Pulmonary embolism 2000    post-op mastectomy     Past Surgical History:   Procedure Laterality Date   . BREAST LUMPECTOMY Right 2005   . BREAST RECONSTRUCTION Left 2001   . HERNIORRHAPHY, INGUINAL Right 08/13/2017    Procedure: HERNIORRHAPHY, INGUINAL;  Surgeon: Verneda Skill, MD;  Location: [facility];  Service: General;  Laterality: Right;  RIGHT INGUINAL HERNIA OPEN   . MASTECTOMY Left 2000   . MYOMECTOMY  1985       Family History     History reviewed. No pertinent family history.    Social History     Social History     Tobacco Use   . Smoking status: Former Smoker     Years: 10.00     Types: Cigarettes     Last attempt to quit: 10/17/1997     Years since quitting: 21.0   . Smokeless  tobacco: Never Used   Substance Use Topics   . Alcohol use: Yes     Comment: <1/week   . Drug use: No       Allergies     Allergies   Allergen Reactions   . Ciprofloxacin Nausea Only       Review of Systems     Review of Systems   Constitutional: Negative for chills, fever and malaise/fatigue.   HENT: Negative for congestion and sore throat.    Respiratory: Positive for cough and sputum production. Negative for hemoptysis, shortness of breath and wheezing.    Cardiovascular: Negative.  Negative for chest pain.   Gastrointestinal: Negative.  Negative for abdominal pain, diarrhea, nausea and vomiting.   Musculoskeletal: Negative.  Negative for myalgias.   Skin: Negative.    Neurological: Negative for dizziness and headaches.       Physical Exam   BP 161/70   Pulse 73   Temp 98.9 F (37.2 C) (Oral)   Resp 18   Ht 5\' 9"  (1.753 m)   Wt 80.7 kg   SpO2 99%   BMI 26.29 kg/m     Physical Exam   Constitutional: She is oriented to person, place, and time and well-developed, well-nourished, and in no distress.   HENT:   Head: Normocephalic and atraumatic.   Right Ear: External ear normal.   Left Ear: External ear normal.   Nose: Nose normal.   Mouth/Throat: Oropharynx is clear and moist.   Eyes: Pupils are equal, round, and reactive to light. Conjunctivae and EOM are normal.   Neck: Normal range of motion. Neck supple.   Cardiovascular: Normal rate, regular rhythm, normal heart sounds and intact distal pulses.   Pulmonary/Chest: Effort normal and breath sounds normal. No respiratory distress. She has no wheezes. She has no rales.   Rhonchi in L base.    Abdominal: Soft. Bowel sounds are normal. There is no abdominal tenderness.   Lymphadenopathy:     She has no cervical adenopathy.   Neurological: She is alert and oriented to person, place, and time.   Skin: Skin is warm and dry.   Psychiatric: Mood, memory, affect and judgment normal.   Nursing note and vitals reviewed.      Diagnostic Study Results  Labs -      Results     Procedure Component Value Units Date/Time    Rapid influenza A/B antigens [[id number]] Collected:  10/26/18 1623    Specimen:  Nasopharyngeal from Nasal Aspirate Updated:  10/26/18 1647    Narrative:       ORDER#: [id number]                                    ORDERED BY: Lucianne Muss, VINOD  SOURCE: Nasal Aspirate                               COLLECTED:  10/26/18 16:23  ANTIBIOTICS AT COLL.:                                RECEIVED :  10/26/18 16:32  Influenza Rapid Antigen A&B                FINAL       10/26/18 16:47  10/26/18   Negative for Influenza A and B             Reference Range: Negative            Radiologic Studies -   Radiology Results (24 Hour)     Procedure Component Value Units Date/Time    XR Chest 2 Views [[id number]] Collected:  10/26/18 1651    Order Status:  Completed Updated:  10/26/18 1655    Narrative:       History: cough    Technique: PA and Lateral        Findings:  The lungs appear clear.  There is no pneumothorax.  The heart is normal in size.    The mediastinum is within normal limits.             Impression:        No active disease is seen in the chest.    Laurena Slimmer, MD   10/26/2018 4:51 PM      .    Clinical Course in the Emergency Department     Consultations:      Re-evaluations:      Procedures:      Medical Decision Making   I am the first provider for this patient.    I reviewed the vital signs, available nursing notes, past medical history, past surgical history, family history and social history.    Vital Signs-Reviewed the patient's vital signs.     No data found.    Pulse Oximetry Interpretation: Normal SpO2: 99 % on RA    Provider's Notes:    Patient counseled on diagnosis, treatment plan, f/u plans, and signs and symptoms when to return to ED. Pt is stable and ready for discharge.     Bronchitis Adult - Yes antibiotics   He/she WAS prescribed an antibiotic for one of the follow reasons: Immunocompromised on Methotrexate       Diagnosis and Treatment Plan        Clinical Impression:   1. Bronchitis        The review of the patient's medications does not in any way constitute an endorsement by this clinician of their use, dosage, indications, route, efficacy, interactions, or other clinical parameters.     Ree Edman, [place]  10/27/18 1538  Maryella Shivers, MD  10/28/18 [phone]

## 2018-10-26 NOTE — Discharge Instructions (Signed)
How to Feel Better When You Have Acute Bronchitis (CHEST COLD)    A chest cold, also known as acute bronchitis, is diagnosed by your symptoms and the physical exam performed by your provider today. It is almost always caused by a virus (such as RSV, Adenovirus, Influenza viruses, Parainfluenza & others). The virus makes the airways in your lungs swell and produce mucus. This is the cause of your cough.     We are glad that you came in today to be evaluated for your concerning symptoms. We hope to make you feel better while you are recovering. We expect that you should start to improve in the next several weeks. A cough can last up to 3-6 weeks.     Medications to relieve your cough:  Guaifenesin (OTC)  Dextromethorphan (OTC)  Antihistamines (OTC)  Benzonatate (Rx)      Other techniques to relieve your cough:  Get plenty of rest  Drink plenty of fluids  Use a clean humidifier or cool mist vaporizer  Avoid smoking or other pollutants  Breathe in steam from a bowl of hot water or shower  Use lozenges  Use honey    Wash your hands regularly to stay healthy!    When should I come back?  If you develop worsening cough, persistent fever, trouble breathing, vomiting after coughing, symptoms lasting more than 3 weeks or any new symptoms develop please return to the Emergency Department for re-evaluation.     Will antibiotics help?  Your chest cold (bronchitis) is most likely caused by a virus. Many research studies have shown that antibiotics do not help bronchitis and in fact maybe be harmful. The CDC, the [employer]	[place] and several other professional organizations all recommend avoiding antibiotics for acute bronchitis. Risks of inappropriate antibiotic use include: antibiotic resistant infections, allergic reaction, infectious diarrhea (C. dif). All of these can be very serious and may require you to be hospitalized. Antibiotics are used to treat Bacterial Pneumonia, Pertussis (Whooping cough), Strep  throat, or Severe sinusitis. They are sometimes indicated in people with COPD, cystic fibrosis or other previously diagnosed lung disorders. We do not suspect that you have any of these today.

## 2018-10-26 NOTE — ED Triage Notes (Signed)
Pt c/o generalized weakness over last few days.  + cough with dark phlegm.  No measured fevers.

## 2018-11-24 ENCOUNTER — Encounter (INDEPENDENT_AMBULATORY_CARE_PROVIDER_SITE_OTHER): Payer: Medicare Other | Admitting: Neurology

## 2018-11-28 ENCOUNTER — Ambulatory Visit (INDEPENDENT_AMBULATORY_CARE_PROVIDER_SITE_OTHER): Payer: Medicare Other | Admitting: Neurology

## 2018-12-08 ENCOUNTER — Ambulatory Visit (INDEPENDENT_AMBULATORY_CARE_PROVIDER_SITE_OTHER): Payer: Medicare Other | Admitting: Neurology

## 2018-12-08 ENCOUNTER — Encounter (INDEPENDENT_AMBULATORY_CARE_PROVIDER_SITE_OTHER): Payer: Self-pay | Admitting: Neurology

## 2018-12-08 DIAGNOSIS — R449 Unspecified symptoms and signs involving general sensations and perceptions: Secondary | ICD-10-CM

## 2018-12-08 DIAGNOSIS — R4189 Other symptoms and signs involving cognitive functions and awareness: Secondary | ICD-10-CM

## 2018-12-08 NOTE — Progress Notes (Signed)
[facility]  [address]  [place], [place]  [address]  Phone: [phone]     Fax: [phone]          Full Name: Yvette Anthony Diabeties: no  Patient ID: [id number] Anti-Coag: no  Gender: Female Pacemaker: no  Date of Birth: 1951/10/24      Visit Date: 12/08/2018 11:20  Age: 68 Years 0 Months Old  Examining Physician: Maurine Minister, MD, JD, FAAN  Referring Physician: Kateri Plummer, MD  Height: 5 feet 10 inch  Weight: 178 lbs  History: Numbness in feet. Query neuropathy      Sensory NCS      Nerve / Sites Segments Rec. Site Onset Lat Peak Lat Ref. NP Amp Ref. Distance Velocity Temp.      ms ms ms V V cm m/s C   R Sural - Ankle (Calf)      Calf Calf - Ankle Ankle 3.07 3.91 ?4.20 7.2 ?5.0 14 46 33.4   L Sural - Ankle (Calf)      Calf Calf - Ankle Ankle 3.18 3.80 ?4.20 8.8 ?5.0 14 44 25.2   R Superficial peroneal - Ankle      Lat leg Lat leg - Ankle Ankle 2.86 3.85 ?4.40 12.9 ?5.0 14 49 33.4   L Superficial peroneal - Ankle      Lat leg Lat leg - Ankle Ankle 2.92 3.59 ?4.40 5.2 ?5.0 14 48 24.7               Motor NCS      Nerve / Sites Segments Muscle Latency Ref. Amplitude Ref. Distance Velocity Ref. Temp.      ms ms mV mV cm m/s m/s C   R Peroneal - EDB      Ankle Ankle - EDB EDB 4.22 ?6.20 8.8 ?2.0 9   33.4      Fib head Fib head - Ankle EDB 11.09  8.7  32.5 47 ?39 33.4      Pop fossa Pop fossa - Fib head EDB 13.59  8.5  12 48 ?39 33.4   L Peroneal - EDB      Ankle Ankle - EDB EDB 4.43 ?6.20 9.0 ?2.0 9   24.7      Fib head Fib head - Ankle EDB 11.41  7.5  33 47 ?39 24.8      Pop fossa Pop fossa - Fib head EDB 13.44  7.5  11.5 57 ?39 24.8   R Tibial - AH      Ankle Ankle - AH AH 5.16 ?6.00 7.0 ?3.0 11   33.3      Pop fossa Pop fossa - Ankle AH 13.28  5.6  45.5 56 ?39 33.3   L Tibial - AH      Ankle Ankle - AH AH 5.57 ?6.00 4.0 ?3.0 11   24.6      Pop fossa Pop fossa - Ankle AH 15.47  2.8  44 44 ?39 24.6               EMG Summary Table     Spontaneous MUAP  Recruitment Activation   Muscle Nerve Roots IA Fib PSW Fasc H.F. Amp Dur. Morphology Pattern Effort   L. Tibialis anterior Deep peroneal (Fibular) L4-L5 N None None None None N N Normal N Normal   L. Gastrocnemius (Medial head) Tibial S1-S2 N None None None None N N Normal N Normal   L. Vastus lateralis Femoral  L2-L4 N None None None None N N Normal N Normal           Summary:   Nerve conduction studies of both lower extremities were normal.     Concentric needle examination of selected left lower extremity muscles was normal.      Impression:  This study is normal. There is no electrophysiologic evidence for a peripheral neuropathy.              ------------------------------  Maurine Minister, MD, JD, [place]

## 2018-12-19 ENCOUNTER — Emergency Department: Payer: Medicare Other

## 2018-12-19 ENCOUNTER — Emergency Department
Admission: EM | Admit: 2018-12-19 | Discharge: 2018-12-19 | Disposition: A | Discharge status: Home / Self Care | Payer: Medicare Other | Attending: Emergency Medicine | Admitting: Emergency Medicine

## 2018-12-19 DIAGNOSIS — J9801 Acute bronchospasm: Secondary | ICD-10-CM | POA: Insufficient documentation

## 2018-12-19 DIAGNOSIS — G20 Parkinson's disease: Secondary | ICD-10-CM | POA: Insufficient documentation

## 2018-12-19 DIAGNOSIS — R058 Other specified cough: Secondary | ICD-10-CM

## 2018-12-19 LAB — GROUP A STREP, RAPID ANTIGEN: Group A Strep, Rapid Antigen: NEGATIVE

## 2018-12-19 MED ORDER — ALBUTEROL SULFATE HFA 108 (90 BASE) MCG/ACT IN AERS
2.0000 | INHALATION_SPRAY | RESPIRATORY_TRACT | 0 refills | Status: DC | PRN
Start: 2018-12-19 — End: 2019-01-06

## 2018-12-19 MED ORDER — HYDROCOD POLST-CPM POLST ER 10-8 MG/5ML PO SUER
5.00 mL | Freq: Two times a day (BID) | ORAL | 0 refills | Status: DC | PRN
Start: 2018-12-19 — End: 2019-07-20

## 2018-12-19 MED ORDER — PREDNISONE 10 MG PO TABS
10.0000 mg | ORAL_TABLET | Freq: Every day | ORAL | 0 refills | Status: DC
Start: 2018-12-19 — End: 2019-01-06

## 2018-12-19 MED ORDER — ALBUTEROL SULFATE (2.5 MG/3ML) 0.083% IN NEBU
2.50 mg | INHALATION_SOLUTION | Freq: Once | RESPIRATORY_TRACT | Status: AC
Start: 2018-12-19 — End: 2018-12-19
  Administered 2018-12-19: 14:00:00 2.5 mg via RESPIRATORY_TRACT
  Filled 2018-12-19: qty 3

## 2018-12-19 MED ORDER — ALBUTEROL SULFATE HFA 108 (90 BASE) MCG/ACT IN AERS
2.00 | INHALATION_SPRAY | RESPIRATORY_TRACT | Status: DC
Start: 2018-12-19 — End: 2018-12-19
  Administered 2018-12-19: 14:00:00 2 via RESPIRATORY_TRACT
  Filled 2018-12-19: qty 1

## 2018-12-19 MED ORDER — AZITHROMYCIN 250 MG PO TABS
250.00 mg | ORAL_TABLET | Freq: Every day | ORAL | 0 refills | Status: AC
Start: 2018-12-19 — End: 2018-12-24

## 2018-12-19 MED ORDER — PREDNISONE 20 MG PO TABS
60.00 mg | ORAL_TABLET | Freq: Once | ORAL | Status: AC
Start: 2018-12-19 — End: 2018-12-19
  Administered 2018-12-19: 14:00:00 60 mg via ORAL
  Filled 2018-12-19: qty 3

## 2018-12-19 MED ORDER — IPRATROPIUM BROMIDE 0.02 % IN SOLN
0.50 mg | Freq: Once | RESPIRATORY_TRACT | Status: AC
Start: 2018-12-19 — End: 2018-12-19
  Administered 2018-12-19: 14:00:00 0.5 mg via RESPIRATORY_TRACT
  Filled 2018-12-19: qty 2.5

## 2018-12-19 NOTE — ED Provider Notes (Signed)
EMERGENCY DEPARTMENT HISTORY AND PHYSICAL        Physician/Midlevel provider first contact with patient: 12/19/18 1325       Date: 12/19/2018  Patient Name: Yvette Anthony  Chief Complaint:   Chief Complaint   Patient presents with    Cough       History of Presenting Illness           History Provided By: the patient.  Translation services  were not used.  Sign language services were not used.    HPI    Chief Complaint: Cough  Onset: Two days ago  Timing: Persistent  Location: Respiratory  Quality: Productive- yellow sputum  Severity: Moderate  Modifying Factors: None  Associated Symptoms:  Sore throat, chest pain when coughing, low grade fever     Pt presents to the ED with a productive cough (yellow sputum) x two days ago with associated sore throat. She says that sxs are consistent with past bronchitis episodes. Pt also notes having chest pain, but only when coughing. She also says she has a low grade fever. Pt received this season's flu vaccine. Denies dysuria, urgency, frequency, nausea, or vomiting.     PCP: Rudene Anda, MD    LMP: No LMP recorded. Patient is postmenopausal.    GYN HX:   OB History   No obstetric history on file.       Blood Type:         Past History     Past Medical History:  Past Medical History:   Diagnosis Date    Breast cancer 2000    Left-no chemo//no  radiation     Breast cancer 2005    right- +chemo LD 2005// + radiation LD 2006    Inguinal hernia, right     Parkinsons     Post-operative nausea and vomiting     Psoriatic arthritis 2016    Pulmonary embolism 2000    post-op mastectomy           Past Surgical History:  Past Surgical History:   Procedure Laterality Date    BREAST LUMPECTOMY Right 2005    BREAST RECONSTRUCTION Left 2001    HERNIORRHAPHY, INGUINAL Right 08/13/2017    Procedure: HERNIORRHAPHY, INGUINAL;  Surgeon: Verneda Skill, MD;  Location: [facility];  Service: General;  Laterality: Right;  RIGHT INGUINAL HERNIA OPEN    MASTECTOMY Left  2000    MYOMECTOMY  1985         Family History:  History reviewed. No pertinent family history.        Social History:  Social History     Tobacco Use    Smoking status: Former Smoker     Years: 10.00     Types: Cigarettes     Last attempt to quit: 10/17/1997     Years since quitting: 21.1    Smokeless tobacco: Never Used   Substance Use Topics    Alcohol use: Yes     Comment: <1/week    Drug use: No         Allergies:  Allergies   Allergen Reactions    Ciprofloxacin Nausea Only       Current Facility-Administered Medications   Medication Dose Route Frequency Provider Last Rate Last Dose    albuterol (PROVENTIL HFA;VENTOLIN HFA) inhaler 2 puff  2 puff Inhalation Q4H WA Harless Litten, MD   2 puff at 12/19/18 1407     Current Outpatient Medications   Medication Sig  Dispense Refill    acetaminophen-codeine (TYLENOL #3) 300-30 MG per tablet Take 1 tablet by mouth every 6 (six) hours as needed for Pain (or cough).for up to 12 doses 12 tablet 0    benzonatate (TESSALON) 100 MG capsule Take 1 capsule (100 mg total) by mouth 3 (three) times daily as needed for Cough 15 capsule 0    calcium citrate-vitamin D (CITRACAL+D) 315-200 MG-UNIT per tablet Take 1 tablet by mouth 2 (two) times daily.      carbidopa-levodopa (SINEMET) 25-100 MG per tablet Take 1 tablet by mouth 3 (three) times daily 270 tablet 3    Cholecalciferol (VITAMIN D3) 1.25 MG (50000 UT) Cap Take 1 tablet by mouth once a week  1    clonazePAM (KLONOPIN) 0.5 MG tablet Take 0.5 tablets (0.25 mg total) by mouth nightly. (Patient taking differently: Take 0.25 mg by mouth nightly as needed    ) 30 tablet 5    folic acid (FOLVITE) 1 MG tablet Take 1 mg by mouth daily.      methotrexate 2.5 MG tablet Take 17.5 mg by mouth Q Sunday.          Omega-3 Fatty Acids (OMEGA-3 FISH OIL) 500 MG Cap Take by mouth daily.          omeprazole (PRILOSEC) 40 MG capsule Take 40 mg by mouth 2 (two) times daily.      Probiotic Product (PROBIOTIC PO) Take by mouth  daily.      Rasagiline Mesylate (AZILECT) 1 MG Tab Take 1 tablet (1 mg total) by mouth daily 90 tablet 3    rotigotine (NEUPRO) 6 MG/24HR patch Place 1 patch onto the skin daily 90 patch 3    topiramate (TOPAMAX) 50 MG tablet Take 50 mg by mouth 2 (two) times daily.      albuterol (PROVENTIL HFA;VENTOLIN HFA) 108 (90 Base) MCG/ACT inhaler Inhale 2 puffs into the lungs every 4 (four) hours as needed for Wheezing or Shortness of Breath (coughing) Dispense with spacer 1 Inhaler 0    azithromycin (ZITHROMAX Z-PAK) 250 MG tablet Take 1 tablet (250 mg total) by mouth daily for 5 days Take 2 tablets on the first day and then 1 tablet daily for the next 4 days. 6 tablet 0    hydrocodone-chlorpheniramine (TUSSIONEX) 10-8 MG/5ML suspension Take 5 mLs by mouth every 12 (twelve) hours as needed (cough) 120 mL 0    predniSONE (DELTASONE) 10 MG tablet Take 1 tablet (10 mg total) by mouth daily Begin at 6 tabs on day 1, then 5 tabs on day 2 and decrease by 1 tab per day until gone. 21 tablet 0       Review of Systems     Review of Systems   Constitutional: Positive for fever. Negative for activity change, appetite change, diaphoresis and fatigue.   HENT: Positive for sore throat. Negative for ear pain and rhinorrhea.    Eyes: Negative for pain, discharge, redness and visual disturbance.   Respiratory: Positive for cough. Negative for apnea, chest tightness, shortness of breath, wheezing and stridor.    Cardiovascular: Positive for chest pain. Negative for palpitations and leg swelling.   Gastrointestinal: Negative for abdominal distention, abdominal pain, blood in stool, diarrhea, nausea and vomiting.   Genitourinary: Negative for dysuria, frequency and urgency.   Musculoskeletal: Negative for arthralgias, myalgias, neck pain and neck stiffness.   Skin: Negative for color change, pallor and rash.   Neurological: Negative for dizziness, seizures, syncope, speech difficulty, weakness, numbness and  headaches.   Hematological:  Does not bruise/bleed easily.       Physical Exam     Medication List Reviewed: Yes      BP 143/67    Pulse 82    Temp 99.8 F (37.7 C) (Oral)    Resp 18    SpO2 96%     Pulse Oximetry: 96%  (RA unless specified)    Physical Exam  Vitals signs and nursing note reviewed.   Constitutional:       General: She is not in acute distress.     Appearance: She is well-developed. She is not diaphoretic.   HENT:      Head: Normocephalic and atraumatic.      Right Ear: External ear normal.      Left Ear: External ear normal.      Mouth/Throat:      Pharynx: Posterior oropharyngeal erythema (Mild) present. No oropharyngeal exudate.      Comments: No edema or asymmetry  Eyes:      Conjunctiva/sclera: Conjunctivae normal.      Pupils: Pupils are equal, round, and reactive to light.   Neck:      Musculoskeletal: Normal range of motion and neck supple.      Vascular: No JVD.   Cardiovascular:      Rate and Rhythm: Normal rate and regular rhythm.      Heart sounds: Normal heart sounds. No murmur.   Pulmonary:      Effort: No respiratory distress.      Breath sounds: No stridor. Wheezing (Scattered) and rales (L upper lobe) present.      Comments: Normal work of breathing. Breath sounds diminished b/l  Chest:      Chest wall: No tenderness.   Abdominal:      General: There is no distension.      Palpations: Abdomen is soft.      Tenderness: There is no abdominal tenderness. There is no guarding or rebound.   Musculoskeletal: Normal range of motion.   Skin:     General: Skin is warm and dry.      Coloration: Skin is not pale.      Findings: No erythema or rash.   Neurological:      Mental Status: She is alert and oriented to person, place, and time.      Cranial Nerves: No cranial nerve deficit.   Psychiatric:         Behavior: Behavior normal.         Thought Content: Thought content normal.         Judgment: Judgment normal.         Diagnostic Study Results     Labs -     Results     Procedure Component Value Units Date/Time     Rapid Strep (Group A Antigen) [[id number]] Collected:  12/19/18 1340    Specimen:  Throat Updated:  12/19/18 1353     Group A Strep, Rapid Antigen Negative              Radiologic Studies -   Radiology Results (24 Hour)     Procedure Component Value Units Date/Time    XR Chest 2 Views [[id number]] Collected:  12/19/18 1348    Order Status:  Completed Updated:  12/19/18 1353    Narrative:       INDICATION: Productive cough and fever, rales in the left upper lobe    COMPARISON: 10/26/2018    FINDINGS: PA and lateral views  of the chest were obtained.   The heart is normal in size.   The  mediastinum and hilar structures are unremarkable.   The lung fields are clear.   There is no pleural effusion.  No pneumothorax.  The visualized osseous structures demonstrate no acute abnormality.       Impression:        No active disease    Kinnie Feil, MD   12/19/2018 1:49 PM          EKG/MONITOR    Current EKG: not performed    Old EKG:   not reviewed or none available    Cardiac Monitor: not monitored      .      Medical Decision Making   I am the first provider for this patient.    I reviewed the vital signs, available nursing notes, past medical history, past surgical history, family history and social history.      Records Review: None       ED Course:     1:30 PM - Pt agreeable to ED plan, including having imaging and breathing treatment performed.     2:00 PM - Updated pt on results. Breathing treatment still in progress and pt is feeling improved.     3:05 PM - Pt feels improved after breathing treatment. Counseled pt on diagnosis, f/u plans, medication use, and signs and symptoms when to return to ED.  Pt is stable and ready for discharge.         Provider Notes:  I believe that this patient is at low risk for overdose or abuse.  I have cautioned the patient about the combination of medications and written a prescription for Narcan as required by the [facility] of [facility]. I have also counseled the patient about the  addicitive nature of these drugs and the possibility of unintentional diversion.  I believe these medications are the best way to address this patient's complaints. I have also discussed the importance of tapering off the medication.      Procedures: none      Core Measures:        Critical Care Time: none          Diagnosis     Clinical Impression:  Primary diagnosis is in boldface  1. Productive cough    2. Bronchospasm        DISPOSITION    discharged home     DISPOSITION CONDITION    improved    Vital Signs-.     Patient Vitals for the past 12 hrs:   BP Temp Pulse Resp   12/19/18 1326 143/67 99.8 F (37.7 C) 82 18             _______________________________    Attestations:    This note is prepared by Marcille Blanco acting as Scribe for Gaylord Shih, MD.    Gaylord Shih, MD.  The scribe's documentation has been prepared under my direction and personally reviewed by me in its entirety.  I confirm that the note above accurately reflects all work, treatment, procedures, and medical decision making performed by me.      _______________________________      CHART RECONCILIATION: CHART OWNERSHIP: Dr. Gaylord Shih is the primary emergency physician of record.     Harless Litten, MD  12/19/18 1520

## 2018-12-19 NOTE — Discharge Instructions (Signed)
Please be aware that an emergency department diagnosis is often preliminary, and that often a definitive diagnosis cannot be made.  EVERY disease has a progression and may take time before it is recognizable.  It is EXTREMELY important that you return to the Emergency Department or see your own doctor if you do not improve or especially if your symptoms worsen or change. This is particularly true for those conditions which not uncommonly cannot be diagnosed with certainty in the ER and are potentially dangerous, such as chest pain, abdominal pain, headache and pediatric fever.    In short, DO NOT HESITATE TO RETURN TO THE EMERGENCY DEPARTMENT IF YOU SENSE THAT SOMETHING IS NOT RIGHT!      Cough    You have been seen for your cough.    There are many possible causes of cough. Most are not dangerous. Your doctor has determined that it is OK for you to go home today.    Your doctor believes your cough was caused by bacteria. Your doctor prescribed an antibiotic that will fight the bacteria.    The doctor may have prescribed some medicine to help with your cough. Use the medicine as directed.    YOU SHOULD SEEK MEDICAL ATTENTION IMMEDIATELY, EITHER HERE OR AT THE NEAREST EMERGENCY DEPARTMENT, IF ANY OF THE FOLLOWING OCCURS:   You wheeze or have trouble breathing.   You cough up mucous or lose weight for no reason.   You have a fever (temperature higher than 100.66F / 38C) that lasts more than 5 days.   You have chest pain.   Your symptoms get worse or do not get better in 2 or 3 days.   You have any new problems or concerns.        You need to be aware that these are very addictive medications and prone to diversion. Additionally, you should taper the medications and try to take them for no longer than 3 days. You should switch to Motrin or Tylenol instead. You may have been given a prescription for Narcan as required by the [facility] of Medicine.

## 2018-12-19 NOTE — ED Triage Notes (Signed)
Pt c/o productive cough at home x 2 days with congestion and low grade fever.

## 2019-01-06 ENCOUNTER — Encounter (INDEPENDENT_AMBULATORY_CARE_PROVIDER_SITE_OTHER): Payer: Self-pay | Admitting: Neurology

## 2019-01-06 ENCOUNTER — Ambulatory Visit (INDEPENDENT_AMBULATORY_CARE_PROVIDER_SITE_OTHER): Payer: Medicare Other | Admitting: Neurology

## 2019-01-06 VITALS — BP 135/86 | HR 77 | Ht 69.5 in | Wt 179.0 lb

## 2019-01-06 DIAGNOSIS — G4700 Insomnia, unspecified: Secondary | ICD-10-CM

## 2019-01-06 DIAGNOSIS — G20 Parkinson's disease: Secondary | ICD-10-CM

## 2019-01-06 DIAGNOSIS — R4189 Other symptoms and signs involving cognitive functions and awareness: Secondary | ICD-10-CM

## 2019-01-06 DIAGNOSIS — G4752 REM sleep behavior disorder: Secondary | ICD-10-CM

## 2019-01-06 DIAGNOSIS — R449 Unspecified symptoms and signs involving general sensations and perceptions: Secondary | ICD-10-CM

## 2019-01-06 MED ORDER — CARBIDOPA-LEVODOPA 25-100 MG PO TABS
1.00 | ORAL_TABLET | Freq: Four times a day (QID) | ORAL | 3 refills | Status: AC
Start: 2019-01-06 — End: 2020-01-06

## 2019-01-06 MED ORDER — CLONAZEPAM 0.5 MG PO TABS
0.5000 mg | ORAL_TABLET | Freq: Every evening | ORAL | 5 refills | Status: DC
Start: 2019-01-06 — End: 2019-07-20

## 2019-01-06 NOTE — Progress Notes (Signed)
[email or web address]    Subjective:      CC: Tremor    68 y.o.  F presenting to neurology clinic for f/u of tremors , and probable Parkinson's disease.  She has had some illness off and on over the last few months.  At least 2 episodes of bronchitis.  For example, on Christmas Day she was very ill, and she still has a bit of a cough from time to time now.  She did notice that her tremor is much worse when she was feeling ill.  Otherwise, there are many times she does not notice the tremors much, especially if she is generally feeling good.    She is currently taking the Sinemet 1 tab 3 times daily, usually in the morning, usually in the evening with dinner, and at bedtime.  He does seem to be helping, she is not aware if he is having any significant wearing off between doses even giving the space between them.  Still also doing the Azilect and the Neupro patch, with no problems or side effects noted.  No dyskinesias.      No definite wearing off between doses of medication  She denies any side effects to Sinemet, Azilect or Neupro.   No nausea, drowsiness, dizziness, sleep attacks, impulsivity, compulsivity, or leg edema.     Walking well, no falls.   Constipation is a bit better.  Sleep is deftly been better with the clonazepam since she has been taking it she is not having any RBD.    With the tingling in her toes, it is not necessarily gotten any worse.  Occasional having zings of discomfort in her foot as well, but none coming up the leg.  No other leg weakness or numbness.  No loss of bowel or bladder control.      Patient denies smoking, drinks occasionally.  She has not noticed that alcohol makes any change to her tremors.  No drug use.  Her father had Parkinson's disease and is deceased. No other family history of tremors.        Retained from initial Consultation:   Patient states that she has had tremors in her hands for years, but over the last year or so they have gotten much worse.  She  feels like the tremors occur all the time in her hands and legs, and when she is lying down at night.  She feels like her whole body is shaking.  She was previously diagnosed with essential tremor.  She was tried on propranolol, but it did not seem to help at all.  She was tried on primidone, but even a low dose made her feel sick.  She is taking topiramate for weight loss, but it does not seem to help her tremor.  She has some stiffness in her right shoulder, and she thinks this is from her psoriatic arthritis.  No other notable stiffness in arms and legs.  No change in gait, no significant instability.  No falls.  She does have long-standing constipation, and works with a gastroenterologist.  Occasional neck pain and back pain, but no shooting pain down arms or legs.  She does have occasional numbness in her left leg, simply in the left foot.  Denies any weakness in her arms and legs.    Denies anosmia.  No RBD.  She does often have trouble sleeping and wakes up overnight many nights to go to the restroom.  Denies any memory loss.  Denies hallucinations.  The following portions of the patient's history were reviewed and updated as appropriate: allergies, current medications, past family history, past medical history, past social history, past surgical history and problem list.    Review of Systems  Per HPI.  No recent illness.  Denies fever, chills, cough, sinus pain, eye pain, eye redness, ear pain, rhinorrhea, sore throat, chest pain, SOB, wheezing, abd pain, Nausea, Vomiting, diarrhea,  dysuria, or rashes.  All other ROS negative.       Objective:     Vitals:    01/06/19 0927   BP: 135/86   Pulse: 77       Constitutional: NAD   Eyes: Clear conjunctiva  Neck: Nontender, full ROM, No Lhermitte's sign  Cardiovascular: RRR  Resipratory: CTA B   Musculoskeletal: Normal muscle bulk.  No muscle pain  Integumentary: No abnormal rash noted  Psych: Normal affect    Neurological:   Mental Status: Alert & oriented  to person, place, month, & year.   Language: Spontaneous & fluent speech, good comprehension.    Cranial Nerves:  II, III: PERRL, VFF  III, IV, VI: EOMI, No nystagmus, no palsies, no ptosis  V: Intact to LT V1-V3 distribution bilaterally.   VI: Symmetrical face and expression.   VIII: Hearing intact to finger rub bilaterally.   IX, X: Palate/Uvula elevates symmetrically.   XI: 5/5 Trapezius & SCM bilaterally.   XII: Tongue is midline.     Motor Exam: Normal bulk and tone. No clonus. No pronator drift. Strength 5/5 throughout and symmetric., mild resting tremor in right hand only.  Postural tremor bilaterally, slightly worse on right.  Normal fine motor movements, no definite increase in tone and no definite cogwheeling.    Sensation: Sensation to LT intact grossly throughout symmetrically. Romberg negative    Cerebellar:   Finger to Nose: Mild postural intention tremor bilaterally.  No dysmetria.    Reflexes: 2+ throughout, symmetric, with down-going toes.    Station/ Gait: Normal with an stride length.  Decreased arm swing with some re-emergent tremor on right.    UPDRS-12    Assessment:     Patient with long-standing tremors and possible restless leg syndrome.  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  She was started on Neupro, and is tolerating it well.   Also now on Azilect to 1 mg and Sinemet 25/100, 1 tab 3 times daily.  This seems to be providing good benefit, and her main symptom is now on intermittent tremor in her right hand only.  Being said, she is taking the Sinemet a bit too far apart, so we will just dose to 1 tab 4 times daily, and may want to change to a longer acting regimen in the future.    Tremors and RLS are somewhat better, and with no side effects to current regimen and now on Neupro to 6 mg.  Her tremors are worsened if she is stressed, and are reprotedly worse over the past week.     Patient has not had to EMG/NCS, 1 since the last appointment, that were normal with no  evidence of neuropathy or radiculopathy.  She may simply have some small fiber neuropathy or nerve damage in her left foot that is not detectable by this means.  Plan:     1.  Parkinson's diseasechronic, severe, progressively worsening condition  incr Sinemet 1 tab 4 times daily  Cont Neupro to 6 mg daily.  Cont Azilect   1 mg daily.  Discussed possible  risks, side effects and interactions.    2.  Constipation  Provided recipe for constipation cocktail.   Better hydration and could consider MiraLAX if needed    3.  Insomnia  Continue low dose clonazepam for sleep trouble     4.  RBD  Doing well on current dose of clonazepam              RTC in 2-3 months. Patient can follow up sooner if needed. In the meantime, patient will contact the office with any questions or concerns.     -----------------------------------------------------------    Additional notes and data scanned including patient questionnaire which may contain pertinent information to visit.         Kateri Plummer, MD, PhD  Co-Director  Movement Disorders Specialist  [facility]  [facility]    [address]  [address]  [place] [address]    [place], [place] [address]  T [phone]   F [phone]   T [phone]   F [phone]     [email or web address]

## 2019-03-28 ENCOUNTER — Telehealth (INDEPENDENT_AMBULATORY_CARE_PROVIDER_SITE_OTHER): Payer: Medicare Other | Admitting: Neurology

## 2019-03-28 ENCOUNTER — Encounter (INDEPENDENT_AMBULATORY_CARE_PROVIDER_SITE_OTHER): Payer: Self-pay | Admitting: Neurology

## 2019-03-28 DIAGNOSIS — R200 Anesthesia of skin: Secondary | ICD-10-CM

## 2019-03-28 DIAGNOSIS — R449 Unspecified symptoms and signs involving general sensations and perceptions: Secondary | ICD-10-CM

## 2019-03-28 DIAGNOSIS — R251 Tremor, unspecified: Secondary | ICD-10-CM

## 2019-03-28 DIAGNOSIS — R208 Other disturbances of skin sensation: Secondary | ICD-10-CM

## 2019-03-28 DIAGNOSIS — G4752 REM sleep behavior disorder: Secondary | ICD-10-CM

## 2019-03-28 DIAGNOSIS — G20 Parkinson's disease: Secondary | ICD-10-CM

## 2019-03-28 DIAGNOSIS — G4700 Insomnia, unspecified: Secondary | ICD-10-CM

## 2019-03-28 DIAGNOSIS — R4189 Other symptoms and signs involving cognitive functions and awareness: Secondary | ICD-10-CM

## 2019-03-28 NOTE — Progress Notes (Signed)
[email or web address]    Telemedicine Video visit    Verbal consent has been obtained from the patient to conduct a video and telephone visit to minimize exposure to COVID-19: yes      Subjective:      CC: Tremor and PD    68 y.o.  F presenting to neurology clinic for f/u of tremors , and probable Parkinson's disease.  She reports she is doing fairly well.  She does feel that her tremor has been a bit worse recently.,  She does admit is a lot more life stress around coronavirus and isolation.  In particular she is worried about her mobile and see in a nursing home.  She is now taking the carbidopa levodopa 1 tab 4 times daily.  She is taking this in addition to the Neupro and Azilect.  Denies any side effects or problems, but she did not necessarily notice any improvement with the addition of that fourth tablet of levodopa.  She feels that her tremor still fluctuate depending on how she is feeling, so she does not want to increase medications right now I would like to see how the tremor settles down after some of the current stress improves.        No definite wearing off between doses of medication  She denies any side effects to Sinemet, Azilect or Neupro.   No nausea, drowsiness, dizziness, sleep attacks, impulsivity, compulsivity, or leg edema.     Walking well, no falls.   Constipation is a bit better.  Sleep has been better with the clonazepam since she has been taking it she is not having any RBD.    She is try to get exercise consistently, and does a treadmill multiple times per week.      Patient denies smoking, drinks occasionally.  She has not noticed that alcohol makes any change to her tremors.  No drug use.  Her father had Parkinson's disease and is deceased. No other family history of tremors.        Retained from initial Consultation:   Patient states that she has had tremors in her hands for years, but over the last year or so they have gotten much worse.  She feels like the  tremors occur all the time in her hands and legs, and when she is lying down at night.  She feels like her whole body is shaking.  She was previously diagnosed with essential tremor.  She was tried on propranolol, but it did not seem to help at all.  She was tried on primidone, but even a low dose made her feel sick.  She is taking topiramate for weight loss, but it does not seem to help her tremor.  She has some stiffness in her right shoulder, and she thinks this is from her psoriatic arthritis.  No other notable stiffness in arms and legs.  No change in gait, no significant instability.  No falls.  She does have long-standing constipation, and works with a gastroenterologist.  Occasional neck pain and back pain, but no shooting pain down arms or legs.  She does have occasional numbness in her left leg, simply in the left foot.  Denies any weakness in her arms and legs.    Denies anosmia.  No RBD.  She does often have trouble sleeping and wakes up overnight many nights to go to the restroom.  Denies any memory loss.  Denies hallucinations.         The following portions of the  patient's history were reviewed and updated as appropriate: allergies, current medications, past family history, past medical history, past social history, past surgical history and problem list.    Review of Systems  Per HPI.  No recent illness.  Denies fever, chills, cough, sinus pain, eye pain, eye redness, ear pain, rhinorrhea, sore throat, chest pain, SOB, wheezing, abd pain, Nausea, Vomiting, diarrhea,  dysuria, or rashes.  All other ROS negative.       Objective:     Limited Physical exam    Constitutional: NAD   Psych: Normal affect    Neurological:   Mental Status: Alert & oriented to person, place, month, & year.   Language: Spontaneous & fluent speech, good comprehension.    Cranial Nerves:  II, III: PERRL, VFF  III, IV, VI: EOMI, No nystagmus, no palsies, no ptosis  V: Intact to LT V1-V3 distribution bilaterally.   VI:  Symmetrical face and expression.   VIII: Hearing intact    Motor Exam: MOves all ext antigravity.  mild resting tremor in right hand only.  Postural tremor bilaterally, slightly worse on right.  Normal fine motor movements    Sensation: Sensation noted to be intact throughout    Cerebellar:   Finger to Nose: Mild postural intention tremor bilaterally.  No dysmetria.      Assessment:     Patient with long-standing tremors and possible restless leg syndrome.  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  She was started on Neupro, and is tolerating it well.   Also now on Azilect to 1 mg and Sinemet 25/100, 1 tab 4 times daily.  This seems to be providing good benefit, and her main symptom is now on intermittent tremor in her right hand only.      Tremors and RLS are somewhat better, and with no side effects to current regimen and now on Neupro to 6 mg.  Her tremors are worsened if she is stressed, and are reprotedly worse over the past week.       Plan:     1.  Parkinson's diseasechronic, severe, progressively worsening condition   Sinemet 1 tab 4 times daily  Cont Neupro to 6 mg daily.  Cont Azilect   1 mg daily.  Discussed possible risks, side effects and interactions.    2.  Constipation  Provided recipe for constipation cocktail.   Better hydration and could consider MiraLAX if needed    3.  Insomnia  Continue low dose clonazepam for sleep trouble     4.  RBD  Doing well on current dose of clonazepam              RTC in 2-3 months. Patient can follow up sooner if needed. In the meantime, patient will contact the office with any questions or concerns.     -----------------------------------------------------------    Additional notes and data scanned including patient questionnaire which may contain pertinent information to visit.         Kateri Plummer, MD, PhD  Co-Director  Movement Disorders Specialist  [facility]  [facility]    [address]   [address]  [name] [address]    [place], [place] [address]  T [phone]   F [phone]   T [phone]   F [phone]     [email or web address]

## 2019-06-14 ENCOUNTER — Telehealth (INDEPENDENT_AMBULATORY_CARE_PROVIDER_SITE_OTHER): Payer: Medicare Other | Admitting: Neurology

## 2019-06-14 ENCOUNTER — Encounter (INDEPENDENT_AMBULATORY_CARE_PROVIDER_SITE_OTHER): Payer: Self-pay | Admitting: Neurology

## 2019-06-14 VITALS — Ht 69.5 in | Wt 170.0 lb

## 2019-06-14 DIAGNOSIS — G20 Parkinson's disease: Secondary | ICD-10-CM

## 2019-06-14 MED ORDER — NEUPRO 8 MG/24HR TD PT24
1.00 | MEDICATED_PATCH | Freq: Every day | TRANSDERMAL | 3 refills | Status: DC
Start: 2019-06-14 — End: 2019-07-06

## 2019-06-16 ENCOUNTER — Telehealth (INDEPENDENT_AMBULATORY_CARE_PROVIDER_SITE_OTHER): Payer: Medicare Other | Admitting: Neurology

## 2019-06-21 ENCOUNTER — Encounter (INDEPENDENT_AMBULATORY_CARE_PROVIDER_SITE_OTHER): Payer: Self-pay | Admitting: Neurology

## 2019-06-21 NOTE — Progress Notes (Signed)
[email or web address]    Telemedicine Video visit    Verbal consent has been obtained from the patient to conduct a video and telephone visit to minimize exposure to COVID-19: yes      Subjective:      CC: Tremor and PD    68 y.o.  F presenting to neurology clinic for f/u of tremors , and probable Parkinson's disease.  She has noticed that her tremor seems to be a bit worse over the last few months.  She is still taking the Neupro 6 mg patch, Azilect 1 mg daily, and Sinemet 1 tab 4 times daily.  While those medications do help, she does feel that her tremor is been a bit worse even with them.  No definite wearing off between doses of medicine, denies any side effects or problems with medications.  No dyskinesias.   No nausea, drowsiness, dizziness, sleep attacks, impulsivity, compulsivity, or leg edema.     Walking well, no falls.   Constipation is a bit better.  Sleep has been better with the clonazepam since she has been taking it,  with no further RBD.Marland Kitchen    She is try to get exercise consistently, and does a treadmill multiple times per week.      Patient denies smoking, drinks occasionally.  She has not noticed that alcohol makes any change to her tremors.  No drug use.  Her father had Parkinson's disease and is deceased. No other family history of tremors.        Retained from initial Consultation:   Patient states that she has had tremors in her hands for years, but over the last year or so they have gotten much worse.  She feels like the tremors occur all the time in her hands and legs, and when she is lying down at night.  She feels like her whole body is shaking.  She was previously diagnosed with essential tremor.  She was tried on propranolol, but it did not seem to help at all.  She was tried on primidone, but even a low dose made her feel sick.  She is taking topiramate for weight loss, but it does not seem to help her tremor.  She has some stiffness in her right shoulder, and she  thinks this is from her psoriatic arthritis.  No other notable stiffness in arms and legs.  No change in gait, no significant instability.  No falls.  She does have long-standing constipation, and works with a gastroenterologist.  Occasional neck pain and back pain, but no shooting pain down arms or legs.  She does have occasional numbness in her left leg, simply in the left foot.  Denies any weakness in her arms and legs.    Denies anosmia.  No RBD.  She does often have trouble sleeping and wakes up overnight many nights to go to the restroom.  Denies any memory loss.  Denies hallucinations.         The following portions of the patient's history were reviewed and updated as appropriate: allergies, current medications, past family history, past medical history, past social history, past surgical history and problem list.    Review of Systems  Per HPI.  No recent illness.  Denies fever, chills, cough, sinus pain, eye pain, eye redness, ear pain, rhinorrhea, sore throat, chest pain, SOB, wheezing, abd pain, Nausea, Vomiting, diarrhea,  dysuria, or rashes.  All other ROS negative.       Objective:     Limited Physical exam  Constitutional: NAD   Psych: Normal affect    Neurological:   Mental Status: Alert & oriented to person, place, month, & year.   Language: Spontaneous & fluent speech, good comprehension.    Cranial Nerves:  II, III: PERRL, VFF  III, IV, VI: EOMI, No nystagmus, no palsies, no ptosis  V: Intact to LT V1-V3 distribution bilaterally.   VI: Symmetrical face and expression.   VIII: Hearing intact    Motor Exam: MOves all ext antigravity.  mild resting tremor in right hand only.  Postural tremor bilaterally, slightly worse on right.  Normal fine motor movements    Sensation: Sensation noted to be intact throughout    Cerebellar:   Finger to Nose: Mild postural intention tremor bilaterally.  No dysmetria.      Assessment:     Patient with long-standing tremors and possible restless leg syndrome.  DAT  scan was positive for reduced dopamanergic activity.  She was started on Neupro, and is tolerating it well.   Also now on Azilect to 1 mg and Sinemet 25/100, 1 tab 4 times daily.  This seems to be providing good benefit, and her main symptom is now on intermittent tremor in her right hand only, although this does seem to be worsening somewhat recently.  Therefore, we will increase the Neupro patch.          Plan:     1.  Parkinson's diseasechronic, severe, progressively worsening condition   Sinemet 1 tab 4 times daily  Increase Neupro to 8 mg daily.  Cont Azilect   1 mg daily.  Discussed possible risks, side effects and interactions.    2.  Constipation  Provided recipe for constipation cocktail.   Better hydration and could consider MiraLAX if needed    3.  Insomnia  Continue low dose clonazepam for sleep trouble     4.  RBD  Doing well on current dose of clonazepam              RTC in 2-3 months. Patient can follow up sooner if needed. In the meantime, patient will contact the office with any questions or concerns.     -----------------------------------------------------------    Additional notes and data scanned including patient questionnaire which may contain pertinent information to visit.         Kateri Plummer, MD, PhD  Co-Director  Movement Disorders Specialist  [facility]  [facility]    [address]  [address]  [place] [address]    [place], [place] [address]  T [phone]   F [phone]   T [phone]   F [phone]     [email or web address]

## 2019-07-06 ENCOUNTER — Other Ambulatory Visit (INDEPENDENT_AMBULATORY_CARE_PROVIDER_SITE_OTHER): Payer: Self-pay

## 2019-07-06 DIAGNOSIS — G20 Parkinson's disease: Secondary | ICD-10-CM

## 2019-07-06 MED ORDER — NEUPRO 8 MG/24HR TD PT24
1.00 | MEDICATED_PATCH | Freq: Every day | TRANSDERMAL | 1 refills | Status: DC
Start: 2019-07-06 — End: 2019-11-23

## 2019-07-06 NOTE — Telephone Encounter (Signed)
Request 90 day script

## 2019-07-20 ENCOUNTER — Telehealth (INDEPENDENT_AMBULATORY_CARE_PROVIDER_SITE_OTHER): Payer: Medicare Other | Admitting: Neurology

## 2019-07-20 ENCOUNTER — Encounter (INDEPENDENT_AMBULATORY_CARE_PROVIDER_SITE_OTHER): Payer: Self-pay | Admitting: Neurology

## 2019-07-20 VITALS — Ht 69.0 in | Wt 168.0 lb

## 2019-07-20 DIAGNOSIS — R208 Other disturbances of skin sensation: Secondary | ICD-10-CM

## 2019-07-20 DIAGNOSIS — G4700 Insomnia, unspecified: Secondary | ICD-10-CM

## 2019-07-20 DIAGNOSIS — G20 Parkinson's disease: Secondary | ICD-10-CM

## 2019-07-20 DIAGNOSIS — G4752 REM sleep behavior disorder: Secondary | ICD-10-CM

## 2019-07-20 DIAGNOSIS — R200 Anesthesia of skin: Secondary | ICD-10-CM

## 2019-07-20 MED ORDER — CLONAZEPAM 0.5 MG PO TABS
0.5000 mg | ORAL_TABLET | Freq: Every evening | ORAL | 5 refills | Status: DC
Start: 2019-07-20 — End: 2020-02-29

## 2019-07-26 ENCOUNTER — Encounter (INDEPENDENT_AMBULATORY_CARE_PROVIDER_SITE_OTHER): Payer: Self-pay | Admitting: Neurology

## 2019-07-26 NOTE — Progress Notes (Signed)
[email or web address]    Telemedicine Video visit    Verbal consent has been obtained from the patient to conduct a video and telephone visit to minimize exposure to COVID-19: yes      Subjective:      CC: Tremor and PD    68 y.o.  F presenting to neurology clinic for f/u of tremors , and probable Parkinson's disease.  She think she is generally doing well.  She is now taking the combination of Sinemet, Azilect, and Neupro.  After increasing the Neupro patch she definitely feels like she is moving better and the tremors are better as well.  Denies any side effects or problems with any of the medications.  No definite wearing off between doses.  No dyskinesias.  No rashes, no nausea, drowsiness, dizziness, sleep attacks, impulsivity, compulsivity, or leg edema.     Walking well, no falls.  Occasionally still has some mild constipation.  Sleep has been better with the clonazepam since she has been taking it,  with no further RBD.Marland Kitchen      She is trying to exercise consistently, and most some mild instability and as mentioned above no falls.    Patient denies smoking, drinks occasionally.  She has not noticed that alcohol makes any change to her tremors.  No drug use.  Her father had Parkinson's disease and is deceased. No other family history of tremors.        Retained from initial Consultation:   Patient states that she has had tremors in her hands for years, but over the last year or so they have gotten much worse.  She feels like the tremors occur all the time in her hands and legs, and when she is lying down at night.  She feels like her whole body is shaking.  She was previously diagnosed with essential tremor.  She was tried on propranolol, but it did not seem to help at all.  She was tried on primidone, but even a low dose made her feel sick.  She is taking topiramate for weight loss, but it does not seem to help her tremor.  She has some stiffness in her right shoulder, and she thinks this  is from her psoriatic arthritis.  No other notable stiffness in arms and legs.  No change in gait, no significant instability.  No falls.  She does have long-standing constipation, and works with a gastroenterologist.  Occasional neck pain and back pain, but no shooting pain down arms or legs.  She does have occasional numbness in her left leg, simply in the left foot.  Denies any weakness in her arms and legs.    Denies anosmia.  No RBD.  She does often have trouble sleeping and wakes up overnight many nights to go to the restroom.  Denies any memory loss.  Denies hallucinations.         The following portions of the patient's history were reviewed and updated as appropriate: allergies, current medications, past family history, past medical history, past social history, past surgical history and problem list.    Review of Systems  Per HPI.  No recent illness.  Denies fever, chills, cough, sinus pain, eye pain, eye redness, ear pain, rhinorrhea, sore throat, chest pain, SOB, wheezing, abd pain, Nausea, Vomiting, diarrhea,  dysuria, or rashes.  All other ROS negative.       Objective:     Limited Physical exam    Constitutional: NAD   Psych: Normal affect    Neurological:  Mental Status: Alert & oriented to person, place, month, & year.   Language: Spontaneous & fluent speech, good comprehension.    Cranial Nerves:  II, III: PERRL, VFF  III, IV, VI: EOMI, No nystagmus, no palsies, no ptosis  V: Intact to LT V1-V3 distribution bilaterally.   VI: Symmetrical face and expression.   VIII: Hearing intact    Motor Exam: MOves all ext antigravity.  mild resting tremor in right hand only.  Postural tremor bilaterally, slightly worse on right.  Normal fine motor movements    Sensation: Sensation noted to be intact throughout    Cerebellar:   Finger to Nose: Mild postural intention tremor bilaterally.  No dysmetria.      Assessment:     Patient with long-standing tremors and possible restless leg syndrome.  DAT scan was  positive for reduced dopamanergic activity.  She was started on Neupro, and is tolerating it well.   Also now on Azilect to 1 mg and Sinemet 25/100, 1 tab 4 times daily.  This seems to be providing good benefit, and with the increase in Neupro patch she seems to be having more consistent benefit.          Plan:     1.  Parkinson's diseasechronic, severe, progressively worsening condition   Sinemet 1 tab 4 times daily  Continue the Neupro to 8 mg daily.  Cont Azilect   1 mg daily.  Discussed possible risks, side effects and interactions.    2.  Constipation  Provided recipe for constipation cocktail.   Better hydration and could consider MiraLAX if needed    3.  Insomnia  Continue low dose clonazepam for sleep trouble     4.  RBD  Doing well on current dose of clonazepam              RTC in 2-3 months. Patient can follow up sooner if needed. In the meantime, patient will contact the office with any questions or concerns.     -----------------------------------------------------------    Additional notes and data scanned including patient questionnaire which may contain pertinent information to visit.         Kateri Plummer, MD, PhD  Co-Director  Movement Disorders Specialist  [facility]  [facility]    [address]  [address]  [place] [address]    [place], [place] [address]  T [phone]   F [phone]   T [phone]   F [phone]     [email or web address]

## 2019-11-23 ENCOUNTER — Encounter (INDEPENDENT_AMBULATORY_CARE_PROVIDER_SITE_OTHER): Payer: Self-pay | Admitting: Neurology

## 2019-11-23 ENCOUNTER — Telehealth (INDEPENDENT_AMBULATORY_CARE_PROVIDER_SITE_OTHER): Payer: Medicare Other | Admitting: Neurology

## 2019-11-23 VITALS — Ht 69.0 in | Wt 168.0 lb

## 2019-11-23 DIAGNOSIS — G4752 REM sleep behavior disorder: Secondary | ICD-10-CM

## 2019-11-23 DIAGNOSIS — G20 Parkinson's disease: Secondary | ICD-10-CM

## 2019-11-23 DIAGNOSIS — G4700 Insomnia, unspecified: Secondary | ICD-10-CM

## 2019-11-23 MED ORDER — RASAGILINE MESYLATE 1 MG PO TABS
1.0000 mg | ORAL_TABLET | Freq: Every day | ORAL | 3 refills | Status: DC
Start: 2019-11-23 — End: 2020-03-27

## 2019-11-23 MED ORDER — NEUPRO 8 MG/24HR TD PT24
1.00 | MEDICATED_PATCH | Freq: Every day | TRANSDERMAL | 3 refills | Status: DC
Start: 2019-11-23 — End: 2020-02-29

## 2019-11-23 NOTE — Progress Notes (Signed)
[email or web address]    Telemedicine Video visit    Verbal consent has been obtained from the patient to conduct a video and telephone visit to minimize exposure to COVID-19: yes      Subjective:      CC: Tremor and PD    69 y.o.  F presenting to neurology clinic for f/u of tremors , and probable Parkinson's disease.  She reports that she has not been sleeping as well for the last few weeks.  She does admit there is a lot of stress, with coronavirus, isolation, and problems in the country in general for the last month.  As such she is generally more tired during the day but some nights he did not know how tired she is she cannot fall asleep before 5 in the morning for she finally falls asleep.  She states she does not take clonazepam, unless she feels she absolutely has to.  She never tried any other sleep agents or even melatonin    Pro movement standpoint she reports doing fairly well.  She is a little shakier when she is feeling more anxious or tired, but otherwise think she is doing well with her current medications.  Denies any side effects or problems with her current medications      Walking well, no falls.  Occasionally still has some mild constipation.  Sleep has been better with the clonazepam since she has been taking it,  with no further RBD.Marland Kitchen      She is trying to exercise consistently, and most some mild instability and as mentioned above no falls.    Patient denies smoking, drinks occasionally.  She has not noticed that alcohol makes any change to her tremors.  No drug use.  Her father had Parkinson's disease and is deceased. No other family history of tremors.        Retained from initial Consultation:   Patient states that she has had tremors in her hands for years, but over the last year or so they have gotten much worse.  She feels like the tremors occur all the time in her hands and legs, and when she is lying down at night.  She feels like her whole body is shaking.  She  was previously diagnosed with essential tremor.  She was tried on propranolol, but it did not seem to help at all.  She was tried on primidone, but even a low dose made her feel sick.  She is taking topiramate for weight loss, but it does not seem to help her tremor.  She has some stiffness in her right shoulder, and she thinks this is from her psoriatic arthritis.  No other notable stiffness in arms and legs.  No change in gait, no significant instability.  No falls.  She does have long-standing constipation, and works with a gastroenterologist.  Occasional neck pain and back pain, but no shooting pain down arms or legs.  She does have occasional numbness in her left leg, simply in the left foot.  Denies any weakness in her arms and legs.    Denies anosmia.  No RBD.  She does often have trouble sleeping and wakes up overnight many nights to go to the restroom.  Denies any memory loss.  Denies hallucinations.         The following portions of the patient's history were reviewed and updated as appropriate: allergies, current medications, past family history, past medical history, past social history, past surgical history and problem list.  Review of Systems  Per HPI.  No recent illness.  Denies fever, chills, cough, sinus pain, eye pain, eye redness, ear pain, rhinorrhea, sore throat, chest pain, SOB, wheezing, abd pain, Nausea, Vomiting, diarrhea,  dysuria, or rashes.  All other ROS negative.       Objective:     Limited Physical exam    Constitutional: NAD   Psych: Normal affect    Neurological:   Mental Status: Alert & oriented to person, place, month, & year.   Language: Spontaneous & fluent speech, good comprehension.    Cranial Nerves:  II, III: PERRL, VFF  III, IV, VI: EOMI, No nystagmus, no palsies, no ptosis  V: Intact to LT V1-V3 distribution bilaterally.   VI: Symmetrical face and expression.   VIII: Hearing intact    Motor Exam: MOves all ext antigravity.  mild resting tremor in right hand only.   Postural tremor bilaterally, slightly worse on right.  Normal fine motor movements    Sensation: Sensation noted to be intact throughout    Cerebellar:   Finger to Nose: Mild postural intention tremor bilaterally.  No dysmetria.      Assessment:     Patient with long-standing tremors and possible restless leg syndrome.  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  She was started on Neupro, and is tolerating it well.   Also now on Azilect to 1 mg and Sinemet 25/100, 1 tab 4 times daily.  This seems to be providing good benefit, and with the increase in Neupro patch she seems to be having more consistent benefit.    The difficulty sleeping can certainly be related to stress and anxiety.  I suggest she could try melatonin more consistently and still save the clonazepam for the worst nights also, trying to be more active during the day to hopefully be more tired at night can help.          Plan:     1.  Parkinson's diseasechronic, severe, progressively worsening condition   Sinemet 1 tab 4 times daily  Continue the Neupro to 8 mg daily.  Cont Azilect   1 mg daily.  Discussed possible risks, side effects and interactions.    2.  Constipation  Provided recipe for constipation cocktail.   Better hydration and could consider MiraLAX if needed    3.  Insomnia  Continue low dose clonazepam for sleep trouble as needed  If she does not wish to take this, she could trying to take melatonin nightly instead    4.  RBD  Has not been a problem lately, if it comes back she may take the clonazepam more often              RTC in 2-3 months. Patient can follow up sooner if needed. In the meantime, patient will contact the office with any questions or concerns.     -----------------------------------------------------------    Additional notes and data scanned including patient questionnaire which may contain pertinent information to visit.     Total appointment time was 43 minutes on day of service including reviewing  prior records, patient face encounter with care coordination, and documentation    Kateri Plummer, MD, PhD  Co-Director  Movement Disorders Specialist  [facility]  [facility]    [address]  [address]  [place] [address]    [place], [place] [address]  T [phone]   F [phone]   T [phone]  F [phone]     [email or web address]

## 2020-01-01 ENCOUNTER — Encounter (INDEPENDENT_AMBULATORY_CARE_PROVIDER_SITE_OTHER): Payer: Self-pay

## 2020-01-02 ENCOUNTER — Ambulatory Visit (INDEPENDENT_AMBULATORY_CARE_PROVIDER_SITE_OTHER): Payer: Medicare Other

## 2020-01-02 DIAGNOSIS — Z23 Encounter for immunization: Secondary | ICD-10-CM

## 2020-01-23 ENCOUNTER — Encounter (INDEPENDENT_AMBULATORY_CARE_PROVIDER_SITE_OTHER): Payer: Self-pay

## 2020-01-29 ENCOUNTER — Ambulatory Visit (INDEPENDENT_AMBULATORY_CARE_PROVIDER_SITE_OTHER): Payer: Medicare Other

## 2020-01-29 DIAGNOSIS — Z23 Encounter for immunization: Secondary | ICD-10-CM

## 2020-02-29 ENCOUNTER — Ambulatory Visit (INDEPENDENT_AMBULATORY_CARE_PROVIDER_SITE_OTHER): Payer: Medicare Other | Admitting: Neurology

## 2020-02-29 ENCOUNTER — Encounter (INDEPENDENT_AMBULATORY_CARE_PROVIDER_SITE_OTHER): Payer: Self-pay | Admitting: Neurology

## 2020-02-29 VITALS — Ht 69.5 in | Wt 175.0 lb

## 2020-02-29 DIAGNOSIS — G4700 Insomnia, unspecified: Secondary | ICD-10-CM

## 2020-02-29 DIAGNOSIS — R251 Tremor, unspecified: Secondary | ICD-10-CM

## 2020-02-29 DIAGNOSIS — G4752 REM sleep behavior disorder: Secondary | ICD-10-CM

## 2020-02-29 DIAGNOSIS — G20 Parkinson's disease: Secondary | ICD-10-CM

## 2020-02-29 MED ORDER — CLONAZEPAM 0.5 MG PO TABS
0.5000 mg | ORAL_TABLET | Freq: Every evening | ORAL | 5 refills | Status: DC
Start: 2020-02-29 — End: 2020-05-24

## 2020-02-29 MED ORDER — NEUPRO 8 MG/24HR TD PT24
1.00 | MEDICATED_PATCH | Freq: Every day | TRANSDERMAL | 3 refills | Status: DC
Start: 2020-02-29 — End: 2020-05-24

## 2020-02-29 NOTE — Progress Notes (Signed)
[email or web address]    Subjective:      CC: Tremor    69 y.o.  F presenting to neurology clinic for f/u of tremors , and probable Parkinson's disease.  Overall, she does feel she is generally doing well.  She will have some days the tremor in her right hand can be a bit more pronounced,, then other days like today that she will need to notice it too much.  Today for instance her right hand tremor has been present a few times during the day but generally does not bother her too much.  She does definitely feel that she is doing fine motor tasks with her hands or if she is feeling more stressed or anxious the tremor will come out a lot more.    She is still taking the same dose of the Neupro, Azilect, and Sinemet, and generally speaking it is helping her, she has not noticed any significant wearing off between doses with no obvious side effects or problems with the medicine.        No definite wearing off between doses of medication  She denies any side effects to Sinemet, Azilect or Neupro.   No nausea, drowsiness, dizziness, sleep attacks, impulsivity, compulsivity, or leg edema.     Walking well, no falls.   Constipation is a bit better.  She reports generally sleeping well, no definite RBD.    Patient denies smoking, drinks occasionally.  She has not noticed that alcohol makes any change to her tremors.  No drug use.  Her father had Parkinson's disease and is deceased. No other family history of tremors.        Retained from initial Consultation:   Patient states that she has had tremors in her hands for years, but over the last year or so they have gotten much worse.  She feels like the tremors occur all the time in her hands and legs, and when she is lying down at night.  She feels like her whole body is shaking.  She was previously diagnosed with essential tremor.  She was tried on propranolol, but it did not seem to help at all.  She was tried on primidone, but even a low dose made her feel  sick.  She is taking topiramate for weight loss, but it does not seem to help her tremor.  She has some stiffness in her right shoulder, and she thinks this is from her psoriatic arthritis.  No other notable stiffness in arms and legs.  No change in gait, no significant instability.  No falls.  She does have long-standing constipation, and works with a gastroenterologist.  Occasional neck pain and back pain, but no shooting pain down arms or legs.  She does have occasional numbness in her left leg, simply in the left foot.  Denies any weakness in her arms and legs.    Denies anosmia.  No RBD.  She does often have trouble sleeping and wakes up overnight many nights to go to the restroom.  Denies any memory loss.  Denies hallucinations.         The following portions of the patient's history were reviewed and updated as appropriate: allergies, current medications, past family history, past medical history, past social history, past surgical history and problem list.    Review of Systems  Per HPI.  No recent illness.  Denies fever, chills, cough, sinus pain, eye pain, eye redness, ear pain, rhinorrhea, sore throat, chest pain, SOB, wheezing, abd pain, Nausea, Vomiting,  diarrhea,  dysuria, or rashes.  All other ROS negative.       Objective:       Constitutional: NAD   Eyes: Clear conjunctiva  Neck: Nontender, full ROM, No Lhermitte's sign  Cardiovascular: RRR  Resipratory: CTA B   Musculoskeletal: Normal muscle bulk.  No muscle pain  Integumentary: No abnormal rash noted  Psych: Normal affect    Neurological:   Mental Status: Alert & oriented to person, place, month, & year.   Language: Spontaneous & fluent speech, good comprehension.    Cranial Nerves:  II, III: PERRL, VFF  III, IV, VI: EOMI, No nystagmus, no palsies, no ptosis  V: Intact to LT V1-V3 distribution bilaterally.   VI: Symmetrical face and expression.   VIII: Hearing intact to finger rub bilaterally.   IX, X: Palate/Uvula elevates symmetrically.   XI: 5/5  Trapezius & SCM bilaterally.   XII: Tongue is midline.     Motor Exam: Normal bulk and tone. No clonus. No pronator drift. Strength 5/5 throughout and symmetric., mild resting tremor in right hand only.  Postural tremor bilaterally, slightly worse on right.  Normal fine motor movements, no definite increase in tone and no definite cogwheeling.      Cerebellar:   Finger to Nose: Mild postural intention tremor bilaterally.  No dysmetria.    Station/ Gait: Normal with an stride length.  Decreased arm swing with some re-emergent tremor on right.    UPDRS-15    Assessment:     Patient with long-standing tremors and possible restless leg syndrome.  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  She was started on Neupro, and is tolerating it well.   Also now on Azilect to 1 mg and Sinemet 25/100, 1 tab 4 times daily.  This seems to be providing good benefit, and her main symptom is now on intermittent tremor in her right hand only.      s.  Plan:     1.  Parkinson's diseasechronic, severe, progressively worsening condition  incr Sinemet 1 tab 4 times daily  Cont Neupro 8  mg daily.  Cont Azilect   1 mg daily.  Discussed possible risks, side effects and interactions.    2.  Constipation  Provided recipe for constipation cocktail.   Better hydration and could consider MiraLAX if needed    3.  Insomnia  Continue low dose clonazepam for sleep trouble as needed    4.  RBD  Has not been problematic recently.              RTC in 2-3 months. Patient can follow up sooner if needed. In the meantime, patient will contact the office with any questions or concerns.     -----------------------------------------------------------    Additional notes and data scanned including patient questionnaire which may contain pertinent information to visit.     Total appointment time was 41 minutes on day of service including reviewing prior records, patient encounter, care coordination, documentation    Kateri Plummer, MD, PhD   Co-Director  Movement Disorders Specialist  [facility]  [facility]    [address]  [address]  [place] [address]    [place], [place] [address]  T [phone]   F [phone]   T [phone]   F [phone]     [email or web address]

## 2020-03-27 ENCOUNTER — Other Ambulatory Visit (INDEPENDENT_AMBULATORY_CARE_PROVIDER_SITE_OTHER): Payer: Self-pay

## 2020-03-27 DIAGNOSIS — G20 Parkinson's disease: Secondary | ICD-10-CM

## 2020-03-27 MED ORDER — CARBIDOPA-LEVODOPA 25-100 MG PO TABS
1.0000 | ORAL_TABLET | Freq: Four times a day (QID) | ORAL | 1 refills | Status: DC
Start: 2020-03-27 — End: 2020-03-29

## 2020-03-27 MED ORDER — RASAGILINE MESYLATE 1 MG PO TABS
1.0000 mg | ORAL_TABLET | Freq: Every day | ORAL | 3 refills | Status: DC
Start: 2020-03-27 — End: 2020-05-24

## 2020-03-29 ENCOUNTER — Other Ambulatory Visit (INDEPENDENT_AMBULATORY_CARE_PROVIDER_SITE_OTHER): Payer: Self-pay

## 2020-04-02 MED ORDER — CARBIDOPA-LEVODOPA 25-100 MG PO TABS
1.0000 | ORAL_TABLET | Freq: Four times a day (QID) | ORAL | 1 refills | Status: DC
Start: 2020-04-02 — End: 2020-05-24

## 2020-05-24 ENCOUNTER — Encounter (INDEPENDENT_AMBULATORY_CARE_PROVIDER_SITE_OTHER): Payer: Self-pay | Admitting: Neurology

## 2020-05-24 ENCOUNTER — Telehealth (INDEPENDENT_AMBULATORY_CARE_PROVIDER_SITE_OTHER): Payer: Medicare Other | Admitting: Neurology

## 2020-05-24 VITALS — Ht 69.0 in | Wt 168.0 lb

## 2020-05-24 DIAGNOSIS — R251 Tremor, unspecified: Secondary | ICD-10-CM

## 2020-05-24 DIAGNOSIS — G20 Parkinson's disease: Secondary | ICD-10-CM

## 2020-05-24 DIAGNOSIS — G4700 Insomnia, unspecified: Secondary | ICD-10-CM

## 2020-05-24 DIAGNOSIS — G4752 REM sleep behavior disorder: Secondary | ICD-10-CM

## 2020-05-24 MED ORDER — CARBIDOPA-LEVODOPA 25-100 MG PO TABS
1.0000 | ORAL_TABLET | Freq: Four times a day (QID) | ORAL | 1 refills | Status: DC
Start: 2020-05-24 — End: 2020-08-30

## 2020-05-24 MED ORDER — NEUPRO 8 MG/24HR TD PT24
1.00 | MEDICATED_PATCH | Freq: Every day | TRANSDERMAL | 3 refills | Status: DC
Start: 2020-05-24 — End: 2020-12-04

## 2020-05-24 MED ORDER — RASAGILINE MESYLATE 1 MG PO TABS
1.0000 mg | ORAL_TABLET | Freq: Every day | ORAL | 3 refills | Status: DC
Start: 2020-05-24 — End: 2020-12-04

## 2020-05-24 MED ORDER — CLONAZEPAM 0.5 MG PO TABS
0.5000 mg | ORAL_TABLET | Freq: Every evening | ORAL | 5 refills | Status: DC
Start: 2020-05-24 — End: 2020-12-04

## 2020-05-24 NOTE — Progress Notes (Signed)
[email or web address]    Verbal consent has been obtained from the patient to conduct a video and telephone: yes      Subjective:      CC: PD    69 y.o.  F presenting to neurology clinic for f/u of tremors , and probable Parkinson's disease.  No major changes since her last appointment.  She still has a tremor in her hand, and sometimes it does not bother her too much other times it can be much more dramatic.  Certainly is worse when she is feeling anxious, stressed, or upset.  She is still taking the same doses of Neupro, Azilect, and Sinemet and she does feel that they help, she is not definitely having wearing off between every dose, but sometimes the symptoms can be worse throughout the day and she is trying to determine if it is anyway related to wearing off of medications.  She is interested in potentially considering DBS to see about help more with the tremor.      She denies any side effects to Sinemet, Azilect or Neupro.   No nausea, drowsiness, dizziness, sleep attacks, impulsivity, compulsivity, or leg edema.     Walking well, no falls.   Constipation is a bit better.  She reports generally sleeping well, no definite RBD.    Patient denies smoking, drinks occasionally.  She has not noticed that alcohol makes any change to her tremors.  No drug use.  Her father had Parkinson's disease and is deceased. No other family history of tremors.        Retained from initial Consultation:   Patient states that she has had tremors in her hands for years, but over the last year or so they have gotten much worse.  She feels like the tremors occur all the time in her hands and legs, and when she is lying down at night.  She feels like her whole body is shaking.  She was previously diagnosed with essential tremor.  She was tried on propranolol, but it did not seem to help at all.  She was tried on primidone, but even a low dose made her feel sick.  She is taking topiramate for weight loss, but it does  not seem to help her tremor.  She has some stiffness in her right shoulder, and she thinks this is from her psoriatic arthritis.  No other notable stiffness in arms and legs.  No change in gait, no significant instability.  No falls.  She does have long-standing constipation, and works with a gastroenterologist.  Occasional neck pain and back pain, but no shooting pain down arms or legs.  She does have occasional numbness in her left leg, simply in the left foot.  Denies any weakness in her arms and legs.    Denies anosmia.  No RBD.  She does often have trouble sleeping and wakes up overnight many nights to go to the restroom.  Denies any memory loss.  Denies hallucinations.         The following portions of the patient's history were reviewed and updated as appropriate: allergies, current medications, past family history, past medical history, past social history, past surgical history and problem list.    Review of Systems  Per HPI.  No recent illness.  Denies fever, chills, cough, sinus pain, eye pain, eye redness, ear pain, rhinorrhea, sore throat, chest pain, SOB, wheezing, abd pain, Nausea, Vomiting, diarrhea,  dysuria, or rashes.  All other ROS negative.  Objective:       Constitutional: NAD   Psych: Normal affect    Neurological:   Mental Status: Alert & oriented to person, place, month, & year.   Language: Spontaneous & fluent speech, good comprehension.    Cranial Nerves:  II, III: PERRL, VFF  III, IV, VI: EOMI, No nystagmus, no palsies, no ptosis  V: Intact to LT V1-V3 distribution bilaterally.   VI: Symmetrical face and expression.   VIII: Hearing intact to finger rub bilaterally.   IX, X: Palate/Uvula elevates symmetrically.   XI: 5/5 Trapezius & SCM bilaterally.   XII: Tongue is midline.     Motor Exam: Normal bulk and tone. No clonus. No pronator drift. Strength 5/5 throughout and symmetric., mild resting tremor in right hand only.  Postural tremor bilaterally, slightly worse on right.  Normal  fine motor movements, no definite increase in tone and no definite cogwheeling.      Cerebellar:   Finger to Nose: Mild postural intention tremor bilaterally.  No dysmetria.        Assessment:     Patient with long-standing tremors and possible restless leg syndrome.  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  She was started on Neupro, and is tolerating it well.   Also now on Azilect to 1 mg and Sinemet 25/100, 1 tab 4 times daily.  This seems to be providing good benefit, and her main symptom is now on intermittent tremor in her right hand only.      We had a long discussion about DBS, and certainly she would be a good candidate.  She does get good benefit from dopamine, but her tremor somewhat refractory and may respond better to DBS and oral medications in general, but also she is starting to have more fluctuations even with her current regimen.  She will give this thought, and she is interested we could do on-off testing and neuropsychological testing  Plan:     1.  Parkinson's diseasechronic, severe, progressively worsening condition  Continue the Sinemet 1 tab 4 times daily  Cont Neupro 8  mg daily.  Cont Azilect   1 mg daily.  Discussed possible risks, side effects and interactions.    She is interested in considering DBS, we could then do on-off testing    2.  Constipation  Provided recipe for constipation cocktail.   Better hydration and could consider MiraLAX if needed    3.  Insomnia  Continue low dose clonazepam for sleep trouble as needed    4.  RBD  Has not been problematic recently.              RTC in 2-3 months. Patient can follow up sooner if needed. In the meantime, patient will contact the office with any questions or concerns.     -----------------------------------------------------------    Additional notes and data scanned including patient questionnaire which may contain pertinent information to visit.     Total appointment time was 44 minutes on day of service including  reviewing prior records, patient encounter, care coordination, documentation    Kateri Plummer, MD, PhD  Co-Director  Movement Disorders Specialist  [place] [facility]  [facility]    [address]  [address]  [place] [address]    [place], [place] [address]  T [phone]   F [phone]   T [phone]   F [phone]     [email or web address]

## 2020-05-27 ENCOUNTER — Encounter (INDEPENDENT_AMBULATORY_CARE_PROVIDER_SITE_OTHER): Payer: Self-pay | Admitting: Neurology

## 2020-05-31 ENCOUNTER — Telehealth (INDEPENDENT_AMBULATORY_CARE_PROVIDER_SITE_OTHER): Payer: Medicare Other | Admitting: Neurology

## 2020-07-22 ENCOUNTER — Other Ambulatory Visit: Payer: Self-pay | Admitting: Rheumatology

## 2020-07-22 ENCOUNTER — Ambulatory Visit
Admission: RE | Admit: 2020-07-22 | Discharge: 2020-07-22 | Disposition: A | Discharge status: Home / Self Care | Payer: Medicare Other | Source: Ambulatory Visit | Attending: Rheumatology | Admitting: Rheumatology

## 2020-07-22 DIAGNOSIS — M25562 Pain in left knee: Secondary | ICD-10-CM

## 2020-07-22 DIAGNOSIS — M25561 Pain in right knee: Secondary | ICD-10-CM

## 2020-07-22 DIAGNOSIS — M25552 Pain in left hip: Secondary | ICD-10-CM

## 2020-08-16 ENCOUNTER — Encounter (INDEPENDENT_AMBULATORY_CARE_PROVIDER_SITE_OTHER): Payer: Self-pay | Admitting: Orthopaedic Surgery

## 2020-08-16 ENCOUNTER — Ambulatory Visit (INDEPENDENT_AMBULATORY_CARE_PROVIDER_SITE_OTHER): Payer: Medicare Other | Admitting: Orthopaedic Surgery

## 2020-08-16 DIAGNOSIS — M1712 Unilateral primary osteoarthritis, left knee: Secondary | ICD-10-CM

## 2020-08-16 NOTE — Progress Notes (Addendum)
Orthopaedic Trauma New Patient Visit    Chief Complaint   Patient presents with    Knee Pain     Bilateral knee pain        HPI: Yvette Anthony is a 69 y.o. female.  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  She has a history of psoriatic arthritis and has had the knee aspirated at least 10 times and has had injections of cortisone on right multiple occasions.  She thinks this time to consider knee replacement surgery but she is can to be moving to [place] on or around March 2022.  Additionally she has Parkinson's disease and is on medications for Parkinson's disease and has not had deep brain stimulation.  She states that she still walks reasonably well and does not use any ambulatory aids.  She has not had any recent falls.  She is unable to walk a mile due to pain.  She does not wear her knee brace at this time    ROS: Denies fevers, chills, chest pain, shortness of breath, nausea, vomiting, or diarrhea.    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      carbidopa-levodopa (SINEMET) 25-100 MG per tablet Take 1 tablet by mouth 4 (four) times daily 120 tablet 1    Cholecalciferol (VITAMIN D3) 1.25 MG (50000 UT) Cap Take 1 tablet by mouth once a week  1    folic acid (FOLVITE) 1 MG tablet Take 1 mg by mouth daily.      methotrexate 2.5 MG tablet Take 17.5 mg by mouth Q Sunday.          Omega-3 Fatty Acids (OMEGA-3 FISH OIL) 500 MG Cap Take  by mouth daily.          Probiotic Product (PROBIOTIC PO) Take by mouth daily.      Rasagiline Mesylate (Azilect) 1 MG Tab Take 1 tablet (1 mg total) by mouth daily 90 tablet 3    Rotigotine (Neupro) 8 MG/24HR Patch 24 hr Place 1 patch onto the skin daily 90 patch 3    clonazePAM (KlonoPIN) 0.5 MG tablet Take 1 tablet (0.5 mg total) by mouth nightly 30 tablet 5    omeprazole (PRILOSEC) 40 MG capsule Take 40 mg by mouth 2 (two) times daily.       No current facility-administered medications for this visit.     .  Allergies: Patient has no known allergies.  Past Medical History:   Diagnosis Date    Breast cancer 2000    Left-no chemo//no  radiation     Breast cancer 2005    right- +chemo LD 2005// + radiation LD 2006    Inguinal hernia, right     Parkinsons     Post-operative nausea and vomiting     Psoriatic arthritis 2016    Pulmonary embolism 2000    post-op mastectomy     Past Surgical  History:   Procedure Laterality Date    BREAST LUMPECTOMY Right 2005    BREAST RECONSTRUCTION Left 2001    HERNIORRHAPHY, INGUINAL Right 08/13/2017    Procedure: HERNIORRHAPHY, INGUINAL;  Surgeon: Verneda Skill, MD;  Location: [facility];  Service: General;  Laterality: Right;  RIGHT INGUINAL HERNIA OPEN    MASTECTOMY Left 2000    MYOMECTOMY  1985     History reviewed. No pertinent family history.  Social History     Tobacco Use    Smoking status: Former Smoker     Years: 10.00     Types: Cigarettes     Quit date: 10/17/1997     Years since quitting: 22.8    Smokeless tobacco: Never Used   Substance Use Topics    Alcohol use: Yes     Comment: <1/week       In addition to findings reviewed in EPIC, relevant Medical/Family/Social History noted in today's HPI, otherwise are noncontributory to today's visit.    Physical Exam: Pt is a well developed, well nourished 69 y.o. year old female who is awake, alert, and oriented.  The patient has a normal affect and is in no acute distress.  Vitals:    08/16/20  1110   BP: 168/78   Pulse: 73   Resp: 16   SpO2: 97%       Gait: Gait is relatively normal and not antalgic  Sickle exam of both knees are similar.  There is +2 effusions with range of motion 0 to 120 degrees with medial and lateral joint line tenderness with  collateral laxity consistent with instability and cruciate ligament stability with relatively smooth patellofemoral tracking with a negative apprehension sign and no medial retinacular tenderness.  For comparison, their contralateral extremity is normal on exam with no pain nor crepitus.    Radiology:     Xrays taken in clinic today: Trays taken previously demonstrate severe tricompartmental arthritis involving both knees    Impression: Psoriatic arthritis bilateral knees left worse than right with clinical instability bilateral knees    Plan: The patient has not had formal physical therapy we will prescribe a course of this.  I am going to fit her with an Ascender knee brace on the left knee and will check her back in 3 weeks to see how things are going.  If they are not doing well she may want to move towards knee replacement surgery before she moves to [place] but if the brace is helping quite a bit she may want to get the move done first and she also has to move her mother separately and then find an orthopedist in [place] to do her knee replacement surgery.  We will check her back in 3 weeks and we will obtain repeat x-rays including standing views of both knees

## 2020-08-21 ENCOUNTER — Other Ambulatory Visit (INDEPENDENT_AMBULATORY_CARE_PROVIDER_SITE_OTHER): Payer: Self-pay

## 2020-08-21 DIAGNOSIS — G20 Parkinson's disease: Secondary | ICD-10-CM

## 2020-08-21 DIAGNOSIS — R2681 Unsteadiness on feet: Secondary | ICD-10-CM

## 2020-08-21 DIAGNOSIS — M1712 Unilateral primary osteoarthritis, left knee: Secondary | ICD-10-CM

## 2020-08-23 ENCOUNTER — Other Ambulatory Visit (INDEPENDENT_AMBULATORY_CARE_PROVIDER_SITE_OTHER): Payer: Self-pay | Admitting: Orthopaedic Surgery

## 2020-08-30 ENCOUNTER — Encounter (INDEPENDENT_AMBULATORY_CARE_PROVIDER_SITE_OTHER): Payer: Self-pay | Admitting: Neurology

## 2020-08-30 ENCOUNTER — Telehealth (INDEPENDENT_AMBULATORY_CARE_PROVIDER_SITE_OTHER): Payer: Medicare Other | Admitting: Neurology

## 2020-08-30 VITALS — Ht 70.0 in | Wt 168.0 lb

## 2020-08-30 DIAGNOSIS — G20 Parkinson's disease: Secondary | ICD-10-CM

## 2020-08-30 DIAGNOSIS — R251 Tremor, unspecified: Secondary | ICD-10-CM

## 2020-08-30 MED ORDER — CARBIDOPA-LEVODOPA 25-100 MG PO TABS
1.5000 | ORAL_TABLET | Freq: Four times a day (QID) | ORAL | 3 refills | Status: DC
Start: 2020-08-30 — End: 2020-12-04

## 2020-08-30 NOTE — Progress Notes (Signed)
[email or web address]    Verbal consent has been obtained from the patient to conduct a video and telephone: yes      Subjective:      CC: PD    69 y.o.  F presenting to neurology clinic for f/u of tremors , and probable Parkinson's disease.  She reports that she is generally doing well.  She does feel that she is moving well and thinks her overall symptoms are well controlled with Neupro, Azilect, and the Sinemet.  Occasionally she does have slightly worsening tremor feels little stiffer in the right hand as well.  Denies any side effects or problems with the Sinemet, Azilect or Neupro.   No nausea, drowsiness, dizziness, sleep attacks, impulsivity, compulsivity, or leg edema.  No definite wearing off, and no dyskinesias.    Walking well, no falls.   Constipation is reportedly doing well.  She reports generally sleeping well, no definite RBD.    Patient denies smoking, drinks occasionally.  She has not noticed that alcohol makes any change to her tremors.  No drug use.  Her father had Parkinson's disease and is deceased. No other family history of tremors.        Retained from initial Consultation:   Patient states that she has had tremors in her hands for years, but over the last year or so they have gotten much worse.  She feels like the tremors occur all the time in her hands and legs, and when she is lying down at night.  She feels like her whole body is shaking.  She was previously diagnosed with essential tremor.  She was tried on propranolol, but it did not seem to help at all.  She was tried on primidone, but even a low dose made her feel sick.  She is taking topiramate for weight loss, but it does not seem to help her tremor.  She has some stiffness in her right shoulder, and she thinks this is from her psoriatic arthritis.  No other notable stiffness in arms and legs.  No change in gait, no significant instability.  No falls.  She does have long-standing constipation, and works with a  gastroenterologist.  Occasional neck pain and back pain, but no shooting pain down arms or legs.  She does have occasional numbness in her left leg, simply in the left foot.  Denies any weakness in her arms and legs.    Denies anosmia.  No RBD.  She does often have trouble sleeping and wakes up overnight many nights to go to the restroom.  Denies any memory loss.  Denies hallucinations.         The following portions of the patient's history were reviewed and updated as appropriate: allergies, current medications, past family history, past medical history, past social history, past surgical history and problem list.    Review of Systems  Per HPI.  No recent illness.  Denies fever, chills, cough, sinus pain, eye pain, eye redness, ear pain, rhinorrhea, sore throat, chest pain, SOB, wheezing, abd pain, Nausea, Vomiting, diarrhea,  dysuria, or rashes.  All other ROS negative.       Objective:       Constitutional: NAD   Psych: Normal affect    Neurological:   Mental Status: Alert & oriented to person, place, month, & year.   Language: Spontaneous & fluent speech, good comprehension.    Cranial Nerves:  II, III: PERRL, VFF  III, IV, VI: EOMI, No nystagmus, no palsies, no ptosis  V:  Intact to LT V1-V3 distribution bilaterally.   VI: Symmetrical face and expression.   VIII: Hearing intact to finger rub bilaterally.   IX, X: Palate/Uvula elevates symmetrically.   XI: 5/5 Trapezius & SCM bilaterally.   XII: Tongue is midline.     Motor Exam: Normal bulk and tone. No clonus. No pronator drift. Strength 5/5 throughout and symmetric., mild resting tremor in right hand only.  Postural tremor bilaterally, slightly worse on right.  Normal fine motor movements, no definite increase in tone and no definite cogwheeling.      Cerebellar:   Finger to Nose: Mild postural intention tremor bilaterally.  No dysmetria.        Assessment:     Patient with long-standing tremors and possible restless leg syndrome.  DAT scan was positive for  reduced dopamanergic activity.  She was started on Neupro, and is tolerating it well.   Also now on Azilect to 1 mg and Sinemet 25/100, 1 tab 4 times daily.  This seems to be providing good benefit, and her main symptom is now on intermittent tremor in her right hand only.  We will try to increase to 1.5 tabs 4 times daily, but she could certainly still consider DBS in the future.      Previously, we had a long discussion about DBS, and certainly she would be a good candidate.  She does get good benefit from dopamine, but her tremor somewhat refractory and may respond better to DBS and oral medications in general, but also she is starting to have more fluctuations even with her current regimen.  She will give this thought, and she is interested we could do on-off testing and neuropsychological testing  Plan:     1.  Parkinson's disease-chronic, severe, progressively worsening condition  Sinemet 1.5 tab 4 times daily  Cont Neupro 8  mg daily.  Cont Azilect   1 mg daily.  Discussed possible risks, side effects and interactions.    If she is interested in considering DBS, we could then do on-off testing    2.  Constipation  Provided recipe for constipation cocktail.   Better hydration and could consider MiraLAX if needed    3.  Insomnia  Continue low dose clonazepam for sleep trouble as needed    4.  RBD  Has not been problematic recently.        RTC in 2-3 months. Patient can follow up sooner if needed. In the meantime, patient will contact the office with any questions or concerns.     -----------------------------------------------------------    Additional notes and data scanned including patient questionnaire which may contain pertinent information to visit.     Total appointment time was 41 minutes on day of service including reviewing prior records, patient encounter, care coordination, documentation    Kateri Plummer, MD, PhD  Co-Director  Movement Disorders Specialist  [place] [facility]  [facility]  [facility]    [address]  [address]  [place] [address]    [place], [place] [address]  T [phone]   F [phone]   T [phone]   F [phone]     [email or web address]

## 2020-09-02 ENCOUNTER — Encounter (INDEPENDENT_AMBULATORY_CARE_PROVIDER_SITE_OTHER): Payer: Self-pay | Admitting: Neurology

## 2020-09-13 ENCOUNTER — Ambulatory Visit (INDEPENDENT_AMBULATORY_CARE_PROVIDER_SITE_OTHER): Payer: Medicare Other | Admitting: Orthopaedic Surgery

## 2020-12-04 ENCOUNTER — Encounter (INDEPENDENT_AMBULATORY_CARE_PROVIDER_SITE_OTHER): Payer: Self-pay | Admitting: Neurology

## 2020-12-04 ENCOUNTER — Ambulatory Visit (INDEPENDENT_AMBULATORY_CARE_PROVIDER_SITE_OTHER): Payer: Medicare Other | Admitting: Neurology

## 2020-12-04 VITALS — BP 169/90 | HR 80

## 2020-12-04 DIAGNOSIS — R251 Tremor, unspecified: Secondary | ICD-10-CM

## 2020-12-04 DIAGNOSIS — G4752 REM sleep behavior disorder: Secondary | ICD-10-CM

## 2020-12-04 DIAGNOSIS — G20 Parkinson's disease: Secondary | ICD-10-CM

## 2020-12-04 DIAGNOSIS — G4700 Insomnia, unspecified: Secondary | ICD-10-CM

## 2020-12-04 DIAGNOSIS — R2681 Unsteadiness on feet: Secondary | ICD-10-CM

## 2020-12-04 MED ORDER — RASAGILINE MESYLATE 1 MG PO TABS
1.0000 mg | ORAL_TABLET | Freq: Every day | ORAL | 3 refills | Status: AC
Start: 2020-12-04 — End: ?

## 2020-12-04 MED ORDER — CARBIDOPA-LEVODOPA 25-100 MG PO TABS
1.5000 | ORAL_TABLET | Freq: Four times a day (QID) | ORAL | 3 refills | Status: AC
Start: 2020-12-04 — End: ?

## 2020-12-04 MED ORDER — NEUPRO 8 MG/24HR TD PT24
1.0000 | MEDICATED_PATCH | Freq: Every day | TRANSDERMAL | 3 refills | Status: AC
Start: 2020-12-04 — End: ?

## 2020-12-04 MED ORDER — CLONAZEPAM 0.5 MG PO TABS
0.5000 mg | ORAL_TABLET | Freq: Every evening | ORAL | 5 refills | Status: DC
Start: 2020-12-04 — End: 2021-03-12

## 2020-12-04 NOTE — Progress Notes (Signed)
[email or web address]      Subjective:      CC: PD    70 y.o.  F presenting to neurology clinic for f/u of tremors , and probable Parkinson's disease. She reports has been a difficult few months. 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 As a result, she feels like she is not walking as well. She feels more unsteady and had a fall yesterday. [place], no injuries. She still feels like her medicine is helping, but certainly with stress and anxiety her hands are feels shakier and her Parkinson symptoms are generally worse. Otherwise, her medicines are helping with no definite wearing off between doses, no side effects and no dyskinesias.      Constipation is reportedly doing well.  She reports generally sleeping well, no definite RBD.    Patient denies smoking, drinks occasionally.  She has not noticed that alcohol makes any change to her tremors.  No drug use.  Her father had Parkinson's disease and is deceased. No other family history of tremors.        Retained from initial Consultation:   Patient states that she has had tremors in her hands for years, but over the last year or so they have gotten much worse.  She feels like the tremors occur all the time in her hands and legs, and when she is lying down at night.  She feels like her whole body is shaking.  She was previously diagnosed with essential tremor.  She was tried on propranolol, but it did not seem to help at all.  She was tried on primidone, but even a low dose made her feel sick.  She is taking topiramate for weight loss, but it does not seem to help her tremor.  She has some stiffness in her right shoulder, and she thinks this is from her psoriatic arthritis.  No other notable stiffness in arms and legs.  No change in gait, no significant instability.  No falls.  She does have long-standing constipation, and works with a gastroenterologist.   Occasional neck pain and back pain, but no shooting pain down arms or legs.  She does have occasional numbness in her left leg, simply in the left foot.  Denies any weakness in her arms and legs.    Denies anosmia.  No RBD.  She does often have trouble sleeping and wakes up overnight many nights to go to the restroom.  Denies any memory loss.  Denies hallucinations.         The following portions of the patient's history were reviewed and updated as appropriate: allergies, current medications, past family history, past medical history, past social history, past surgical history and problem list.    Review of Systems  Per HPI.  No recent illness.  Denies fever, chills, cough, sinus pain, eye pain, eye redness, ear pain, rhinorrhea, sore throat, chest pain, SOB, wheezing, abd pain, Nausea, Vomiting, diarrhea,  dysuria, or rashes.  All other ROS negative.       Objective:     Vitals:    12/04/20 1103   BP: 169/90   Pulse: 80         Constitutional: NAD   Psych: Normal affect    Neurological:   Mental Status: Alert & oriented to person, place, month, & year.   Language: Spontaneous & fluent speech, good comprehension.    Cranial Nerves:  II,  III: PERRL, VFF  III, IV, VI: EOMI, No nystagmus, no palsies, no ptosis  V: Intact to LT V1-V3 distribution bilaterally.   VI: Symmetrical face and expression.   VIII: Hearing intact to finger rub bilaterally.   IX, X: Palate/Uvula elevates symmetrically.   XI: 5/5 Trapezius & SCM bilaterally.   XII: Tongue is midline.     Motor Exam: Normal bulk and tone. No clonus. No pronator drift. Strength 5/5 throughout and symmetric., mild resting tremor in right hand only.  Postural tremor bilaterally, slightly worse on right.  Normal fine motor movements, no definite increase in tone and no definite cogwheeling.      Cerebellar:   Finger to Nose: Mild postural intention tremor bilaterally.  No dysmetria.    Gait, slightly stooped, decreased armswing bilaterally more so on the right. Not  definitely shuffling but is taking small steps.    [id number]    Assessment:     Patient with long-standing tremors and possible restless leg syndrome.  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  She was started on Neupro, and is tolerating it well. Over time as her symptoms have worsened, we added rasagiline and levodopa. Currently the combination of this medication seems to be helping with still intermittent tremor in her right hand but otherwise generally good movement.    That being said, it sounds like she has been having more gait instability. Somewhat from deconditioning and lack of activity but also to a degree from worsening balance in general. Therefore, we will keep her medicines the same and recommended physical therapy.    Previously, we had a long discussion about DBS, and certainly she would be a good candidate.  She does get good benefit from dopamine, but her tremor somewhat refractory and may respond better to DBS and oral medications in general, but also she is starting to have more fluctuations even with her current regimen.  She will give this thought, and she is interested we could do on-off testing and neuropsychological testing  Plan:     1.  Parkinson's diseasechronic, severe, progressively worsening condition  Sinemet 1.5 tab 4 times daily  Cont Neupro 8  mg daily.  Cont Azilect   1 mg daily.  Discussed possible risks, side effects and interactions.    If she is interested in considering DBS, we could then do on-off testing    2.  Constipation  Provided recipe for constipation cocktail.   Better hydration and could consider MiraLAX if needed    3.  Insomnia  Continue low dose clonazepam for sleep trouble as needed    4.  RBD  Has not been problematic recently.    5. Gait instability    Referral provided for physical therapy    RTC in 2-3 months. Patient can follow up sooner if needed. In the meantime, patient will contact the office with any questions or concerns.      -----------------------------------------------------------    Additional notes and data scanned including patient questionnaire which may contain pertinent information to visit.     Total appointment time was 44 minutes on day of service including reviewing prior records, patient encounter, care coordination, documentation    Kateri Plummer, MD, PhD  Co-Director  Movement Disorders Specialist  [facility]  [facility]    [address]  [address]  [place] [address]    [place], [place] [address]  T [phone]   F [phone]   T [phone]  F [phone]     [email or web address]

## 2021-03-12 ENCOUNTER — Other Ambulatory Visit (INDEPENDENT_AMBULATORY_CARE_PROVIDER_SITE_OTHER): Payer: Self-pay

## 2021-03-12 DIAGNOSIS — G20 Parkinson's disease: Secondary | ICD-10-CM

## 2021-03-12 DIAGNOSIS — G4752 REM sleep behavior disorder: Secondary | ICD-10-CM

## 2021-03-12 DIAGNOSIS — G4700 Insomnia, unspecified: Secondary | ICD-10-CM

## 2021-03-13 MED ORDER — CLONAZEPAM 0.5 MG PO TABS
0.5000 mg | ORAL_TABLET | Freq: Every evening | ORAL | 5 refills | Status: AC
Start: 2021-03-13 — End: ?

## 2021-05-08 DIAGNOSIS — M25469 Effusion, unspecified knee: Secondary | ICD-10-CM | POA: Insufficient documentation

## 2021-05-28 DIAGNOSIS — Z7960 Long term (current) use of unspecified immunomodulators and immunosuppressants: Secondary | ICD-10-CM | POA: Insufficient documentation

## 2021-05-28 DIAGNOSIS — L4050 Arthropathic psoriasis, unspecified: Secondary | ICD-10-CM | POA: Insufficient documentation

## 2021-05-28 DIAGNOSIS — L409 Psoriasis, unspecified: Secondary | ICD-10-CM | POA: Insufficient documentation

## 2021-06-02 DIAGNOSIS — C50419 Malignant neoplasm of upper-outer quadrant of unspecified female breast: Secondary | ICD-10-CM | POA: Insufficient documentation

## 2021-06-02 DIAGNOSIS — Z171 Estrogen receptor negative status [ER-]: Secondary | ICD-10-CM | POA: Insufficient documentation

## 2021-06-02 DIAGNOSIS — Z853 Personal history of malignant neoplasm of breast: Secondary | ICD-10-CM | POA: Insufficient documentation

## 2021-06-11 ENCOUNTER — Inpatient Hospital Stay: Payer: Self-pay | Admitting: Internal Medicine

## 2021-06-11 ENCOUNTER — Inpatient Hospital Stay: Payer: Self-pay

## 2021-06-19 DIAGNOSIS — Z96659 Presence of unspecified artificial knee joint: Secondary | ICD-10-CM | POA: Insufficient documentation

## 2021-06-30 ENCOUNTER — Inpatient Hospital Stay: Payer: Self-pay

## 2021-06-30 ENCOUNTER — Inpatient Hospital Stay: Payer: Self-pay | Admitting: Internal Medicine

## 2021-08-07 ENCOUNTER — Other Ambulatory Visit: Payer: Self-pay

## 2021-08-07 ENCOUNTER — Encounter: Payer: Medicare Other | Attending: Physician Assistant | Admitting: Physician Assistant

## 2021-08-07 DIAGNOSIS — G20 Parkinson's disease: Secondary | ICD-10-CM | POA: Insufficient documentation

## 2021-08-07 DIAGNOSIS — L4059 Other psoriatic arthropathy: Secondary | ICD-10-CM | POA: Diagnosis not present

## 2021-08-07 DIAGNOSIS — L89153 Pressure ulcer of sacral region, stage 3: Secondary | ICD-10-CM | POA: Insufficient documentation

## 2021-08-07 DIAGNOSIS — Z96652 Presence of left artificial knee joint: Secondary | ICD-10-CM | POA: Insufficient documentation

## 2021-08-07 NOTE — Progress Notes (Addendum)
Dorothy Mccoy, Dorothy Mccoy ([id number]) Visit Report for 08/07/2021 Allergy List Details Patient Name: Dorothy Mccoy, Dorothy Mccoy. Date of Service: 08/07/2021 10:15 AM Medical Record Number: [id number] Patient Account Number: [id number] Date of Birth/Sex: 1951-02-18 (70 y.o. F) Treating RN: Donnamarie Poag Primary Care Aric Jost: SYSTEM, PCP Other Clinician: Referring Mikael Debell: Kurtis Bushman Treating Chancey Ringel/Extender: Jeri Cos Weeks in Treatment: 0 Allergies Active Allergies Cipro Allergy Notes Electronic Signature(s) Signed: 08/07/2021 12:02:10 PM By: Donnamarie Poag Entered By: Donnamarie Poag on 08/07/2021 10:25:00 [name] -------------------------------------------------------------------------------- Arrival Information Details Patient Name: Dorothy Mccoy. Date of Service: 08/07/2021 10:15 AM Medical Record Number: [id number] Patient Account Number: [id number] Date of Birth/Sex: [birth year] (70 y.o. F) Treating RN: Donnamarie Poag Primary Care Candance Bohlman: SYSTEM, PCP Other Clinician: Referring Jonn Chaikin: Kurtis Bushman Treating Milad Bublitz/Extender: Skipper Cliche in Treatment: 0 Visit Information Patient Arrived: Walker Arrival Time: 10:20 Accompanied By: self Transfer Assistance: None Patient Identification Verified: Yes Secondary Verification Process Completed: Yes Electronic Signature(s) Signed: 08/07/2021 12:02:10 PM By: Donnamarie Poag Entered By: Donnamarie Poag on 08/07/2021 10:21:04 [name] -------------------------------------------------------------------------------- Clinic Level of Care Assessment Details Patient Name: Dorothy Mccoy. Date of Service: 08/07/2021 10:15 AM Medical Record Number: [id number] Patient Account Number: [id number] Date of Birth/Sex: [birth year] (70 y.o. F) Treating RN: Donnamarie Poag Primary Care Lai Hendriks: SYSTEM, PCP Other Clinician: Referring Ansley Stanwood: Kurtis Bushman Treating Gary Bultman/Extender: Skipper Cliche in Treatment: 0 Clinic Level of Care  Assessment Items TOOL 1 Quantity Score []  - Use when EandM and Procedure is performed on INITIAL visit 0 ASSESSMENTS - Nursing Assessment / Reassessment X - General Physical Exam (combine w/ comprehensive assessment (listed just below) when performed on new 1 20 pt. evals) []  - 0 Comprehensive Assessment (HX, ROS, Risk Assessments, Wounds Hx, etc.) ASSESSMENTS - Wound and Skin Assessment / Reassessment []  - Dermatologic / Skin Assessment (not related to wound area) 0 ASSESSMENTS - Ostomy and/or Continence Assessment and Care []  - Incontinence Assessment and Management 0 []  - 0 Ostomy Care Assessment and Management (repouching, etc.) PROCESS - Coordination of Care X - Simple Patient / Family Education for ongoing care 1 15 []  - 0 Complex (extensive) Patient / Family Education for ongoing care X- 1 10 Staff obtains Programmer, systems, Records, Test Results / Process Orders X- 1 10 Staff telephones HHA, Nursing Homes / Clarify orders / etc []  - 0 Routine Transfer to another Facility (non-emergent condition) []  - 0 Routine Hospital Admission (non-emergent condition) X- 1 15 New Admissions / Biomedical engineer / Ordering NPWT, Apligraf, etc. []  - 0 Emergency Hospital Admission (emergent condition) PROCESS - Special Needs []  - Pediatric / Minor Patient Management 0 []  - 0 Isolation Patient Management []  - 0 Hearing / Language / Visual special needs []  - 0 Assessment of Community assistance (transportation, D/C planning, etc.) []  - 0 Additional assistance / Altered mentation X- 1 15 Support Surface(s) Assessment (bed, cushion, seat, etc.) INTERVENTIONS - Miscellaneous []  - External ear exam 0 []  - 0 Patient Transfer (multiple staff / Civil Service fast streamer / Similar devices) []  - 0 Simple Staple / Suture removal (25 or less) []  - 0 Complex Staple / Suture removal (26 or more) []  - 0 Hypo/Hyperglycemic Management (do not check if billed separately) []  - 0 Ankle / Brachial Index (ABI) -  do not check if billed separately Has the patient been seen at the hospital within the last three years: Yes Total Score: 85 Level Of Care: New/Established - Level 3 Dorothy Mccoy, Dorothy Mccoy ([id number]) Electronic Signature(s) Signed: 08/07/2021 12:02:10 PM  By: Donnamarie Poag Entered By: Donnamarie Poag on 08/07/2021 11:16:50 [name] -------------------------------------------------------------------------------- Encounter Discharge Information Details Patient Name: Dorothy Mccoy, Dorothy Mccoy. Date of Service: 08/07/2021 10:15 AM Medical Record Number: [id number] Patient Account Number: [id number] Date of Birth/Sex: 11-13-1950 (70 y.o. F) Treating RN: Donnamarie Poag Primary Care Tomika Eckles: SYSTEM, PCP Other Clinician: Referring Mayumi Summerson: Kurtis Bushman Treating Roney Youtz/Extender: Skipper Cliche in Treatment: 0 Encounter Discharge Information Items Post Procedure Vitals Discharge Condition: Stable Temperature (F): 98.3 Ambulatory Status: Walker Pulse (bpm): 82 Discharge Destination: Home Respiratory Rate (breaths/min): 16 Transportation: Private Auto Blood Pressure (mmHg): 145/73 Accompanied By: self Schedule Follow-up Appointment: Yes Clinical Summary of Care: Electronic Signature(s) Signed: 08/07/2021 12:02:10 PM By: Donnamarie Poag Entered By: Donnamarie Poag on 08/07/2021 11:23:54 [name] -------------------------------------------------------------------------------- Lower Extremity Assessment Details Patient Name: Dorothy Mccoy, Dorothy Mccoy. Date of Service: 08/07/2021 10:15 AM Medical Record Number: [id number] Patient Account Number: [id number] Date of Birth/Sex: 05/14/1951 (70 y.o. F) Treating RN: Donnamarie Poag Primary Care Arieanna Pressey: SYSTEM, PCP Other Clinician: Referring Iridessa Harrow: Kurtis Bushman Treating Brandi Tomlinson/Extender: Jeri Cos Weeks in Treatment: 0 Electronic Signature(s) Signed: 08/07/2021 12:02:10 PM By: Donnamarie Poag Entered By: Donnamarie Poag on 08/07/2021 10:31:13 [name]) -------------------------------------------------------------------------------- Multi Wound Chart Details Patient Name: Dorothy Mccoy, Dorothy Mccoy. Date of Service: 08/07/2021 10:15 AM Medical Record Number: [id number] Patient Account Number: [id number] Date of Birth/Sex: [birth year] (70 y.o. F) Treating RN: Donnamarie Poag Primary Care Climmie Cronce: SYSTEM, PCP Other Clinician: Referring Abriella Filkins: Kurtis Bushman Treating Aleshka Corney/Extender: Skipper Cliche in Treatment: 0 Vital Signs Height(in): 69 Pulse(bpm): 59 Weight(lbs): 175 Blood Pressure(mmHg): 145/73 Body Mass Index(BMI): 26 Temperature(F): 98.4 Respiratory Rate(breaths/min): 16 Photos: [N/A:N/A] Wound Location: Superior Gluteal fold N/A N/A Wounding Event: Pressure Injury N/A N/A Primary Etiology: Pressure Ulcer N/A N/A Comorbid History: Cataracts, Received Chemotherapy, N/A N/A Received Radiation Date Acquired: 06/25/2021 N/A N/A Weeks of Treatment: 0 N/A N/A Wound Status: Open N/A N/A Measurements L x W x D (cm) 0.8x0.2x0.2 N/A N/A Area (cm) : 0.126 N/A N/A Volume (cm) : 0.025 N/A N/A Classification: Category/Stage III N/A N/A Exudate Amount: Medium N/A N/A Exudate Type: Serous N/A N/A Exudate Color: amber N/A N/A Granulation Amount: Small (1-33%) N/A N/A Granulation Quality: Pink N/A N/A Necrotic Amount: Large (67-100%) N/A N/A Exposed Structures: Fat Layer (Subcutaneous Tissue): N/A N/A Yes Fascia: No Tendon: No Muscle: No Joint: No Bone: No Treatment Notes Electronic Signature(s) Signed: 08/07/2021 12:02:10 PM By: Donnamarie Poag Entered By: Donnamarie Poag on 08/07/2021 11:05:18 [name] ([id number]) -------------------------------------------------------------------------------- [place] Details Patient Name: Dorothy Mccoy, Dorothy Mccoy. Date of Service: 08/07/2021 10:15 AM Medical Record Number: [id number] Patient Account Number: [id number] Date of Birth/Sex: August 03, 1951 (70 y.o. F) Treating RN:  Donnamarie Poag Primary Care Cid Agena: SYSTEM, PCP Other Clinician: Referring Aften Lipsey: Kurtis Bushman Treating Jeena Arnett/Extender: Skipper Cliche in Treatment: 0 Active Inactive Wound/Skin Impairment Nursing Diagnoses: Impaired tissue integrity Knowledge deficit related to smoking impact on wound healing Knowledge deficit related to ulceration/compromised skin integrity Goals: Patient/caregiver will verbalize understanding of skin care regimen Date Initiated: 08/07/2021 Target Resolution Date: 08/16/2021 Goal Status: Active Ulcer/skin breakdown will have a volume reduction of 30% by week 4 Date Initiated: 08/07/2021 Target Resolution Date: 09/07/2021 Goal Status: Active Ulcer/skin breakdown will have a volume reduction of 50% by week 8 Date Initiated: 08/07/2021 Target Resolution Date: 10/07/2021 Goal Status: Active Ulcer/skin breakdown will have a volume reduction of 80% by week 12 Date Initiated: 08/07/2021 Target Resolution Date: 11/07/2021 Goal Status: Active Ulcer/skin breakdown will heal within 14 weeks Date  Initiated: 08/07/2021 Target Resolution Date: 11/16/2021 Goal Status: Active Interventions: Assess patient/caregiver ability to obtain necessary supplies Assess patient/caregiver ability to perform ulcer/skin care regimen upon admission and as needed Assess ulceration(s) every visit Notes: Electronic Signature(s) Signed: 08/07/2021 12:02:10 PM By: Donnamarie Poag Entered By: Donnamarie Poag on 08/07/2021 11:04:56 [name]. ([id number]) -------------------------------------------------------------------------------- Pain Assessment Details Patient Name: Dorothy Mccoy, Dorothy Mccoy. Date of Service: 08/07/2021 10:15 AM Medical Record Number: [id number] Patient Account Number: [id number] Date of Birth/Sex: [birth year] (70 y.o. F) Treating RN: Donnamarie Poag Primary Care Steffani Dionisio: SYSTEM, PCP Other Clinician: Referring Lenton Gendreau: Kurtis Bushman Treating Abdulloh Ullom/Extender: Skipper Cliche in  Treatment: 0 Active Problems Location of Pain Severity and Description of Pain Patient Has Paino Yes Site Locations Pain Location: Pain in Ulcers Rate the pain. Current Pain Level: 7 Pain Management and Medication Current Pain Management: Electronic Signature(s) Signed: 08/07/2021 12:02:10 PM By: Donnamarie Poag Entered By: Donnamarie Poag on 08/07/2021 10:23:08 [name] ([id number]) -------------------------------------------------------------------------------- Patient/Caregiver Education Details Patient Name: Dorothy Mccoy, Dorothy Mccoy. Date of Service: 08/07/2021 10:15 AM Medical Record Number: [id number] Patient Account Number: [id number] Date of Birth/Gender: 28-Apr-1951 (70 y.o. F) Treating RN: Donnamarie Poag Primary Care Physician: SYSTEM, PCP Other Clinician: Referring Physician: Kurtis Bushman Treating Physician/Extender: Skipper Cliche in Treatment: 0 Education Assessment Education Provided To: Patient Education Topics Provided Basic Hygiene: Nutrition: Offloading: Wound Debridement: Wound/Skin Impairment: Electronic Signature(s) Signed: 08/07/2021 12:02:10 PM By: Donnamarie Poag Entered By: Donnamarie Poag on 08/07/2021 11:17:11 [name] ([id number]) -------------------------------------------------------------------------------- Wound Assessment Details Patient Name: Dorothy Mccoy, Dorothy Mccoy. Date of Service: 08/07/2021 10:15 AM Medical Record Number: [id number] Patient Account Number: [id number] Date of Birth/Sex: [birth year] (70 y.o. F) Treating RN: Donnamarie Poag Primary Care Richmond Coldren: SYSTEM, PCP Other Clinician: Referring Tashanda Fuhrer: Kurtis Bushman Treating Laine Giovanetti/Extender: Skipper Cliche in Treatment: 0 Wound Status Wound Number: 1 Primary Etiology: Pressure Ulcer Wound Location: Midline, Superior Sacrum Wound Status: Open Wounding Event: Pressure Injury Comorbid Cataracts, Received Chemotherapy, Received History: Radiation Date Acquired: 06/25/2021 Weeks Of Treatment: 0 Clustered  Wound: No Photos Wound Measurements Length: (cm) 0.8 Width: (cm) 0.2 Depth: (cm) 0.2 Area: (cm) 0.126 Volume: (cm) 0.025 % Reduction in Area: 0% % Reduction in Volume: 0% Tunneling: No Undermining: No Wound Description Classification: Category/Stage III Exudate Amount: Medium Exudate Type: Serous Exudate Color: amber Foul Odor After Cleansing: No Slough/Fibrino Yes Wound Bed Granulation Amount: Small (1-33%) Exposed Structure Granulation Quality: Pink Fascia Exposed: No Necrotic Amount: Large (67-100%) Fat Layer (Subcutaneous Tissue) Exposed: Yes Tendon Exposed: No Muscle Exposed: No Joint Exposed: No Bone Exposed: No Treatment Notes Wound #1 (Sacrum) Wound Laterality: Midline, Superior Cleanser Wound Cleanser Discharge Instruction: Wash your hands with soap and water. Remove old dressing, discard into plastic bag and place into trash. Cleanse the wound with Wound Cleanser prior to applying a clean dressing using gauze sponges, not tissues or cotton balls. Do not scrub or use excessive force. Pat dry using gauze sponges, not tissue or cotton balls. Dorothy Mccoy, Dorothy Mccoy ([id number]) Peri-Wound Care Topical Primary Dressing Hydrofera Blue Ready Transfer Foam, 2.5x2.5 (in/in) Discharge Instruction: cut to fit wound, then bolster a slightly larger piece on top, Apply Hydrofera Blue Ready to wound bed as directed Secondary Dressing Gauze Discharge Instruction: Fold in half and bolster on top of HBlue to cover Secured With [place] Surgical Tape, 2x2 (in/yd) Compression Wrap Compression Stockings Add-Ons Electronic Signature(s) Signed: 08/07/2021 4:14:40 PM By: Donnamarie Poag Previous Signature: 08/07/2021 12:02:10 PM Version By: Donnamarie Poag Entered ByDonnamarie Poag on 08/07/2021 12:04:36  Dorothy Mccoy, Dorothy Mccoy ([id number]) -------------------------------------------------------------------------------- [place] Details Patient Name: LORELY, BUBB. Date of Service:  08/07/2021 10:15 AM Medical Record Number: [id number] Patient Account Number: [id number] Date of Birth/Sex: Dec 31, 1950 (70 y.o. F) Treating RN: Donnamarie Poag Primary Care Kathern Lobosco: SYSTEM, PCP Other Clinician: Referring Alannie Amodio: Kurtis Bushman Treating Schwanda Zima/Extender: Skipper Cliche in Treatment: 0 Vital Signs Time Taken: 10:20 Temperature (F): 98.4 Height (in): 69 Pulse (bpm): 82 Source: Stated Respiratory Rate (breaths/min): 16 Weight (lbs): 175 Blood Pressure (mmHg): 145/73 Source: Stated Reference Range: 80 - 120 mg / dl Body Mass Index (BMI): 25.8 Notes stated just weighed at referring doctor and recent knee replacement Electronic Signature(s) Signed: 08/07/2021 12:02:10 PM By: Donnamarie Poag Entered ByDonnamarie Poag on 08/07/2021 10:24:16

## 2021-08-07 NOTE — Progress Notes (Signed)
KIMMI, ACOCELLA ([id number]) Visit Report for 08/07/2021 Abuse/Suicide Risk Screen Details Patient Name: Dorothy Mccoy, Dorothy Mccoy. Date of Service: 08/07/2021 10:15 AM Medical Record Number: [id number] Patient Account Number: [id number] Date of Birth/Sex: 14-May-1951 (70 y.o. F) Treating RN: Donnamarie Poag Primary Care Chanique Duca: SYSTEM, PCP Other Clinician: Referring Karinda Cabriales: Kurtis Bushman Treating Fields Oros/Extender: Skipper Cliche in Treatment: 0 Abuse/Suicide Risk Screen Items Answer ABUSE RISK SCREEN: Has anyone close to you tried to hurt or harm you recentlyo No Do you feel uncomfortable with anyone in your familyo No Has anyone forced you do things that you didnot want to doo No Electronic Signature(s) Signed: 08/07/2021 12:02:10 PM By: Donnamarie Poag Entered By: Donnamarie Poag on 08/07/2021 10:28:52 Dorothy Mccoy ([id number]) -------------------------------------------------------------------------------- Activities of Daily Living Details Patient Name: Dorothy Mccoy, Dorothy Mccoy. Date of Service: 08/07/2021 10:15 AM Medical Record Number: [id number] Patient Account Number: [id number] Date of Birth/Sex: 08/23/1951 (70 y.o. F) Treating RN: Donnamarie Poag Primary Care Kelwin Gibler: SYSTEM, PCP Other Clinician: Referring Ameya Kutz: Kurtis Bushman Treating Rande Dario/Extender: Skipper Cliche in Treatment: 0 Activities of Daily Living Items Answer Activities of Daily Living (Please select one for each item) Drive Automobile Completely Able Take Medications Completely Able Use Telephone Completely Able Care for Appearance Completely Able Use Toilet Completely Able Bath / Shower Completely Able Dress Self Completely Able Feed Self Completely Able Walk Completely Able Get In / Out Bed Completely Able Housework Completely Able Prepare Meals Completely Able Handle Money Completely Able Shop for Self Completely Able Electronic Signature(s) Signed: 08/07/2021 12:02:10 PM By: Donnamarie Poag Entered By: Donnamarie Poag on  08/07/2021 10:29:10 Dorothy Mccoy ([id number]) -------------------------------------------------------------------------------- Education Screening Details Patient Name: Dorothy Mccoy, Dorothy Mccoy. Date of Service: 08/07/2021 10:15 AM Medical Record Number: [id number] Patient Account Number: [id number] Date of Birth/Sex: 10/12/1951 (70 y.o. F) Treating RN: Donnamarie Poag Primary Care Mustapha Colson: SYSTEM, PCP Other Clinician: Referring Mellany Dinsmore: Kurtis Bushman Treating Lubertha Leite/Extender: Skipper Cliche in Treatment: 0 Primary Learner Assessed: Patient Learning Preferences/Education Level/Primary Language Learning Preference: Explanation Highest Education Level: College or Above Preferred Language: English Cognitive Barrier Language Barrier: No Translator Needed: No Memory Deficit: No Emotional Barrier: No Cultural/Religious Beliefs Affecting Medical Care: No Physical Barrier Impaired Vision: No Impaired Hearing: No Decreased Hand dexterity: No Knowledge/Comprehension Knowledge Level: High Comprehension Level: High Ability to understand written instructions: High Ability to understand verbal instructions: High Motivation Anxiety Level: Calm Cooperation: Cooperative Education Importance: Acknowledges Need Interest in Health Problems: Asks Questions Perception: Coherent Willingness to Engage in Self-Management High Activities: Readiness to Engage in Self-Management High Activities: Electronic Signature(s) Signed: 08/07/2021 12:02:10 PM By: Donnamarie Poag Entered ByDonnamarie Poag on 08/07/2021 10:30:12 Dorothy Mccoy ([id number]) -------------------------------------------------------------------------------- Fall Risk Assessment Details Patient Name: Dorothy Mccoy. Date of Service: 08/07/2021 10:15 AM Medical Record Number: [id number] Patient Account Number: [id number] Date of Birth/Sex: 30-Oct-1951 (70 y.o. F) Treating RN: Donnamarie Poag Primary Care Zameria Vogl: SYSTEM, PCP Other  Clinician: Referring Dael Howland: Kurtis Bushman Treating Shetara Launer/Extender: Skipper Cliche in Treatment: 0 Fall Risk Assessment Items Have you had 2 or more falls in the last 12 monthso 0 No Have you had any fall that resulted in injury in the last 12 monthso 0 No FALLS RISK SCREEN History of falling - immediate or within 3 months 25 Yes Secondary diagnosis (Do you have 2 or more medical diagnoseso) 0 No Ambulatory aid None/bed rest/wheelchair/nurse 0 Yes Crutches/cane/walker 15 Yes Furniture 0 No Intravenous therapy Access/Saline/Heparin Lock 0 No Gait/Transferring Normal/ bed rest/ wheelchair 0 No Weak (short steps with or without shuffle,  stooped but able to lift head while walking, may 10 Yes seek support from furniture) Impaired (short steps with shuffle, may have difficulty arising from chair, head down, impaired 0 No balance) Mental Status Oriented to own ability 0 Yes Electronic Signature(s) Signed: 08/07/2021 12:02:10 PM By: Donnamarie Poag Entered By: Donnamarie Poag on 08/07/2021 10:30:40 Dorothy Mccoy ([id number]) -------------------------------------------------------------------------------- Foot Assessment Details Patient Name: Dorothy Mccoy, Dorothy Mccoy. Date of Service: 08/07/2021 10:15 AM Medical Record Number: [id number] Patient Account Number: [id number] Date of Birth/Sex: 1951-03-15 (70 y.o. F) Treating RN: Donnamarie Poag Primary Care Presleigh Feldstein: SYSTEM, PCP Other Clinician: Referring Brace Welte: Kurtis Bushman Treating Kili Gracy/Extender: Skipper Cliche in Treatment: 0 Foot Assessment Items Site Locations + = Sensation present, - = Sensation absent, C = Callus, U = Ulcer R = Redness, W = Warmth, M = Maceration, PU = Pre-ulcerative lesion F = Fissure, S = Swelling, D = Dryness Assessment Right: Left: Other Deformity: No No Prior Foot Ulcer: No No Prior Amputation: No No Charcot Joint: No No Ambulatory Status: Ambulatory Without Help Gait: Steady Electronic  Signature(s) Signed: 08/07/2021 12:02:10 PM By: Donnamarie Poag Entered By: Donnamarie Poag on 08/07/2021 10:30:59 Dorothy Mccoy ([id number]) -------------------------------------------------------------------------------- Nutrition Risk Screening Details Patient Name: Dorothy Mccoy, Dorothy Mccoy. Date of Service: 08/07/2021 10:15 AM Medical Record Number: [id number] Patient Account Number: [id number] Date of Birth/Sex: [birth year] (70 y.o. F) Treating RN: Donnamarie Poag Primary Care Shavanna Furnari: SYSTEM, PCP Other Clinician: Referring Lekisha Mcghee: Kurtis Bushman Treating Orene Abbasi/Extender: Skipper Cliche in Treatment: 0 Height (in): 69 Weight (lbs): 175 Body Mass Index (BMI): 25.8 Nutrition Risk Screening Items Score Screening NUTRITION RISK SCREEN: I have an illness or condition that made me change the kind and/or amount of food I eat 0 No I eat fewer than two meals per day 0 No I eat few fruits and vegetables, or milk products 0 No I have three or more drinks of beer, liquor or wine almost every day 0 No I have tooth or mouth problems that make it hard for me to eat 0 No I don't always have enough money to buy the food I need 0 No I eat alone most of the time 1 Yes I take three or more different prescribed or over-the-counter drugs a day 0 No Without wanting to, I have lost or gained 10 pounds in the last six months 0 No I am not always physically able to shop, cook and/or feed myself 0 No Nutrition Protocols Good Risk Protocol 0 No interventions needed Moderate Risk Protocol High Risk Proctocol Risk Level: Good Risk Score: 1 Electronic Signature(s) Signed: 08/07/2021 12:02:10 PM By: Donnamarie Poag Entered ByDonnamarie Poag on 08/07/2021 10:30:51

## 2021-08-08 NOTE — Progress Notes (Signed)
Dorothy Mccoy, Dorothy Mccoy ([id number]) Visit Report for 08/07/2021 Chief Complaint Document Details Patient Name: Dorothy Mccoy, Dorothy Mccoy. Date of Service: 08/07/2021 10:15 AM Medical Record Number: [id number] Patient Account Number: [id number] Date of Birth/Sex: 1950/11/09 (70 y.o. F) Treating RN: Dorothy Mccoy Primary Care Provider: SYSTEM, PCP Other Clinician: Referring Provider: Kurtis Mccoy Treating Provider/Extender: Dorothy Mccoy in Treatment: 0 Information Obtained from: Patient Chief Complaint Sacral pressure ulcer Electronic Signature(s) Signed: 08/07/2021 11:01:02 AM By: Dorothy Keeler PA-C Entered By: Dorothy Mccoy on 08/07/2021 11:01:01 Dorothy Mccoy, Dorothy Mccoy ([id number]) -------------------------------------------------------------------------------- Debridement Details Patient Name: [name], [name]. Date of Service: 08/07/2021 10:15 AM Medical Record Number: [id number] Patient Account Number: [id number] Date of Birth/Sex: 05/04/1951 (70 y.o. F) Treating RN: Dorothy Mccoy Primary Care Provider: SYSTEM, PCP Other Clinician: Referring Provider: Kurtis Mccoy Treating Provider/Extender: Dorothy Mccoy in Treatment: 0 Debridement Performed for Wound #1 Midline,Superior Sacrum Assessment: Performed By: Physician Dorothy Sams., PA-C Debridement Type: Debridement Level of Consciousness (Pre- Awake and Alert procedure): Pre-procedure Verification/Time Out Yes - 11:04 Taken: Start Time: 11:04 Pain Control: Lidocaine Total Area Debrided (L x W): 0.8 (cm) x 0.2 (cm) = 0.16 (cm) Tissue and other material Viable, Non-Viable, Slough, Subcutaneous, Slough debrided: Level: Skin/Subcutaneous Tissue Debridement Description: Excisional Instrument: Curette Bleeding: Minimum Hemostasis Achieved: Pressure Response to Treatment: Procedure was tolerated well Level of Consciousness (Post- Awake and Alert procedure): Post Debridement Measurements of Total Wound Length: (cm) 0.8 Stage: Category/Stage  III Width: (cm) 0.2 Depth: (cm) 0.2 Volume: (cm) 0.025 Character of Wound/Ulcer Post Debridement: Improved Post Procedure Diagnosis Same as Pre-procedure Electronic Signature(s) Signed: 08/07/2021 12:02:10 PM By: Dorothy Mccoy Signed: 08/08/2021 9:28:39 AM By: Dorothy Keeler PA-C Entered By: Dorothy Mccoy on 08/07/2021 11:06:02 Dorothy Mccoy ([id number]) -------------------------------------------------------------------------------- HPI Details Patient Name: Dorothy Mccoy, Dorothy Mccoy. Date of Service: 08/07/2021 10:15 AM Medical Record Number: [id number] Patient Account Number: [id number] Date of Birth/Sex: 1951-10-04 (70 y.o. F) Treating RN: Dorothy Mccoy Primary Care Provider: SYSTEM, PCP Other Clinician: Referring Provider: Kurtis Mccoy Treating Provider/Extender: Dorothy Mccoy in Treatment: 0 History of Present Illness HPI Description: 08/07/2021 upon evaluation today patient's wound is actually located over the sacral region. This is a stage III pressure ulcer but fortunately does not appear to be too terrible is just in a very difficult spot very and very deep between the gluteal cleft. 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 It apparently was not a very good 1 and she did not get very good care. She was very frustrated with that. 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 She has a history of Parkinson's disease. She also has psoriatic arthritis. With that being said the sacral wound does seem to be getting better based on what we are seeing. Her knee replacement was August 17 of the left knee and she developed the wound about a week later. Currently she is getting around decently well. Electronic Signature(s) Signed: 08/08/2021 9:15:55 AM By: Dorothy Keeler  PA-C Entered By: Dorothy Mccoy on 08/08/2021 09:15:54 Dorothy Mccoy ([id number]) -------------------------------------------------------------------------------- Physical Exam Details Patient Name: Dorothy Mccoy, Dorothy Mccoy. Date of Service: 08/07/2021 10:15 AM Medical Record Number: [id number] Patient Account Number: [id number] Date of Birth/Sex: 08-29-1951 (70 y.o. F) Treating RN: Dorothy Mccoy Primary Care  Provider: SYSTEM, PCP Other Clinician: Referring Provider: Kurtis Mccoy Treating Provider/Extender: Dorothy Mccoy in Treatment: 0 Constitutional patient is hypertensive.. pulse regular and within target range for patient.Marland Kitchen respirations regular, non-labored and within target range for patient.Marland Kitchen temperature within target range for patient.. Well-nourished and well-hydrated in no acute distress. Eyes conjunctiva clear no eyelid edema noted. pupils equal round and reactive to light and accommodation. Ears, Nose, Mouth, and Throat no gross abnormality of ear auricles or external auditory canals. normal hearing noted during conversation. mucus membranes moist. Respiratory normal breathing without difficulty. Musculoskeletal Patient unable to walk without assistance. no significant deformity or arthritic changes, no loss or range of motion, no clubbing. Psychiatric this patient is able to make decisions and demonstrates good insight into disease process. Alert and Oriented x 3. pleasant and cooperative. Notes Upon inspection patient's wound bed actually showed signs of fairly good granulation epithelization at this point. There does not appear to be any signs of active infection which is great news and overall very pleased in that regard. With that being said I do feel like that she is doing better in regard to the sacral wound though I do believe that it is definitely getting take a little bit more time for her to get this completely healed but she can move around a little bit at this point which  is definitely good news. I think keeping pressure off of the region is going to be ideal. Electronic Signature(s) Signed: 08/08/2021 9:16:42 AM By: Dorothy Keeler PA-C Entered By: Dorothy Mccoy on 08/08/2021 09:16:42 Dorothy Mccoy, Dorothy Mccoy ([id number]) -------------------------------------------------------------------------------- Physician Orders Details Patient Name: Dorothy Mccoy, Dorothy Mccoy. Date of Service: 08/07/2021 10:15 AM Medical Record Number: [id number] Patient Account Number: [id number] Date of Birth/Sex: November 29, 1950 (70 y.o. F) Treating RN: Dorothy Mccoy Primary Care Provider: SYSTEM, PCP Other Clinician: Referring Provider: Kurtis Mccoy Treating Provider/Extender: Dorothy Mccoy in Treatment: 0 Verbal / Phone Orders: No Diagnosis Coding ICD-10 Coding Code Description L89.153 Pressure ulcer of sacral region, stage 3 Z96.652 Presence of left artificial knee joint G20 Parkinson's disease L40.59 Other psoriatic arthropathy Follow-up Appointments o Return Appointment in 1 week. - provider visit o Nurse Visit as needed - nurse visit on 08/11/21 [place] request due to former client post op o ADMIT to [facility] for wound care. May utilize formulary equivalent dressing for wound treatment orders unless otherwise specified. Home Health Nurse may visit PRN to address patientos wound care needs. o Scheduled days for dressing changes to be completed; exception, patient has scheduled wound care visit that day. o **Please direct any NON-WOUND related issues/requests for orders to patient's Primary Care Physician. **If current dressing causes regression in wound condition, may D/C ordered dressing product/s and apply Normal Saline Moist Dressing daily until next [place] or Other MD appointment. **Notify [facility] of regression in wound condition at [phone]. Bathing/ Shower/ Hygiene o May shower; gently cleanse wound  with antibacterial soap, rinse and pat dry prior to dressing wounds - keep dressing dry- change after bathing o No tub bath. Off-Loading o Gel wheelchair cushion - available on amazon o Turn and reposition every 2 hours - try to reposition to keep pressure off of wound Wound Treatment Wound #1 - Sacrum Wound Laterality: Midline, Superior Cleanser: [employer] Ancillary Kit - 15 Day Supply (DME) (Generic) Every Other Day/15 Days Discharge Instructions: Use supplies as instructed; Kit contains: (15) Saline Bullets; (15) 3x3 Gauze; 15 pr Gloves Cleanser: Wound Cleanser (  DME) (Generic) Every Other Day/15 Days Discharge Instructions: Wash your hands with soap and water. Remove old dressing, discard into plastic bag and place into trash. Cleanse the wound with Wound Cleanser prior to applying a clean dressing using gauze sponges, not tissues or cotton balls. Do not scrub or use excessive force. Pat dry using gauze sponges, not tissue or cotton balls. Primary Dressing: Hydrofera Blue Ready Transfer Foam, 2.5x2.5 (in/in) (DME) (Generic) Every Other Day/15 Days Discharge Instructions: cut to fit wound, then bolster a slightly larger piece on top, Apply Hydrofera Blue Ready to wound bed as directed Secondary Dressing: Gauze (DME) (Generic) Every Other Day/15 Days Discharge Instructions: Fold in half and bolster on top of HBlue to cover Secured With: 60M [employer]ima Surgical Tape, 2x2 (in/yd) (DME) (Generic) Every Other Day/15 Days Electronic Signature(s) Signed: 08/07/2021 12:02:10 PM By: Dorothy Mccoy Signed: 08/08/2021 9:28:39 AM By: Sarita Haver, Telesia M. ([id number]) Entered By: Dorothy Mccoy on 08/07/2021 11:57:17 Dorothy Mccoy ([id number]) -------------------------------------------------------------------------------- Problem List Details Patient Name: Dorothy Mccoy, Dorothy Mccoy. Date of Service: 08/07/2021 10:15 AM Medical Record Number: [id number] Patient Account Number: [id number] Date  of Birth/Sex: 1950-12-04 (70 y.o. F) Treating RN: Dorothy Mccoy Primary Care Provider: SYSTEM, PCP Other Clinician: Referring Provider: Kurtis Mccoy Treating Provider/Extender: Dorothy Mccoy in Treatment: 0 Active Problems ICD-10 Encounter Code Description Active Date MDM Diagnosis L89.153 Pressure ulcer of sacral region, stage 3 08/07/2021 No Yes Z96.652 Presence of left artificial knee joint 08/07/2021 No Yes G20 Parkinson's disease 08/07/2021 No Yes L40.59 Other psoriatic arthropathy 08/07/2021 No Yes Inactive Problems Resolved Problems Electronic Signature(s) Signed: 08/07/2021 11:00:44 AM By: Dorothy Keeler PA-C Entered By: Dorothy Mccoy on 08/07/2021 11:00:44 Dorothy Mccoy ([id number]) -------------------------------------------------------------------------------- Progress Note Details Patient Name: Dorothy Mccoy, Dorothy Mccoy. Date of Service: 08/07/2021 10:15 AM Medical Record Number: [id number] Patient Account Number: [id number] Date of Birth/Sex: [birth year] (70 y.o. F) Treating RN: Dorothy Mccoy Primary Care Provider: SYSTEM, PCP Other Clinician: Referring Provider: Kurtis Mccoy Treating Provider/Extender: Dorothy Mccoy in Treatment: 0 Subjective Chief Complaint Information obtained from Patient Sacral pressure ulcer History of Present Illness (HPI) 08/07/2021 upon evaluation today patient's wound is actually located over the sacral region. This is a stage III pressure ulcer but fortunately does not appear to be too terrible is just in a very difficult spot very and very deep between the gluteal cleft. 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 It apparently was not a very good 1 and she did not get very good care. She was very frustrated with that. 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 She has a history of Parkinson's disease. She also has psoriatic arthritis. With that being said the sacral wound does seem to be getting better based on what we are seeing. Her knee replacement was August 17 of the left knee and she developed the wound about a week later. Currently she is getting around decently well. Patient History Information obtained from Patient. Allergies Cipro Social History Former smoker - ended on 11/02/2010, Marital Status - Single, Alcohol Use - Rarely, Drug Use - No History, Caffeine Use - Rarely. Medical History Eyes Patient has history of Cataracts - hx surgery Oncologic Patient has history of Received  Chemotherapy, Received Radiation Review of Systems (ROS) Constitutional Symptoms (General Health) Denies complaints or symptoms of Fatigue, Fever, Chills, Marked Weight Change. Ear/Nose/Mouth/Throat Denies complaints or symptoms of Difficult clearing ears, Sinusitis. Hematologic/Lymphatic Denies complaints or symptoms of Bleeding / Clotting Disorders, Human Immunodeficiency Virus. Respiratory Denies complaints or symptoms of Chronic or frequent coughs, Shortness of Breath. Cardiovascular Complains or has symptoms of LE edema - LLL edema only post L TKA. Gastrointestinal Denies complaints or symptoms of Frequent diarrhea, Nausea, Vomiting. Endocrine Denies complaints or symptoms of Hepatitis, Thyroid disease, Polydypsia (Excessive Thirst). Genitourinary Denies complaints or symptoms of Kidney failure/ Dialysis, Incontinence/dribbling. Immunological Denies complaints or symptoms of Hives, Itching. Integumentary (Skin) Denies complaints or symptoms of Wounds, Bleeding or bruising tendency, Breakdown, Swelling. Musculoskeletal psoriatic arthritis Neurologic Denies complaints or symptoms of Numbness/parasthesias, Focal/Weakness. Oncologic 2000 breast cancer/L mastectomy and R lumpectomy Psychiatric Denies  complaints or symptoms of Anxiety, Claustrophobia. Dorothy Mccoy, Dorothy Mccoy. ([id number]) Objective Constitutional patient is hypertensive.. pulse regular and within target range for patient.Marland Kitchen respirations regular, non-labored and within target range for patient.Marland Kitchen temperature within target range for patient.. Well-nourished and well-hydrated in no acute distress. Vitals Time Taken: 10:20 AM, Height: 69 in, Source: Stated, Weight: 175 lbs, Source: Stated, BMI: 25.8, Temperature: 98.4 F, Pulse: 82 bpm, Respiratory Rate: 16 breaths/min, Blood Pressure: 145/73 mmHg. General Notes: stated just weighed at referring doctor and recent knee replacement Eyes conjunctiva clear no eyelid edema noted. pupils equal round and reactive to light and accommodation. Ears, Nose, Mouth, and Throat no gross abnormality of ear auricles or external auditory canals. normal hearing noted during conversation. mucus membranes moist. Respiratory normal breathing without difficulty. Musculoskeletal Patient unable to walk without assistance. no significant deformity or arthritic changes, no loss or range of motion, no clubbing. Psychiatric this patient is able to make decisions and demonstrates good insight into disease process. Alert and Oriented x 3. pleasant and cooperative. General Notes: Upon inspection patient's wound bed actually showed signs of fairly good granulation epithelization at this point. There does not appear to be any signs of active infection which is great news and overall very pleased in that regard. With that being said I do feel like that she is doing better in regard to the sacral wound though I do believe that it is definitely getting take a little bit more time for her to get this completely healed but she can move around a little bit at this point which is definitely good news. I think keeping pressure off of the region is going to be ideal. Integumentary (Hair, Skin) Wound #1 status is Open. Original  cause of wound was Pressure Injury. The date acquired was: 06/25/2021. The wound is located on the [place]. The wound measures 0.8cm length x 0.2cm width x 0.2cm depth; 0.126cm^2 area and 0.025cm^3 volume. There is Fat Layer (Subcutaneous Tissue) exposed. There is no tunneling or undermining noted. There is a medium amount of serous drainage noted. There is small (1-33%) pink granulation within the wound bed. There is a large (67-100%) amount of necrotic tissue within the wound bed. Assessment Active Problems ICD-10 Pressure ulcer of sacral region, stage 3 Presence of left artificial knee joint Parkinson's disease Other psoriatic arthropathy Procedures Wound #1 Pre-procedure diagnosis of Wound #1 is a Pressure Ulcer located on the [place] . There was a Excisional Skin/Subcutaneous Tissue Debridement with a total area of 0.16 sq cm performed by Dorothy Sams., PA-C. With the following instrument(s): Curette to remove Viable and Non-Viable tissue/material. Material removed  includes Subcutaneous Tissue and Slough and after achieving pain control using Lidocaine. A time out was conducted at 11:04, prior to the start of the procedure. A Minimum amount of bleeding was controlled with Pressure. The procedure was tolerated well. Post Debridement Measurements: 0.8cm length x 0.2cm width x 0.2cm depth; 0.025cm^3 volume. Post debridement Stage noted as Category/Stage III. Character of Wound/Ulcer Post Debridement is improved. Post procedure Diagnosis Wound #1: Same as Pre-Procedure Dorothy Mccoy, Dorothy Mccoy ([id number]) Plan Follow-up Appointments: Return Appointment in 1 week. - provider visit Nurse Visit as needed - nurse visit on 08/11/21 [place] request due to former client post op ADMIT to [facility] for wound care. May utilize formulary equivalent dressing for wound treatment orders unless otherwise specified. Home Health Nurse may  visit PRN to address patient s wound care needs. Scheduled days for dressing changes to be completed; exception, patient has scheduled wound care visit that day. **Please direct any NON-WOUND related issues/requests for orders to patient's Primary Care Physician. **If current dressing causes regression in wound condition, may D/C ordered dressing product/s and apply Normal Saline Moist Dressing daily until next [place] or Other MD appointment. **Notify [facility] of regression in wound condition at [phone]. Bathing/ Shower/ Hygiene: May shower; gently cleanse wound with antibacterial soap, rinse and pat dry prior to dressing wounds - keep dressing dry-change after bathing No tub bath. Off-Loading: Gel wheelchair cushion - available on amazon Turn and reposition every 2 hours - try to reposition to keep pressure off of wound WOUND #1: - Sacrum Wound Laterality: Midline, Superior Cleanser: Byram Ancillary Kit - 15 Day Supply (DME) (Generic) Every Other Day/15 Days Discharge Instructions: Use supplies as instructed; Kit contains: (15) Saline Bullets; (15) 3x3 Gauze; 15 pr Gloves Cleanser: Wound Cleanser (DME) (Generic) Every Other Day/15 Days Discharge Instructions: Wash your hands with soap and water. Remove old dressing, discard into plastic bag and place into trash. Cleanse the wound with Wound Cleanser prior to applying a clean dressing using gauze sponges, not tissues or cotton balls. Do not scrub or use excessive force. Pat dry using gauze sponges, not tissue or cotton balls. Primary Dressing: Hydrofera Blue Ready Transfer Foam, 2.5x2.5 (in/in) (DME) (Generic) Every Other Day/15 Days Discharge Instructions: cut to fit wound, then bolster a slightly larger piece on top, Apply Hydrofera Blue Ready to wound bed as directed Secondary Dressing: Gauze (DME) (Generic) Every Other Day/15 Days Discharge Instructions: Fold in half and bolster on top of HBlue to  cover Secured With: 33M [place] Surgical Tape, 2x2 (in/yd) (DME) (Generic) Every Other Day/15 Days 1. I would recommend currently that based on what we are seeing we go ahead and utilize treatment with Hydrofera Blue in the gluteal fold especially after clearing away the slough there is some depth here and I think the biggest thing is good to be getting the dressing into the wound bed. 2. I am also can recommend that we see about getting her set up for home health since she is not can be able to changes on her own I think having home health will definitely be beneficial for her. 3. I would also suggest that she continue to monitor for any signs of worsening or infection if anything changes she should let me know as soon as possible such as increased pain. Otherwise we will see where things stand in a week. We will see patient back for reevaluation in 1 week here in the  clinic. If anything worsens or changes patient will contact our office for additional recommendations. Electronic Signature(s) Signed: 08/08/2021 9:17:39 AM By: Dorothy Keeler PA-C Entered By: Dorothy Mccoy on 08/08/2021 09:17:39 NEALY, HICKMON ([id number]) -------------------------------------------------------------------------------- ROS/PFSH Details Patient Name: KASSIDY, DOCKENDORF. Date of Service: 08/07/2021 10:15 AM Medical Record Number: [id number] Patient Account Number: [id number] Date of Birth/Sex: [birth year] (70 y.o. F) Treating RN: Dorothy Mccoy Primary Care Provider: SYSTEM, PCP Other Clinician: Referring Provider: Kurtis Mccoy Treating Provider/Extender: Dorothy Mccoy in Treatment: 0 Information Obtained From Patient Constitutional Symptoms (General Health) Complaints and Symptoms: Negative for: Fatigue; Fever; Chills; Marked Weight Change Ear/Nose/Mouth/Throat Complaints and Symptoms: Negative for: Difficult clearing ears; Sinusitis Hematologic/Lymphatic Complaints and Symptoms: Negative  for: Bleeding / Clotting Disorders; Human Immunodeficiency Virus Respiratory Complaints and Symptoms: Negative for: Chronic or frequent coughs; Shortness of Breath Cardiovascular Complaints and Symptoms: Positive for: LE edema - LLL edema only post L TKA Gastrointestinal Complaints and Symptoms: Negative for: Frequent diarrhea; Nausea; Vomiting Endocrine Complaints and Symptoms: Negative for: Hepatitis; Thyroid disease; Polydypsia (Excessive Thirst) Genitourinary Complaints and Symptoms: Negative for: Kidney failure/ Dialysis; Incontinence/dribbling Immunological Complaints and Symptoms: Negative for: Hives; Itching Integumentary (Skin) Complaints and Symptoms: Negative for: Wounds; Bleeding or bruising tendency; Breakdown; Swelling Neurologic Complaints and Symptoms: Negative for: Numbness/parasthesias; Focal/Weakness GRACEN, RINGWALD ([id number]) Psychiatric Complaints and Symptoms: Negative for: Anxiety; Claustrophobia Eyes Medical History: Positive for: Cataracts - hx surgery Musculoskeletal Complaints and Symptoms: Review of System Notes: psoriatic arthritis Oncologic Complaints and Symptoms: Review of System Notes: 2000 breast cancer/L mastectomy and R lumpectomy Medical History: Positive for: Received Chemotherapy; Received Radiation HBO Extended History Items Eyes: Cataracts Immunizations Pneumococcal Vaccine: Received Pneumococcal Vaccination: Yes Received Pneumococcal Vaccination On or After 60th Birthday: Yes Implantable Devices None Family and Social History Former smoker - ended on 11/02/2010; Marital Status - Single; Alcohol Use: Rarely; Drug Use: No History; Caffeine Use: Rarely; Financial Concerns: No; Food, Clothing or Shelter Needs: No; Support System Lacking: No; Transportation Concerns: No Electronic Signature(s) Signed: 08/07/2021 12:02:10 PM By: Dorothy Mccoy Signed: 08/08/2021 9:28:39 AM By: Dorothy Keeler PA-C Entered By: Dorothy Mccoy on  08/07/2021 10:28:45 ENA, DEMARY ([id number]) -------------------------------------------------------------------------------- [facility] Details Patient Name: MARNAE, MADANI. Date of Service: 08/07/2021 Medical Record Number: [id number] Patient Account Number: [id number] Date of Birth/Sex: 1951-01-15 (70 y.o. F) Treating RN: Dorothy Mccoy Primary Care Provider: SYSTEM, PCP Other Clinician: Referring Provider: Kurtis Mccoy Treating Provider/Extender: Dorothy Mccoy in Treatment: 0 Diagnosis Coding ICD-10 Codes Code Description L89.153 Pressure ulcer of sacral region, stage 3 Z96.652 Presence of left artificial knee joint G20 Parkinson's disease L40.59 Other psoriatic arthropathy Facility Procedures CPT4 Code: [id number] Description: 99213 - WOUND CARE VISIT-LEV 3 EST PT Modifier: Quantity: 1 CPT4 Code: [id number] Description: 17001 - DEB SUBQ TISSUE 20 SQ CM/< Modifier: Quantity: 1 CPT4 Code: Description: ICD-10 Diagnosis Description L89.153 Pressure ulcer of sacral region, stage 3 Modifier: Quantity: Physician Procedures CPT4 Code: 7494496 Description: 75916 - WC PHYS LEVEL 4 - NEW PT Modifier: 25 Quantity: 1 CPT4 Code: Description: ICD-10 Diagnosis Description L89.153 Pressure ulcer of sacral region, 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

## 2021-08-11 ENCOUNTER — Ambulatory Visit: Payer: Medicare Other

## 2021-08-18 ENCOUNTER — Ambulatory Visit: Payer: Self-pay | Admitting: Physician Assistant

## 2021-08-18 ENCOUNTER — Encounter: Payer: Medicare Other | Admitting: Physician Assistant

## 2021-08-18 ENCOUNTER — Other Ambulatory Visit: Payer: Self-pay

## 2021-08-18 DIAGNOSIS — L89153 Pressure ulcer of sacral region, stage 3: Secondary | ICD-10-CM | POA: Diagnosis not present

## 2021-08-18 NOTE — Progress Notes (Signed)
JUSTIN, BUECHNER ([id number]) Visit Report for 08/18/2021 Arrival Information Details Patient Name: Dorothy Mccoy, Dorothy Mccoy. Date of Service: 08/18/2021 1:45 PM Medical Record Number: [id number] Patient Account Number: [id number] Date of Birth/Sex: 1950-12-02 (70 y.o. F) Treating RN: Cornell Barman Primary Care Jenafer Winterton: SYSTEM, PCP Other Clinician: Referring Samora Jernberg: Kurtis Bushman Treating Halona Amstutz/Extender: Skipper Cliche in Treatment: 1 Visit Information History Since Last Visit Added or deleted any medications: No Patient Arrived: Ambulatory Has Dressing in Place as Prescribed: Yes Arrival Time: 13:49 Pain Present Now: No Accompanied By: self Transfer Assistance: None Patient Identification Verified: Yes Secondary Verification Process Completed: Yes Electronic Signature(s) Signed: 08/18/2021 5:13:51 PM By: Gretta Cool, BSN, RN, CWS, Kim RN, BSN Entered By: Gretta Cool, BSN, RN, CWS, Kim on 08/18/2021 13:51:25 TELIAH, BUFFALO ([id number]) -------------------------------------------------------------------------------- Clinic Level of Care Assessment Details Patient Name: TARONDA, COMACHO. Date of Service: 08/18/2021 1:45 PM Medical Record Number: [id number] Patient Account Number: [id number] Date of Birth/Sex: May 26, 1951 (70 y.o. F) Treating RN: Cornell Barman Primary Care Jeffrie Stander: SYSTEM, PCP Other Clinician: Referring Betania Dizon: Kurtis Bushman Treating Chezney Huether/Extender: Skipper Cliche in Treatment: 1 Clinic Level of Care Assessment Items TOOL 4 Quantity Score []  - Use when only an EandM is performed on FOLLOW-UP visit 0 ASSESSMENTS - Nursing Assessment / Reassessment X - Reassessment of Co-morbidities (includes updates in patient status) 1 10 X- 1 5 Reassessment of Adherence to Treatment Plan ASSESSMENTS - Wound and Skin Assessment / Reassessment X - Simple Wound Assessment / Reassessment - one wound 1 5 []  - 0 Complex Wound Assessment / Reassessment - multiple wounds []  - 0 Dermatologic / Skin  Assessment (not related to wound area) ASSESSMENTS - Focused Assessment []  - Circumferential Edema Measurements - multi extremities 0 []  - 0 Nutritional Assessment / Counseling / Intervention []  - 0 Lower Extremity Assessment (monofilament, tuning fork, pulses) []  - 0 Peripheral Arterial Disease Assessment (using hand held doppler) ASSESSMENTS - Ostomy and/or Continence Assessment and Care []  - Incontinence Assessment and Management 0 []  - 0 Ostomy Care Assessment and Management (repouching, etc.) PROCESS - Coordination of Care X - Simple Patient / Family Education for ongoing care 1 15 []  - 0 Complex (extensive) Patient / Family Education for ongoing care []  - 0 Staff obtains Programmer, systems, Records, Test Results / Process Orders []  - 0 Staff telephones HHA, Nursing Homes / Clarify orders / etc []  - 0 Routine Transfer to another Facility (non-emergent condition) []  - 0 Routine Hospital Admission (non-emergent condition) []  - 0 New Admissions / Biomedical engineer / Ordering NPWT, Apligraf, etc. []  - 0 Emergency Hospital Admission (emergent condition) X- 1 10 Simple Discharge Coordination []  - 0 Complex (extensive) Discharge Coordination PROCESS - Special Needs []  - Pediatric / Minor Patient Management 0 []  - 0 Isolation Patient Management []  - 0 Hearing / Language / Visual special needs []  - 0 Assessment of Community assistance (transportation, D/C planning, etc.) []  - 0 Additional assistance / Altered mentation []  - 0 Support Surface(s) Assessment (bed, cushion, seat, etc.) INTERVENTIONS - Wound Cleansing / Measurement PRECILLA, PURNELL M. ([id number]) X- 1 5 Simple Wound Cleansing - one wound []  - 0 Complex Wound Cleansing - multiple wounds X- 1 5 Wound Imaging (photographs - any number of wounds) []  - 0 Wound Tracing (instead of photographs) X- 1 5 Simple Wound Measurement - one wound []  - 0 Complex Wound Measurement - multiple wounds INTERVENTIONS - Wound  Dressings X - Small Wound Dressing one or multiple wounds 1 10 []  - 0 Medium Wound Dressing  one or multiple wounds []  - 0 Large Wound Dressing one or multiple wounds []  - 0 Application of Medications - topical []  - 0 Application of Medications - injection INTERVENTIONS - Miscellaneous []  - External ear exam 0 []  - 0 Specimen Collection (cultures, biopsies, blood, body fluids, etc.) []  - 0 Specimen(s) / Culture(s) sent or taken to Lab for analysis []  - 0 Patient Transfer (multiple staff / Civil Service fast streamer / Similar devices) []  - 0 Simple Staple / Suture removal (25 or less) []  - 0 Complex Staple / Suture removal (26 or more) []  - 0 Hypo / Hyperglycemic Management (close monitor of Blood Glucose) []  - 0 Ankle / Brachial Index (ABI) - do not check if billed separately X- 1 5 Vital Signs Has the patient been seen at the hospital within the last three years: Yes Total Score: 75 Level Of Care: New/Established - Level 2 Electronic Signature(s) Signed: 08/18/2021 5:13:51 PM By: Gretta Cool, BSN, RN, CWS, Kim RN, BSN Entered By: Gretta Cool, BSN, RN, CWS, Kim on 08/18/2021 14:21:16 ANALLELY, ROSELL ([id number]) -------------------------------------------------------------------------------- Encounter Discharge Information Details Patient Name: GAILA, ENGEBRETSEN. Date of Service: 08/18/2021 1:45 PM Medical Record Number: [id number] Patient Account Number: [id number] Date of Birth/Sex: Feb 27, 1951 (70 y.o. F) Treating RN: Cornell Barman Primary Care Wyman Meschke: SYSTEM, PCP Other Clinician: Referring Lashaye Fisk: Kurtis Bushman Treating Ronnett Pullin/Extender: Skipper Cliche in Treatment: 1 Encounter Discharge Information Items Discharge Condition: Stable Ambulatory Status: Ambulatory Discharge Destination: Home Transportation: Private Auto Accompanied By: self Schedule Follow-up Appointment: Yes Clinical Summary of Care: Electronic Signature(s) Signed: 08/18/2021 5:13:51 PM By: Gretta Cool, BSN, RN, CWS, Kim RN,  BSN Entered By: Gretta Cool, BSN, RN, CWS, Kim on 08/18/2021 14:24:16 JOICE, NAZARIO ([id number]) -------------------------------------------------------------------------------- Lower Extremity Assessment Details Patient Name: CYANNA, NEACE. Date of Service: 08/18/2021 1:45 PM Medical Record Number: [id number] Patient Account Number: [id number] Date of Birth/Sex: [birth year] (70 y.o. F) Treating RN: Cornell Barman Primary Care Bradleigh Sonnen: SYSTEM, PCP Other Clinician: Referring Murry Diaz: Kurtis Bushman Treating Tytianna Greenley/Extender: Skipper Cliche in Treatment: 1 Electronic Signature(s) Signed: 08/18/2021 5:13:51 PM By: Gretta Cool, BSN, RN, CWS, Kim RN, BSN Entered By: Gretta Cool, BSN, RN, CWS, Kim on 08/18/2021 13:57:07 CAASI, GIGLIA ([id number]) -------------------------------------------------------------------------------- Multi Wound Chart Details Patient Name: MIRABEL, AHLGREN. Date of Service: 08/18/2021 1:45 PM Medical Record Number: [id number] Patient Account Number: [id number] Date of Birth/Sex: 06-10-1951 (70 y.o. F) Treating RN: Cornell Barman Primary Care Kashif Pooler: SYSTEM, PCP Other Clinician: Referring Raeley Gilmore: Kurtis Bushman Treating Sira Adsit/Extender: Skipper Cliche in Treatment: 1 Vital Signs Height(in): 69 Pulse(bpm): 74 Weight(lbs): 175 Blood Pressure(mmHg): 101/60 Body Mass Index(BMI): 26 Temperature(F): 97.8 Respiratory Rate(breaths/min): 18 Photos: [1:No Photos] [N/A:N/A] Wound Location: [1:Midline, Superior Sacrum] [N/A:N/A] Wounding Event: [1:Pressure Injury] [N/A:N/A] Primary Etiology: [1:Pressure Ulcer] [N/A:N/A] Comorbid History: [1:Cataracts, Received Chemotherapy, Received Radiation] [N/A:N/A] Date Acquired: [1:06/25/2021] [N/A:N/A] Weeks of Treatment: [1:1] [N/A:N/A] Wound Status: [1:Open] [N/A:N/A] Measurements L x W x D (cm) [1:0.4x0.2x0.1] [N/A:N/A] Area (cm) : [1:0.063] [N/A:N/A] Volume (cm) : [1:0.006] [N/A:N/A] % Reduction in Area: [1:50.00%] [N/A:N/A] %  Reduction in Volume: [1:76.00%] [N/A:N/A] Classification: [1:Category/Stage III] [N/A:N/A] Exudate Amount: [1:Medium] [N/A:N/A] Exudate Type: [1:Serous] [N/A:N/A] Exudate Color: [1:amber] [N/A:N/A] Granulation Amount: [1:Large (67-100%)] [N/A:N/A] Granulation Quality: [1:Pink] [N/A:N/A] Necrotic Amount: [1:Small (1-33%)] [N/A:N/A] Exposed Structures: [1:Fat Layer (Subcutaneous Tissue): Yes Fascia: No Tendon: No Muscle: No Joint: No Bone: No None] [N/A:N/A N/A] Treatment Notes Electronic Signature(s) Signed: 08/18/2021 5:13:51 PM By: Gretta Cool, BSN, RN, CWS, Kim RN, BSN Entered By: Gretta Cool, BSN, RN, CWS, Kim on 08/18/2021 14:16:11 Nin, Saje M. (  [id number]) -------------------------------------------------------------------------------- Multi-Disciplinary Care Plan Details Patient Name: MORRIGAN, WICKENS. Date of Service: 08/18/2021 1:45 PM Medical Record Number: [id number] Patient Account Number: [id number] Date of Birth/Sex: [birth year] (70 y.o. F) Treating RN: Cornell Barman Primary Care Jode Lippe: SYSTEM, PCP Other Clinician: Referring Reannon Candella: Kurtis Bushman Treating Sofija Antwi/Extender: Skipper Cliche in Treatment: 1 Active Inactive Wound/Skin Impairment Nursing Diagnoses: Impaired tissue integrity Knowledge deficit related to smoking impact on wound healing Knowledge deficit related to ulceration/compromised skin integrity Goals: Patient/caregiver will verbalize understanding of skin care regimen Date Initiated: 08/07/2021 Target Resolution Date: 08/16/2021 Goal Status: Active Ulcer/skin breakdown will have a volume reduction of 30% by week 4 Date Initiated: 08/07/2021 Target Resolution Date: 09/07/2021 Goal Status: Active Ulcer/skin breakdown will have a volume reduction of 50% by week 8 Date Initiated: 08/07/2021 Target Resolution Date: 10/07/2021 Goal Status: Active Ulcer/skin breakdown will have a volume reduction of 80% by week 12 Date Initiated: 08/07/2021 Target Resolution Date:  11/07/2021 Goal Status: Active Ulcer/skin breakdown will heal within 14 weeks Date Initiated: 08/07/2021 Target Resolution Date: 11/16/2021 Goal Status: Active Interventions: Assess patient/caregiver ability to obtain necessary supplies Assess patient/caregiver ability to perform ulcer/skin care regimen upon admission and as needed Assess ulceration(s) every visit Notes: Electronic Signature(s) Signed: 08/18/2021 5:13:51 PM By: Gretta Cool, BSN, RN, CWS, Kim RN, BSN Entered By: Gretta Cool, BSN, RN, CWS, Kim on 08/18/2021 14:15:01 ALICIANNA, LITCHFORD ([id number]) -------------------------------------------------------------------------------- Pain Assessment Details Patient Name: MARCELIA, PETERSEN. Date of Service: 08/18/2021 1:45 PM Medical Record Number: [id number] Patient Account Number: [id number] Date of Birth/Sex: Jan 11, 1951 (70 y.o. F) Treating RN: Cornell Barman Primary Care Cheron Pasquarelli: SYSTEM, PCP Other Clinician: Referring Alaisa Moffitt: Kurtis Bushman Treating Lonnell Chaput/Extender: Skipper Cliche in Treatment: 1 Active Problems Location of Pain Severity and Description of Pain Patient Has Paino No Site Locations Pain Management and Medication Current Pain Management: Notes PAtient denies pain at this time. Electronic Signature(s) Signed: 08/18/2021 5:13:51 PM By: Gretta Cool, BSN, RN, CWS, Kim RN, BSN Entered By: Gretta Cool, BSN, RN, CWS, Kim on 08/18/2021 13:52:12 TATIJANA, BIERLY ([id number]) -------------------------------------------------------------------------------- Wound Assessment Details Patient Name: SHELSIE, TIJERINO. Date of Service: 08/18/2021 1:45 PM Medical Record Number: [id number] Patient Account Number: [id number] Date of Birth/Sex: [birth year] (70 y.o. F) Treating RN: Cornell Barman Primary Care Katha Kuehne: SYSTEM, PCP Other Clinician: Referring Liticia Gasior: Kurtis Bushman Treating Sarah Baez/Extender: Skipper Cliche in Treatment: 1 Wound Status Wound Number: 1 Primary Etiology: Pressure Ulcer Wound  Location: Midline, Superior Sacrum Wound Status: Open Wounding Event: Pressure Injury Comorbid Cataracts, Received Chemotherapy, Received History: Radiation Date Acquired: 06/25/2021 Weeks Of Treatment: 1 Clustered Wound: No Photos Photo Uploaded By: Gretta Cool, BSN, RN, CWS, Kim on 08/18/2021 15:06:58 Wound Measurements Length: (cm) 0.4 Width: (cm) 0.2 Depth: (cm) 0.1 Area: (cm) 0.063 Volume: (cm) 0.006 % Reduction in Area: 50% % Reduction in Volume: 76% Epithelialization: None Tunneling: No Undermining: No Wound Description Classification: Category/Stage III Exudate Amount: Medium Exudate Type: Serous Exudate Color: amber Foul Odor After Cleansing: No Slough/Fibrino Yes Wound Bed Granulation Amount: Large (67-100%) Exposed Structure Granulation Quality: Pink Fascia Exposed: No Necrotic Amount: Small (1-33%) Fat Layer (Subcutaneous Tissue) Exposed: Yes Necrotic Quality: Adherent Slough Tendon Exposed: No Muscle Exposed: No Joint Exposed: No Bone Exposed: No Treatment Notes Wound #1 (Sacrum) Wound Laterality: Midline, Superior Cleanser Wound Cleanser Discharge Instruction: Wash your hands with soap and water. Remove old dressing, discard into plastic bag and place into trash. Cleanse the wound with Wound Cleanser prior to applying a clean dressing using gauze sponges, not tissues or cotton balls. Do not  scrub or use excessive force. Pat dry using gauze sponges, not tissue or cotton balls. LANAIYA, LANTRY ([id number]) Peri-Wound Care Topical Primary Dressing Hydrofera Blue Ready Transfer Foam, 2.5x2.5 (in/in) Discharge Instruction: cut to fit wound, then bolster a slightly larger piece on top, Apply Hydrofera Blue Ready to wound bed as directed Secondary Dressing Secured With Compression Wrap Compression Stockings Add-Ons Electronic Signature(s) Signed: 08/18/2021 5:13:51 PM By: Gretta Cool, BSN, RN, CWS, Kim RN, BSN Entered By: Gretta Cool, BSN, RN, CWS, Kim on 08/18/2021  13:56:42 MAKAELA, CANDO ([id number]) -------------------------------------------------------------------------------- [place] Details Patient Name: JEROLYN, FLENNIKEN. Date of Service: 08/18/2021 1:45 PM Medical Record Number: [id number] Patient Account Number: [id number] Date of Birth/Sex: 18-Oct-1951 (70 y.o. F) Treating RN: Cornell Barman Primary Care Ronel Rodeheaver: SYSTEM, PCP Other Clinician: Referring Diany Formosa: Kurtis Bushman Treating Read Bonelli/Extender: Skipper Cliche in Treatment: 1 Vital Signs Time Taken: 13:51 Temperature (F): 97.8 Height (in): 69 Pulse (bpm): 74 Weight (lbs): 175 Respiratory Rate (breaths/min): 18 Body Mass Index (BMI): 25.8 Blood Pressure (mmHg): 101/60 Reference Range: 80 - 120 mg / dl Electronic Signature(s) Signed: 08/18/2021 5:13:51 PM By: Gretta Cool, BSN, RN, CWS, Kim RN, BSN Entered By: Gretta Cool, BSN, RN, CWS, Kim on 08/18/2021 13:51:48

## 2021-08-18 NOTE — Progress Notes (Addendum)
Dorothy Mccoy ([id number]) Visit Report for 08/18/2021 Chief Complaint Document Details Patient Name: Dorothy Mccoy, Dorothy Mccoy. Date of Service: 08/18/2021 1:45 PM Medical Record Number: [id number] Patient Account Number: [id number] Date of Birth/Sex: 1951-06-26 (70 y.o. F) Treating RN: Donnamarie Poag Primary Care Provider: SYSTEM, PCP Other Clinician: Referring Provider: Kurtis Bushman Treating Provider/Extender: Skipper Cliche in Treatment: 1 Information Obtained from: Patient Chief Complaint Sacral pressure ulcer Electronic Signature(s) Signed: 08/18/2021 2:13:53 PM By: Worthy Keeler PA-C Entered By: Worthy Keeler on 08/18/2021 14:13:52 RUBEE, VEGA ([id number]) -------------------------------------------------------------------------------- HPI Details Patient Name: Dorothy Mccoy. Date of Service: 08/18/2021 1:45 PM Medical Record Number: [id number] Patient Account Number: [id number] Date of Birth/Sex: 04/18/1951 (70 y.o. F) Treating RN: Donnamarie Poag Primary Care Provider: SYSTEM, PCP Other Clinician: Referring Provider: Kurtis Bushman Treating Provider/Extender: Skipper Cliche in Treatment: 1 History of Present Illness HPI Description: 08/07/2021 upon evaluation today patient's wound is actually located over the sacral region. This is a stage III pressure ulcer but fortunately does not appear to be too terrible is just in a very difficult spot very and very deep between the gluteal cleft. 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 It apparently was not a very good 1 and she did not get very good care. She was very frustrated with that. 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 She has a history of Parkinson's disease. She also has psoriatic arthritis.  With that being said the sacral wound does seem to be getting better based on what we are seeing. Her knee replacement was August 17 of the left knee and she developed the wound about a week later. Currently she is getting around decently well. 08/18/2021 upon evaluation today patient appears to be doing well with regard to her wound. She has been tolerating the dressing changes without complication and the [facility] has been doing excellent for her. Fortunately I do not see any signs of active infection at this time which is great news and overall I think that we are moving in the appropriate direction. Electronic Signature(s) Signed: 08/18/2021 2:18:29 PM By: Worthy Keeler PA-C Entered By: Worthy Keeler on 08/18/2021 14:18:29 REXINE, GOWENS ([id number]) -------------------------------------------------------------------------------- Physical Exam Details Patient Name: Dorothy Mccoy. Date of Service: 08/18/2021 1:45 PM Medical Record Number: [id number] Patient Account Number: [id number] Date of Birth/Sex: [birth year] (70 y.o. F) Treating RN: Donnamarie Poag Primary Care Provider: SYSTEM, PCP Other Clinician: Referring Provider: Kurtis Bushman Treating Provider/Extender: Skipper Cliche in Treatment: 1 Constitutional Well-nourished and well-hydrated in no acute distress. Respiratory normal breathing without difficulty. Psychiatric this patient is able to make decisions and demonstrates good insight into disease process. Alert and Oriented x 3. pleasant and cooperative. Notes Patient's wound bed showed signs of good granulation epithelization at this point. Fortunately there does not appear to be any evidence of infection which is great and overall I am extremely pleased. I think that she is moving in the age-appropriate way with regard to the wound overall. Electronic Signature(s) Signed: 08/18/2021 2:19:07 PM By: Worthy Keeler PA-C Entered By: Worthy Keeler on 08/18/2021  14:19:06 CELE, MOTE ([id number]) -------------------------------------------------------------------------------- Physician Orders Details Patient Name: Dorothy Mccoy. Date of Service: 08/18/2021 1:45 PM Medical Record  Number: [id number] Patient Account Number: [id number] Date of Birth/Sex: 1951/05/18 (70 y.o. F) Treating RN: Cornell Barman Primary Care Provider: SYSTEM, PCP Other Clinician: Referring Provider: Kurtis Bushman Treating Provider/Extender: Skipper Cliche in Treatment: 1 Verbal / Phone Orders: No Diagnosis Coding ICD-10 Coding Code Description L89.153 Pressure ulcer of sacral region, stage 3 Z96.652 Presence of left artificial knee joint G20 Parkinson's disease L40.59 Other psoriatic arthropathy Follow-up Appointments o Return Appointment in 2 weeks. [place]: - ADVANCED-at request due to former client post op o ADMIT to [facility] for wound care. May utilize formulary equivalent dressing for wound treatment orders unless otherwise specified. Home Health Nurse may visit PRN to address patientos wound care needs. o Scheduled days for dressing changes to be completed; exception, patient has scheduled wound care visit that day. o **Please direct any NON-WOUND related issues/requests for orders to patient's Primary Care Physician. **If current dressing causes regression in wound condition, may D/C ordered dressing product/s and apply Normal Saline Moist Dressing daily until next [place] or Other MD appointment. **Notify [facility] of regression in wound condition at [phone]. Bathing/ Shower/ Hygiene o May shower; gently cleanse wound with antibacterial soap, rinse and pat dry prior to dressing wounds - keep dressing dry- change after bathing o No tub bath. Off-Loading o Turn and reposition every 2 hours - try to reposition to keep pressure off of wound Wound Treatment Wound #1 - Sacrum Wound  Laterality: Midline, Superior Cleanser: Wound Cleanser (Generic) Every Other Day/15 Days Discharge Instructions: Wash your hands with soap and water. Remove old dressing, discard into plastic bag and place into trash. Cleanse the wound with Wound Cleanser prior to applying a clean dressing using gauze sponges, not tissues or cotton balls. Do not scrub or use excessive force. Pat dry using gauze sponges, not tissue or cotton balls. Primary Dressing: Hydrofera Blue Ready Transfer Foam, 2.5x2.5 (in/in) (Generic) Every Other Day/15 Days Discharge Instructions: cut to fit wound, then bolster a slightly larger piece on top, Apply Hydrofera Blue Ready to wound bed as directed Electronic Signature(s) Signed: 08/18/2021 5:13:51 PM By: Gretta Cool, BSN, RN, CWS, Kim RN, BSN Signed: 08/18/2021 5:23:05 PM By: Worthy Keeler PA-C Entered By: Gretta Cool, BSN, RN, CWS, Kim on 08/18/2021 14:20:25 RENESMAY, NESBITT ([id number]) -------------------------------------------------------------------------------- Problem List Details Patient Name: GAYNEL, SCHAAFSMA. Date of Service: 08/18/2021 1:45 PM Medical Record Number: [id number] Patient Account Number: [id number] Date of Birth/Sex: 1951-03-31 (70 y.o. F) Treating RN: Donnamarie Poag Primary Care Provider: SYSTEM, PCP Other Clinician: Referring Provider: Kurtis Bushman Treating Provider/Extender: Skipper Cliche in Treatment: 1 Active Problems ICD-10 Encounter Code Description Active Date MDM Diagnosis L89.153 Pressure ulcer of sacral region, stage 3 08/07/2021 No Yes Z96.652 Presence of left artificial knee joint 08/07/2021 No Yes G20 Parkinson's disease 08/07/2021 No Yes L40.59 Other psoriatic arthropathy 08/07/2021 No Yes Inactive Problems Resolved Problems Electronic Signature(s) Signed: 08/18/2021 2:13:29 PM By: Worthy Keeler PA-C Entered By: Worthy Keeler on 08/18/2021 14:13:28 [name]  ([id number]) -------------------------------------------------------------------------------- Progress Note Details Patient Name: MERSEDES, ALBER. Date of Service: 08/18/2021 1:45 PM Medical Record Number: [id number] Patient Account Number: [id number] Date of Birth/Sex: 1950-11-25 (70 y.o. F) Treating RN: Donnamarie Poag Primary Care Provider: SYSTEM, PCP Other Clinician: Referring Provider: Kurtis Bushman Treating Provider/Extender: Skipper Cliche in Treatment: 1 Subjective Chief Complaint Information obtained from Patient Sacral pressure ulcer History of Present Illness (HPI) 08/07/2021 upon evaluation today patient's wound is actually located over the sacral  region. This is a stage III pressure ulcer but fortunately does not appear to be too terrible is just in a very difficult spot very and very deep between the gluteal cleft. 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 It apparently was not a very good 1 and she did not get very good care. She was very frustrated with that. 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 She has a history of Parkinson's disease. She also has psoriatic arthritis. With that being said the sacral wound does seem to be getting better based on what we are seeing. Her knee replacement was August 17 of the left knee and she developed the wound about a week later. Currently she is getting around decently well. 08/18/2021 upon evaluation today patient appears to be doing well with regard to her wound. She has been tolerating the dressing changes without complication and the [facility] has been doing excellent for her. Fortunately I do not see any signs of active infection at this time which is great news and overall I think that we are  moving in the appropriate direction. Objective Constitutional Well-nourished and well-hydrated in no acute distress. Vitals Time Taken: 1:51 PM, Height: 69 in, Weight: 175 lbs, BMI: 25.8, Temperature: 97.8 F, Pulse: 74 bpm, Respiratory Rate: 18 breaths/min, Blood Pressure: 101/60 mmHg. Respiratory normal breathing without difficulty. Psychiatric this patient is able to make decisions and demonstrates good insight into disease process. Alert and Oriented x 3. pleasant and cooperative. General Notes: Patient's wound bed showed signs of good granulation epithelization at this point. Fortunately there does not appear to be any evidence of infection which is great and overall I am extremely pleased. I think that she is moving in the age-appropriate way with regard to the wound overall. Integumentary (Hair, Skin) Wound #1 status is Open. Original cause of wound was Pressure Injury. The date acquired was: 06/25/2021. The wound has been in treatment 1 weeks. The wound is located on the [place]. The wound measures 0.4cm length x 0.2cm width x 0.1cm depth; 0.063cm^2 area and 0.006cm^3 volume. There is Fat Layer (Subcutaneous Tissue) exposed. There is no tunneling or undermining noted. There is a medium amount of serous drainage noted. There is large (67-100%) pink granulation within the wound bed. There is a small (1-33%) amount of necrotic tissue within the wound bed including Adherent Slough. Assessment Active Problems ICD-10 Pressure ulcer of sacral region, stage 3 Presence of left artificial knee joint KAVERI, PERRAS. ([id number]) Parkinson's disease Other psoriatic arthropathy Plan 1. Would recommend currently that we going continue with the wound care measures as before and the patient is in agreement the plan. This includes the use of the [facility] dressing which I think is doing a great job. 2. I am also can recommend that we have the patient continue to monitor for  any signs of infection. Obviously I think that right now her pain is better and the wound is better I see no major issues here. We will see patient back for reevaluation in 1 week here in the clinic. If anything worsens or changes patient will contact our office for additional recommendations. Electronic Signature(s) Signed: 08/18/2021 2:19:23 PM By: Worthy Keeler PA-C Entered By: Melburn Hake,  Kiwana Deblasi on 08/18/2021 14:19:22 TRICHA, RUGGIRELLO ([id number]) -------------------------------------------------------------------------------- SuperBill Details Patient Name: ANA, WOODROOF. Date of Service: 08/18/2021 Medical Record Number: [id number] Patient Account Number: [id number] Date of Birth/Sex: August 26, 1951 (70 y.o. F) Treating RN: Donnamarie Poag Primary Care Provider: SYSTEM, PCP Other Clinician: Referring Provider: Kurtis Bushman Treating Provider/Extender: Skipper Cliche in Treatment: 1 Diagnosis Coding ICD-10 Codes Code Description [phone] Pressure ulcer of sacral region, stage 3 Z96.652 Presence of left artificial knee joint G20 Parkinson's disease L40.59 Other psoriatic arthropathy Facility Procedures CPT4 Code: 20919802 Description: [phone] - WOUND CARE VISIT-LEV 2 EST PT Modifier: Quantity: 1 Physician Procedures CPT4 Code: 1025486 Description: 28241 - WC PHYS LEVEL 3 - EST PT Modifier: Quantity: 1 CPT4 Code: Description: ICD-10 Diagnosis Description L89.153 Pressure ulcer of sacral region, stage 3 Z96.652 Presence of left artificial knee joint G20 Parkinson's disease L40.59 Other psoriatic arthropathy Modifier: Quantity: Electronic Signature(s) Signed: 08/18/2021 5:13:51 PM By: Gretta Cool, BSN, RN, CWS, Kim RN, BSN Signed: 08/18/2021 5:23:05 PM By: Worthy Keeler PA-C Previous Signature: 08/18/2021 2:19:47 PM Version By: Worthy Keeler PA-C Entered By: Gretta Cool, BSN, RN, CWS, Kim on 08/18/2021 14:21:38

## 2021-09-01 ENCOUNTER — Encounter: Payer: Medicare Other | Admitting: Physician Assistant

## 2021-09-01 ENCOUNTER — Other Ambulatory Visit: Payer: Self-pay

## 2021-09-01 DIAGNOSIS — L89153 Pressure ulcer of sacral region, stage 3: Secondary | ICD-10-CM | POA: Diagnosis not present

## 2021-09-01 NOTE — Progress Notes (Signed)
BURLENE, MONTECALVO ([id number]) Visit Report for 09/01/2021 Arrival Information Details Patient Name: Dorothy Mccoy, Dorothy Mccoy. Date of Service: 09/01/2021 11:30 AM Medical Record Number: [id number] Patient Account Number: [id number] Date of Birth/Sex: [birth year] (70 y.o. F) Treating RN: Donnamarie Poag Primary Care Brentlee Delage: SYSTEM, PCP Other Clinician: Referring Javonni Macke: Kurtis Bushman Treating Constantino Starace/Extender: Skipper Cliche in Treatment: 3 Visit Information History Since Last Visit Added or deleted any medications: No Patient Arrived: Dorothy Mccoy Had a fall or experienced change in No Arrival Time: 12:01 activities of daily living that may affect Accompanied By: self risk of falls: Transfer Assistance: None Hospitalized since last visit: No Has Dressing in Place as Prescribed: Yes Pain Present Now: Yes Electronic Signature(s) Signed: 09/01/2021 1:57:02 PM By: Donnamarie Poag Entered By: Donnamarie Poag on 09/01/2021 12:04:20 Dorothy Mccoy ([id number]) -------------------------------------------------------------------------------- Clinic Level of Care Assessment Details Patient Name: Dorothy Mccoy. Date of Service: 09/01/2021 11:30 AM Medical Record Number: [id number] Patient Account Number: [id number] Date of Birth/Sex: Jun 15, 1951 (70 y.o. F) Treating RN: Donnamarie Poag Primary Care Mekaila Tarnow: SYSTEM, PCP Other Clinician: Referring Darnelle Derrick: Kurtis Bushman Treating Vesna Kable/Extender: Skipper Cliche in Treatment: 3 Clinic Level of Care Assessment Items TOOL 4 Quantity Score _0  - Use when only an EandM is performed on FOLLOW-UP visit 0 ASSESSMENTS - Nursing Assessment / Reassessment _1  - Reassessment of Co-morbidities (includes updates in patient status) 0 _2  - 0 Reassessment of Adherence to Treatment Plan ASSESSMENTS - Wound and Skin Assessment / Reassessment X - Simple Wound Assessment / Reassessment - one wound 1 5 _3  - 0 Complex Wound Assessment / Reassessment - multiple wounds _4  -  0 Dermatologic / Skin Assessment (not related to wound area) ASSESSMENTS - Focused Assessment _5  - Circumferential Edema Measurements - multi extremities 0 _6  - 0 Nutritional Assessment / Counseling / Intervention _7  - 0 Lower Extremity Assessment (monofilament, tuning fork, pulses) _8  - 0 Peripheral Arterial Disease Assessment (using hand held doppler) ASSESSMENTS - Ostomy and/or Continence Assessment and Care _9  - Incontinence Assessment and Management 0 _10  - 0 Ostomy Care Assessment and Management (repouching, etc.) PROCESS - Coordination of Care X - Simple Patient / Family Education for ongoing care 1 15 _11  - 0 Complex (extensive) Patient / Family Education for ongoing care X- 1 10 Staff obtains Programmer, systems, Records, Test Results / Process Orders X- 1 10 Staff telephones HHA, Nursing Homes / Clarify orders / etc _12  - 0 Routine Transfer to another Facility (non-emergent condition) _13  - 0 Routine Hospital Admission (non-emergent condition) _14  - 0 New Admissions / Biomedical engineer / Ordering NPWT, Apligraf, etc. _15  - 0 Emergency Hospital Admission (emergent condition) X- 1 10 Simple Discharge Coordination _16  - 0 Complex (extensive) Discharge Coordination PROCESS - Special Needs _17  - Pediatric / Minor Patient Management 0 _18  - 0 Isolation Patient Management _19  - 0 Hearing / Language / Visual special needs _20  - 0 Assessment of Community assistance (transportation, D/C planning, etc.) _21  - 0 Additional assistance / Altered mentation _22  - 0 Support Surface(s) Assessment (bed, cushion, seat, etc.) INTERVENTIONS - Wound Cleansing / Measurement Dorothy, GABLE Mccoy. ([id number]) X- 1 5 Simple Wound Cleansing - one wound _23  - 0 Complex Wound Cleansing - multiple wounds X- 1 5 Wound Imaging (photographs - any number of wounds) _24  - 0 Wound Tracing (instead of photographs) _25  - 0 Simple Wound Measurement - one wound _26  - 0 Complex Wound Measurement - multiple  wounds INTERVENTIONS - Wound Dressings X - Small Wound Dressing one or multiple wounds 1 10 _27  -  0 Medium Wound Dressing one or multiple wounds _0  - 0 Large Wound Dressing one or multiple wounds X- 1 5 Application of Medications - topical <LFYBOFBPZWCHENID>_7<\/OEUMPNTIRWERXVQM>_0  - 0 Application of Medications - injection INTERVENTIONS - Miscellaneous _2  - External ear exam 0 _3  - 0 Specimen Collection (cultures, biopsies, blood, body fluids, etc.) _4  - 0 Specimen(s) / Culture(s) sent or taken to Lab for analysis _5  - 0 Patient Transfer (multiple staff / Harrel Lemon Lift / Similar devices) _6  - 0 Simple Staple / Suture removal (25 or less) _7  - 0 Complex Staple / Suture removal (26 or more) _8  - 0 Hypo / Hyperglycemic Management (close monitor of Blood Glucose) _9  - 0 Ankle / Brachial Index (ABI) - do not check if billed separately X- 1 5 Vital Signs Has the patient been seen at the hospital within the last three years: Yes Total Score: 80 Level Of Care: New/Established - Level 3 Electronic Signature(s) Signed: 09/01/2021 1:57:02 PM By: Donnamarie Poag Entered By: Donnamarie Poag on 09/01/2021 12:23:39 Dorothy Mccoy ([id number]) -------------------------------------------------------------------------------- Encounter Discharge Information Details Patient Name: JAEDAN, SCHUMAN. Date of Service: 09/01/2021 11:30 AM Medical Record Number: [id number] Patient Account Number: [id number] Date of Birth/Sex: 1951/04/19 (70 y.o. F) Treating RN: Donnamarie Poag Primary Care Terald Jump: SYSTEM, PCP Other Clinician: Referring Yohana Bartha: Kurtis Bushman Treating Khrystal Jeanmarie/Extender: Skipper Cliche in Treatment: 3 Encounter Discharge Information Items Discharge Condition: Stable Ambulatory Status: Walker Discharge Destination: Home Transportation: Private Auto Accompanied By: self Schedule Follow-up Appointment: Yes Clinical Summary of Care: Electronic Signature(s) Signed: 09/01/2021 1:57:02 PM By: Donnamarie Poag Entered By: Donnamarie Poag on  09/01/2021 12:24:23 Dorothy Mccoy ([id number]) -------------------------------------------------------------------------------- Lower Extremity Assessment Details Patient Name: ADELEINE, PASK. Date of Service: 09/01/2021 11:30 AM Medical Record Number: [id number] Patient Account Number: [id number] Date of Birth/Sex: October 25, 1951 (70 y.o. F) Treating RN: Donnamarie Poag Primary Care Devanie Galanti: SYSTEM, PCP Other Clinician: Referring Tedra Coppernoll: Kurtis Bushman Treating Nuvia Hileman/Extender: Skipper Cliche in Treatment: 3 Electronic Signature(s) Signed: 09/01/2021 1:57:02 PM By: Donnamarie Poag Entered By: Donnamarie Poag on 09/01/2021 12:10:26 Dorothy Mccoy ([id number]) -------------------------------------------------------------------------------- Multi Wound Chart Details Patient Name: EVERETTE, DIMAURO. Date of Service: 09/01/2021 11:30 AM Medical Record Number: [id number] Patient Account Number: [id number] Date of Birth/Sex: 1951/03/27 (70 y.o. F) Treating RN: Donnamarie Poag Primary Care Serrena Linderman: SYSTEM, PCP Other Clinician: Referring Jalaya Sarver: Kurtis Bushman Treating Kalecia Hartney/Extender: Skipper Cliche in Treatment: 3 Vital Signs Height(in): 69 Pulse(bpm): 80 Weight(lbs): 175 Blood Pressure(mmHg): 134/74 Body Mass Index(BMI): 26 Temperature(F): 98.0 Respiratory Rate(breaths/min): 16 Photos: [N/A:N/A] Wound Location: Midline, Superior Sacrum N/A N/A Wounding Event: Pressure Injury N/A N/A Primary Etiology: Pressure Ulcer N/A N/A Comorbid History: Cataracts, Received Chemotherapy, N/A N/A Received Radiation Date Acquired: 06/25/2021 N/A N/A Weeks of Treatment: 3 N/A N/A Wound Status: Open N/A N/A Measurements L x W x D (cm) 0.2x0.2x0.1 N/A N/A Area (cm) : 0.031 N/A N/A Volume (cm) : 0.003 N/A N/A % Reduction in Area: 75.40% N/A N/A % Reduction in Volume: 88.00% N/A N/A Classification: Category/Stage III N/A N/A Exudate Amount: Medium N/A N/A Exudate Type: Serous N/A N/A Exudate Color:  amber N/A N/A Granulation Amount: Large (67-100%) N/A N/A Granulation Quality: Pink N/A N/A Necrotic Amount: Small (1-33%) N/A N/A Exposed Structures: Fat Layer (Subcutaneous Tissue): N/A N/A Yes Fascia: No Tendon: No Muscle: No Joint: No Bone: No Epithelialization: None N/A N/A Treatment Notes Electronic Signature(s) Signed: 09/01/2021 1:57:02 PM By: Donnamarie Poag Entered By: Donnamarie Poag on 09/01/2021 12:11:18 Dorothy Mccoy ([id number]) -------------------------------------------------------------------------------- Multi-Disciplinary Care Plan Details Patient Name:  Arnaud, Elizzie Mccoy. Date of Service: 09/01/2021 11:30 AM Medical Record Number: [id number] Patient Account Number: [id number] Date of Birth/Sex: 1951/06/22 (70 y.o. F) Treating RN: Donnamarie Poag Primary Care Sandy Haye: SYSTEM, PCP Other Clinician: Referring Bryon Parker: Kurtis Bushman Treating Dinna Severs/Extender: Skipper Cliche in Treatment: 3 Active Inactive Wound/Skin Impairment Nursing Diagnoses: Impaired tissue integrity Knowledge deficit related to smoking impact on wound healing Knowledge deficit related to ulceration/compromised skin integrity Goals: Patient/caregiver will verbalize understanding of skin care regimen Date Initiated: 08/07/2021 Date Inactivated: 09/01/2021 Target Resolution Date: 08/16/2021 Goal Status: Met Ulcer/skin breakdown will have a volume reduction of 30% by week 4 Date Initiated: 08/07/2021 Target Resolution Date: 09/07/2021 Goal Status: Active Ulcer/skin breakdown will have a volume reduction of 50% by week 8 Date Initiated: 08/07/2021 Target Resolution Date: 10/07/2021 Goal Status: Active Ulcer/skin breakdown will have a volume reduction of 80% by week 12 Date Initiated: 08/07/2021 Target Resolution Date: 11/07/2021 Goal Status: Active Ulcer/skin breakdown will heal within 14 weeks Date Initiated: 08/07/2021 Target Resolution Date: 11/16/2021 Goal Status: Active Interventions: Assess  patient/caregiver ability to obtain necessary supplies Assess patient/caregiver ability to perform ulcer/skin care regimen upon admission and as needed Assess ulceration(s) every visit Notes: Electronic Signature(s) Signed: 09/01/2021 1:57:02 PM By: Donnamarie Poag Entered By: Donnamarie Poag on 09/01/2021 12:10:54 Dorothy Mccoy ([id number]) -------------------------------------------------------------------------------- Pain Assessment Details Patient Name: TY, OSHIMA. Date of Service: 09/01/2021 11:30 AM Medical Record Number: [id number] Patient Account Number: [id number] Date of Birth/Sex: Feb 16, 1951 (70 y.o. F) Treating RN: Donnamarie Poag Primary Care Fahima Cifelli: SYSTEM, PCP Other Clinician: Referring Deuntae Kocsis: Kurtis Bushman Treating Ascher Schroepfer/Extender: Skipper Cliche in Treatment: 3 Active Problems Location of Pain Severity and Description of Pain Patient Has Paino No Site Locations Rate the pain. Current Pain Level: 0 Pain Management and Medication Current Pain Management: Electronic Signature(s) Signed: 09/01/2021 1:57:02 PM By: Donnamarie Poag Entered By: Donnamarie Poag on 09/01/2021 12:09:25 Dorothy Mccoy ([id number]) -------------------------------------------------------------------------------- Patient/Caregiver Education Details Patient Name: LETASHA, KERSHAW. Date of Service: 09/01/2021 11:30 AM Medical Record Number: [id number] Patient Account Number: [id number] Date of Birth/Gender: 07-01-1951 (70 y.o. F) Treating RN: Donnamarie Poag Primary Care Physician: SYSTEM, PCP Other Clinician: Referring Physician: Kurtis Bushman Treating Physician/Extender: Skipper Cliche in Treatment: 3 Education Assessment Education Provided To: Patient Education Topics Provided Basic Hygiene: Wound/Skin Impairment: Electronic Signature(s) Signed: 09/01/2021 1:57:02 PM By: Donnamarie Poag Entered By: Donnamarie Poag on 09/01/2021 12:11:40 Dorothy Mccoy  ([id number]) -------------------------------------------------------------------------------- Wound Assessment Details Patient Name: JAWANA, REAGOR. Date of Service: 09/01/2021 11:30 AM Medical Record Number: [id number] Patient Account Number: [id number] Date of Birth/Sex: 11-10-1950 (70 y.o. F) Treating RN: Donnamarie Poag Primary Care Rhone Ozaki: SYSTEM, PCP Other Clinician: Referring Sadat Sliwa: Kurtis Bushman Treating Myia Bergh/Extender: Skipper Cliche in Treatment: 3 Wound Status Wound Number: 1 Primary Etiology: Pressure Ulcer Wound Location: Midline, Superior Sacrum Wound Status: Open Wounding Event: Pressure Injury Comorbid Cataracts, Received Chemotherapy, Received History: Radiation Date Acquired: 06/25/2021 Weeks Of Treatment: 3 Clustered Wound: No Photos Wound Measurements Length: (cm) 0.2 Width: (cm) 0.2 Depth: (cm) 0.1 Area: (cm) 0.031 Volume: (cm) 0.003 % Reduction in Area: 75.4% % Reduction in Volume: 88% Epithelialization: None Tunneling: No Undermining: No Wound Description Classification: Category/Stage III Exudate Amount: Medium Exudate Type: Serous Exudate Color: amber Foul Odor After Cleansing: No Slough/Fibrino Yes Wound Bed Granulation Amount: Large (67-100%) Exposed Structure Granulation Quality: Pink Fascia Exposed: No Necrotic Amount: Small (1-33%) Fat Layer (Subcutaneous Tissue) Exposed: Yes Necrotic Quality: Adherent Slough Tendon Exposed: No Muscle Exposed: No Joint Exposed: No Bone Exposed: No  Treatment Notes Wound #1 (Sacrum) Wound Laterality: Midline, Superior Cleanser Wound Cleanser Discharge Instruction: Wash your hands with soap and water. Remove old dressing, discard into plastic bag and place into trash. Cleanse the wound with Wound Cleanser prior to applying a clean dressing using gauze sponges, not tissues or cotton balls. Do not scrub or use excessive force. Pat dry using gauze sponges, not tissue or cotton balls. WYNETTA, SEITH ([id number]) Peri-Wound Care Topical Primary Dressing Hydrofera Blue Ready Transfer Foam, 2.5x2.5 (in/in) Discharge Instruction: cut to fit wound, then bolster a slightly larger piece on top, Apply Hydrofera Blue Ready to wound bed as directed Secondary Dressing Mepilex Border Flex, 4x4 (in/in) Discharge Instruction: Apply to wound as directed. Do not cut. Secured With Compression Wrap Compression Stockings Add-Ons Electronic Signature(s) Signed: 09/01/2021 1:57:02 PM By: Donnamarie Poag Entered By: Donnamarie Poag on 09/01/2021 12:10:11 ARILYNN, BLAKENEY ([id number]) -------------------------------------------------------------------------------- Vitals Details Patient Name: DESHAUNA, CAYSON. Date of Service: 09/01/2021 11:30 AM Medical Record Number: [id number] Patient Account Number: [id number] Date of Birth/Sex: 07/29/1951 (69 y.o. F) Treating RN: Donnamarie Poag Primary Care Chyna Kneece: SYSTEM, PCP Other Clinician: Referring Jovani Colquhoun: Kurtis Bushman Treating Kemyra August/Extender: Skipper Cliche in Treatment: 3 Vital Signs Time Taken: 12:07 Temperature (F): 98.0 Height (in): 69 Pulse (bpm): 80 Weight (lbs): 175 Respiratory Rate (breaths/min): 16 Body Mass Index (BMI): 25.8 Blood Pressure (mmHg): 134/74 Reference Range: 80 - 120 mg / dl Electronic Signature(s) Signed: 09/01/2021 1:57:02 PM By: Donnamarie Poag Entered ByDonnamarie Poag on 09/01/2021 12:08:59

## 2021-09-01 NOTE — Progress Notes (Addendum)
MALYNA, BUDNEY ([id number]) Visit Report for 09/01/2021 Chief Complaint Document Details Patient Name: Dorothy Mccoy, Dorothy Mccoy. Date of Service: 09/01/2021 11:30 AM Medical Record Number: [id number] Patient Account Number: [id number] Date of Birth/Sex: 1951-08-04 (70 y.o. F) Treating RN: Donnamarie Poag Primary Care Provider: SYSTEM, PCP Other Clinician: Referring Provider: Kurtis Bushman Treating Provider/Extender: Skipper Cliche in Treatment: 3 Information Obtained from: Patient Chief Complaint Sacral pressure ulcer Electronic Signature(s) Signed: 09/01/2021 12:16:46 PM By: Worthy Keeler PA-C Entered By: Worthy Keeler on 09/01/2021 12:16:45 Dorothy Mccoy, Dorothy Mccoy ([id number]) -------------------------------------------------------------------------------- HPI Details Patient Name: Dorothy, Mccoy. Date of Service: 09/01/2021 11:30 AM Medical Record Number: [id number] Patient Account Number: [id number] Date of Birth/Sex: [birth year] (70 y.o. F) Treating RN: Donnamarie Poag Primary Care Provider: SYSTEM, PCP Other Clinician: Referring Provider: Kurtis Bushman Treating Provider/Extender: Skipper Cliche in Treatment: 3 History of Present Illness HPI Description: 08/07/2021 upon evaluation today patient's wound is actually located over the sacral region. This is a stage III pressure ulcer but fortunately does not appear to be too terrible is just in a very difficult spot very and very deep between the gluteal cleft. 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 It apparently was not a very good 1 and she did not get very good care. She was very frustrated with that. 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 She has a history of Parkinson's disease. She also has psoriatic arthritis.  With that being said the sacral wound does seem to be getting better based on what we are seeing. Her knee replacement was August 17 of the left knee and she developed the wound about a week later. Currently she is getting around decently well. 08/18/2021 upon evaluation today patient appears to be doing well with regard to her wound. She has been tolerating the dressing changes without complication and the [facility] has been doing excellent for her. Fortunately I do not see any signs of active infection at this time which is great news and overall I think that we are moving in the appropriate direction. 09/01/2021 upon evaluation today patient appears to be doing pretty well in regard to her wound. Overall this seems to be making some good progress here which is great news. I do not see any signs of active infection systemically which is also great news. Electronic Signature(s) Signed: 09/01/2021 1:51:57 PM By: Worthy Keeler PA-C Entered By: Worthy Keeler on 09/01/2021 13:51:57 Dorothy Mccoy, Dorothy Mccoy ([id number]) -------------------------------------------------------------------------------- Physical Exam Details Patient Name: Dorothy, Mccoy. Date of Service: 09/01/2021 11:30 AM Medical Record Number: [id number] Patient Account Number: [id number] Date of Birth/Sex: 1951/02/05 (70 y.o. F) Treating RN: Donnamarie Poag Primary Care Provider: SYSTEM, PCP Other Clinician: Referring Provider: Kurtis Bushman Treating Provider/Extender: Skipper Cliche in Treatment: 3 Constitutional Well-nourished and well-hydrated in no acute distress. Respiratory normal breathing without difficulty. Psychiatric this patient is able to make decisions and demonstrates good insight into disease process. Alert and Oriented x 3. pleasant and cooperative. Notes Patient's wound bed actually showed signs of fairly good granulation epithelization at this point. Fortunately there does not appear to be any signs of active  infection systemically which is great news as well and overall I am extremely pleased with where things stand  currently. She has a very small area open in the sacral area but I think that is can be hard to [place] anything into this. Electronic Signature(s) Signed: 09/01/2021 1:52:20 PM By: Worthy Keeler PA-C Entered By: Worthy Keeler on 09/01/2021 13:52:20 Dorothy Mccoy, Dorothy Mccoy ([id number]) -------------------------------------------------------------------------------- Physician Orders Details Patient Name: Dorothy, Mccoy. Date of Service: 09/01/2021 11:30 AM Medical Record Number: [id number] Patient Account Number: [id number] Date of Birth/Sex: 1951/06/07 (70 y.o. F) Treating RN: Donnamarie Poag Primary Care Provider: SYSTEM, PCP Other Clinician: Referring Provider: Kurtis Bushman Treating Provider/Extender: Skipper Cliche in Treatment: 3 Verbal / Phone Orders: No Diagnosis Coding Follow-up Appointments o Return Appointment in 2 weeks. [place] for wound care. May utilize formulary equivalent dressing for wound treatment orders unless otherwise specified. Home Health Nurse may visit PRN to address patientos wound care needs. o Scheduled days for dressing changes to be completed; exception, patient has scheduled wound care visit that day. o **Please direct any NON-WOUND related issues/requests for orders to patient's Primary Care Physician. **If current dressing causes regression in wound condition, may D/C ordered dressing product/s and apply Normal Saline Moist Dressing daily until next [place] or Other MD appointment. **Notify [facility] of regression in wound condition at [phone]. Bathing/ Shower/ Hygiene o May shower; gently cleanse wound with antibacterial soap, rinse and pat dry prior to dressing wounds - keep dressing dry- change after bathing o No tub bath. Off-Loading o Turn  and reposition every 2 hours - try to reposition to keep pressure off of wound Additional Orders / Instructions o Follow Nutritious Diet and Increase Protein Intake Wound Treatment Wound #1 - Sacrum Wound Laterality: Midline, Superior Cleanser: Wound Cleanser (Generic) Every Other Day/15 Days Discharge Instructions: Wash your hands with soap and water. Remove old dressing, discard into plastic bag and place into trash. Cleanse the wound with Wound Cleanser prior to applying a clean dressing using gauze sponges, not tissues or cotton balls. Do not scrub or use excessive force. Pat dry using gauze sponges, not tissue or cotton balls. Primary Dressing: Hydrofera Blue Ready Transfer Foam, 2.5x2.5 (in/in) Every Other Day/15 Days Discharge Instructions: cut to fit wound, then bolster a slightly larger piece on top, Apply Hydrofera Blue Ready to wound bed as directed Secondary Dressing: Mepilex Border Flex, 4x4 (in/in) Every Other Day/15 Days Discharge Instructions: Apply to wound as directed. Do not cut. Electronic Signature(s) Signed: 09/01/2021 1:57:02 PM By: Donnamarie Poag Signed: 09/01/2021 3:58:49 PM By: Worthy Keeler PA-C Entered By: Donnamarie Poag on 09/01/2021 12:20:21 Dorothy Mccoy, Dorothy Mccoy ([id number]) -------------------------------------------------------------------------------- Problem List Details Patient Name: Dorothy Mccoy, Dorothy Mccoy. Date of Service: 09/01/2021 11:30 AM Medical Record Number: [id number] Patient Account Number: [id number] Date of Birth/Sex: August 31, 1951 (70 y.o. F) Treating RN: Donnamarie Poag Primary Care Provider: SYSTEM, PCP Other Clinician: Referring Provider: Kurtis Bushman Treating Provider/Extender: Skipper Cliche in Treatment: 3 Active Problems ICD-10 Encounter Code Description Active Date MDM Diagnosis L89.153 Pressure ulcer of sacral region, stage 3 08/07/2021 No Yes Z96.652 Presence of left artificial knee joint 08/07/2021 No Yes G20 Parkinson's disease 08/07/2021 No  Yes L40.59 Other psoriatic arthropathy 08/07/2021 No Yes Inactive Problems Resolved Problems Electronic Signature(s) Signed: 09/01/2021 12:16:39 PM By: Worthy Keeler PA-C Entered By: Worthy Keeler on 09/01/2021 12:16:39 Dorothy Mccoy ([id number]) -------------------------------------------------------------------------------- Progress Note Details Patient Name: Dorothy Mccoy, Dorothy Mccoy. Date of Service: 09/01/2021 11:30 AM Medical Record Number: [id number] Patient Account Number: [id number] Date of  Birth/Sex: 08-05-1951 (70 y.o. F) Treating RN: Donnamarie Poag Primary Care Provider: SYSTEM, PCP Other Clinician: Referring Provider: Kurtis Bushman Treating Provider/Extender: Skipper Cliche in Treatment: 3 Subjective Chief Complaint Information obtained from Patient Sacral pressure ulcer History of Present Illness (HPI) 08/07/2021 upon evaluation today patient's wound is actually located over the sacral region. This is a stage III pressure ulcer but fortunately does not appear to be too terrible is just in a very difficult spot very and very deep between the gluteal cleft. 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 It apparently was not a very good 1 and she did not get very good care. She was very frustrated with that. 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 She has a history of Parkinson's disease. She also has psoriatic arthritis. With that being said the sacral wound does seem to be getting better based on what we are seeing. Her knee replacement was August 17 of the left knee and she developed the wound about a week later. Currently she is getting around decently well. 08/18/2021 upon evaluation today patient appears to be doing well with regard to her wound. She  has been tolerating the dressing changes without complication and the [facility] has been doing excellent for her. Fortunately I do not see any signs of active infection at this time which is great news and overall I think that we are moving in the appropriate direction. 09/01/2021 upon evaluation today patient appears to be doing pretty well in regard to her wound. Overall this seems to be making some good progress here which is great news. I do not see any signs of active infection systemically which is also great news. Objective Constitutional Well-nourished and well-hydrated in no acute distress. Vitals Time Taken: 12:07 PM, Height: 69 in, Weight: 175 lbs, BMI: 25.8, Temperature: 98.0 F, Pulse: 80 bpm, Respiratory Rate: 16 breaths/min, Blood Pressure: 134/74 mmHg. Respiratory normal breathing without difficulty. Psychiatric this patient is able to make decisions and demonstrates good insight into disease process. Alert and Oriented x 3. pleasant and cooperative. General Notes: Patient's wound bed actually showed signs of fairly good granulation epithelization at this point. Fortunately there does not appear to be any signs of active infection systemically which is great news as well and overall I am extremely pleased with where things stand currently. She has a very small area open in the sacral area but I think that is can be hard to [place] anything into this. Integumentary (Hair, Skin) Wound #1 status is Open. Original cause of wound was Pressure Injury. The date acquired was: 06/25/2021. The wound has been in treatment 3 weeks. The wound is located on the [place]. The wound measures 0.2cm length x 0.2cm width x 0.1cm depth; 0.031cm^2 area and 0.003cm^3 volume. There is Fat Layer (Subcutaneous Tissue) exposed. There is no tunneling or undermining noted. There is a medium amount of serous drainage noted. There is large (67-100%) pink granulation within the wound bed.  There is a small (1-33%) amount of necrotic tissue within the wound bed including Adherent Slough. Assessment Active Problems Dorothy Mccoy, Dorothy Mccoy ([id number]) ICD-10 Pressure ulcer of sacral region, stage 3 Presence of left artificial knee joint Parkinson's disease Other psoriatic arthropathy Plan Follow-up Appointments:  Return Appointment in 2 weeks. Home Health: [place] for wound care. May utilize formulary equivalent dressing for wound treatment orders unless otherwise specified. Home Health Nurse may visit [facility] to address patient s wound care needs. Scheduled days for dressing changes to be completed; exception, patient has scheduled wound care visit that day. **Please direct any NON-WOUND related issues/requests for orders to patient's Primary Care Physician. **If current dressing causes regression in wound condition, may D/C ordered dressing product/s and apply Normal Saline Moist Dressing daily until next [place] or Other MD appointment. **Notify [facility] of regression in wound condition at [phone]. Bathing/ Shower/ Hygiene: May shower; gently cleanse wound with antibacterial soap, rinse and pat dry prior to dressing wounds - keep dressing dry-change after bathing No tub bath. Off-Loading: Turn and reposition every 2 hours - try to reposition to keep pressure off of wound Additional Orders / Instructions: Follow Nutritious Diet and Increase Protein Intake WOUND #1: - Sacrum Wound Laterality: Midline, Superior Cleanser: Wound Cleanser (Generic) Every Other Day/15 Days Discharge Instructions: Wash your hands with soap and water. Remove old dressing, discard into plastic bag and place into trash. Cleanse the wound with Wound Cleanser prior to applying a clean dressing using gauze sponges, not tissues or cotton balls. Do not scrub or use excessive force. Pat dry using gauze sponges, not tissue or cotton  balls. Primary Dressing: Hydrofera Blue Ready Transfer Foam, 2.5x2.5 (in/in) Every Other Day/15 Days Discharge Instructions: cut to fit wound, then bolster a slightly larger piece on top, Apply Hydrofera Blue Ready to wound bed as directed Secondary Dressing: Mepilex Border Flex, 4x4 (in/in) Every Other Day/15 Days Discharge Instructions: Apply to wound as directed. Do not cut. 1. I am good recommend that we go ahead and initiate treatment with continuation of the Hydrofera Blue followed by bordered foam dressing which I think is still the best way to go. 2. Also did recommend that we have the patient continue to monitor for any signs of worsening or infection. Obviously if there is any issues here they should let me know as soon as possible. Nonetheless so far I think she is doing quite well. We will see patient back for reevaluation in 2 weeks here in the clinic. If anything worsens or changes patient will contact our office for additional recommendations. Electronic Signature(s) Signed: 09/01/2021 1:53:07 PM By: Worthy Keeler PA-C Entered By: Worthy Keeler on 09/01/2021 13:53:07 Dorothy Mccoy, Dorothy Mccoy ([id number]) -------------------------------------------------------------------------------- SuperBill Details Patient Name: Dorothy Mccoy, Dorothy Mccoy. Date of Service: 09/01/2021 Medical Record Number: [id number] Patient Account Number: [id number] Date of Birth/Sex: [birth year] (70 y.o. F) Treating RN: Donnamarie Poag Primary Care Provider: SYSTEM, PCP Other Clinician: Referring Provider: Kurtis Bushman Treating Provider/Extender: Skipper Cliche in Treatment: 3 Diagnosis Coding ICD-10 Codes Code Description [phone] Pressure ulcer of sacral region, stage 3 Z96.652 Presence of left artificial knee joint G20 Parkinson's disease L40.59 Other psoriatic arthropathy Facility Procedures CPT4 Code: [id number] Description: 99213 - WOUND CARE VISIT-LEV 3 EST PT Modifier: Quantity: 1 Physician Procedures CPT4  Code: [id number] Description: 63149 - WC PHYS LEVEL 4 - EST PT Modifier: Quantity: 1 CPT4 Code: Description: ICD-10 Diagnosis Description L89.153 Pressure ulcer of sacral region, stage 3 Z96.652 Presence of left artificial knee joint G20 Parkinson's disease L40.59 Other psoriatic arthropathy Modifier: Quantity: Electronic Signature(s) Signed: 09/01/2021 1:53:50 PM By: Worthy Keeler PA-C Entered By: Worthy Keeler on 09/01/2021 13:53:50

## 2021-09-15 ENCOUNTER — Ambulatory Visit: Payer: Medicare Other | Admitting: Physician Assistant

## 2021-09-19 ENCOUNTER — Ambulatory Visit: Payer: Medicare Other | Admitting: Physician Assistant

## 2021-09-22 ENCOUNTER — Other Ambulatory Visit: Payer: Self-pay

## 2021-09-22 ENCOUNTER — Encounter: Payer: Medicare Other | Attending: Physician Assistant | Admitting: Physician Assistant

## 2021-09-22 DIAGNOSIS — Z872 Personal history of diseases of the skin and subcutaneous tissue: Secondary | ICD-10-CM | POA: Diagnosis not present

## 2021-09-22 DIAGNOSIS — G20 Parkinson's disease: Secondary | ICD-10-CM | POA: Insufficient documentation

## 2021-09-22 DIAGNOSIS — Z96652 Presence of left artificial knee joint: Secondary | ICD-10-CM | POA: Insufficient documentation

## 2021-09-22 DIAGNOSIS — L4059 Other psoriatic arthropathy: Secondary | ICD-10-CM | POA: Insufficient documentation

## 2021-09-22 DIAGNOSIS — Z09 Encounter for follow-up examination after completed treatment for conditions other than malignant neoplasm: Secondary | ICD-10-CM | POA: Insufficient documentation

## 2021-09-22 NOTE — Progress Notes (Addendum)
EMIRA, EUBANKS ([id number]) Visit Report for 09/22/2021 Arrival Information Details Patient Name: Dorothy Mccoy, Dorothy Mccoy. Date of Service: 09/22/2021 10:45 AM Medical Record Number: [id number] Patient Account Number: [id number] Date of Birth/Sex: 1951-01-26 (70 y.o. F) Treating RN: Donnamarie Poag Primary Care Curry Dulski: SYSTEM, PCP Other Clinician: Referring Coreyon Nicotra: Kurtis Bushman Treating Lorrie Gargan/Extender: Skipper Cliche in Treatment: 6 Visit Information History Since Last Visit Added or deleted any medications: No Patient Arrived: Gilford Rile Had a fall or experienced change in No Arrival Time: 10:48 activities of daily living that may affect Accompanied By: self risk of falls: Transfer Assistance: None Hospitalized since last visit: No Patient Identification Verified: Yes Has Dressing in Place as Prescribed: Yes Secondary Verification Process Completed: Yes Pain Present Now: No Electronic Signature(s) Signed: 09/22/2021 4:42:40 PM By: Donnamarie Poag Entered By: Donnamarie Poag on 09/22/2021 10:48:49 [name] ([id number]) -------------------------------------------------------------------------------- Clinic Level of Care Assessment Details Patient Name: Dorothy Mccoy, Dorothy Mccoy. Date of Service: 09/22/2021 10:45 AM Medical Record Number: [id number] Patient Account Number: [id number] Date of Birth/Sex: 1950/12/01 (70 y.o. F) Treating RN: Cornell Barman Primary Care Darran Gabay: SYSTEM, PCP Other Clinician: Referring Lemonte Al: Kurtis Bushman Treating Tashiana Lamarca/Extender: Skipper Cliche in Treatment: 6 Clinic Level of Care Assessment Items TOOL 4 Quantity Score []  - Use when only an EandM is performed on FOLLOW-UP visit 0 ASSESSMENTS - Nursing Assessment / Reassessment X - Reassessment of Co-morbidities (includes updates in patient status) 1 10 X- 1 5 Reassessment of Adherence to Treatment Plan ASSESSMENTS - Wound and Skin Assessment / Reassessment X - Simple Wound Assessment / Reassessment - one wound 1  5 []  - 0 Complex Wound Assessment / Reassessment - multiple wounds []  - 0 Dermatologic / Skin Assessment (not related to wound area) ASSESSMENTS - Focused Assessment []  - Circumferential Edema Measurements - multi extremities 0 []  - 0 Nutritional Assessment / Counseling / Intervention []  - 0 Lower Extremity Assessment (monofilament, tuning fork, pulses) []  - 0 Peripheral Arterial Disease Assessment (using hand held doppler) ASSESSMENTS - Ostomy and/or Continence Assessment and Care []  - Incontinence Assessment and Management 0 []  - 0 Ostomy Care Assessment and Management (repouching, etc.) PROCESS - Coordination of Care X - Simple Patient / Family Education for ongoing care 1 15 []  - 0 Complex (extensive) Patient / Family Education for ongoing care []  - 0 Staff obtains Programmer, systems, Records, Test Results / Process Orders []  - 0 Staff telephones HHA, Nursing Homes / Clarify orders / etc []  - 0 Routine Transfer to another Facility (non-emergent condition) []  - 0 Routine Hospital Admission (non-emergent condition) []  - 0 New Admissions / Biomedical engineer / Ordering NPWT, Apligraf, etc. []  - 0 Emergency Hospital Admission (emergent condition) X- 1 10 Simple Discharge Coordination []  - 0 Complex (extensive) Discharge Coordination PROCESS - Special Needs []  - Pediatric / Minor Patient Management 0 []  - 0 Isolation Patient Management []  - 0 Hearing / Language / Visual special needs []  - 0 Assessment of Community assistance (transportation, D/C planning, etc.) []  - 0 Additional assistance / Altered mentation []  - 0 Support Surface(s) Assessment (bed, cushion, seat, etc.) INTERVENTIONS - Wound Cleansing / Measurement Dorothy Mccoy, Dorothy M. ([id number]) X- 1 5 Simple Wound Cleansing - one wound []  - 0 Complex Wound Cleansing - multiple wounds X- 1 5 Wound Imaging (photographs - any number of wounds) []  - 0 Wound Tracing (instead of photographs) []  - 0 Simple Wound  Measurement - one wound []  - 0 Complex Wound Measurement - multiple wounds INTERVENTIONS - Wound Dressings []  -  Small Wound Dressing one or multiple wounds 0 []  - 0 Medium Wound Dressing one or multiple wounds []  - 0 Large Wound Dressing one or multiple wounds []  - 0 Application of Medications - topical []  - 0 Application of Medications - injection INTERVENTIONS - Miscellaneous []  - External ear exam 0 []  - 0 Specimen Collection (cultures, biopsies, blood, body fluids, etc.) []  - 0 Specimen(s) / Culture(s) sent or taken to Lab for analysis []  - 0 Patient Transfer (multiple staff / Civil Service fast streamer / Similar devices) []  - 0 Simple Staple / Suture removal (25 or less) []  - 0 Complex Staple / Suture removal (26 or more) []  - 0 Hypo / Hyperglycemic Management (close monitor of Blood Glucose) []  - 0 Ankle / Brachial Index (ABI) - do not check if billed separately X- 1 5 Vital Signs Has the patient been seen at the hospital within the last three years: Yes Total Score: 60 Level Of Care: New/Established - Level 2 Electronic Signature(s) Signed: 09/23/2021 4:45:38 PM By: Gretta Cool, BSN, RN, CWS, Kim RN, BSN Entered By: Gretta Cool, BSN, RN, CWS, Kim on 09/22/2021 11:13:07 Dorothy Mccoy, Dorothy Mccoy ([id number]) -------------------------------------------------------------------------------- Encounter Discharge Information Details Patient Name: Dorothy Mccoy, Dorothy Mccoy. Date of Service: 09/22/2021 10:45 AM Medical Record Number: [id number] Patient Account Number: [id number] Date of Birth/Sex: 1950-12-30 (70 y.o. F) Treating RN: Cornell Barman Primary Care Syesha Thaw: SYSTEM, PCP Other Clinician: Referring Jaydi Bray: Kurtis Bushman Treating Kimyatta Lecy/Extender: Skipper Cliche in Treatment: 6 Encounter Discharge Information Items Discharge Condition: Stable Ambulatory Status: Walker Discharge Destination: Home Transportation: Private Auto Schedule Follow-up Appointment: Yes Clinical Summary of Care: Electronic  Signature(s) Signed: 09/23/2021 4:45:38 PM By: Gretta Cool, BSN, RN, CWS, Kim RN, BSN Entered By: Gretta Cool, BSN, RN, CWS, Kim on 09/22/2021 11:14:49 [name] ([id number]) -------------------------------------------------------------------------------- Lower Extremity Assessment Details Patient Name: Dorothy Mccoy, Dorothy Mccoy. Date of Service: 09/22/2021 10:45 AM Medical Record Number: [id number] Patient Account Number: [id number] Date of Birth/Sex: [birth year] (70 y.o. F) Treating RN: Donnamarie Poag Primary Care Philana Younis: SYSTEM, PCP Other Clinician: Referring Devarious Pavek: Kurtis Bushman Treating Ytzel Gubler/Extender: Skipper Cliche in Treatment: 6 Electronic Signature(s) Signed: 09/22/2021 4:42:40 PM By: Donnamarie Poag Entered By: Donnamarie Poag on 09/22/2021 10:54:00 [name] ([id number]) -------------------------------------------------------------------------------- Multi Wound Chart Details Patient Name: Dorothy Mccoy, Dorothy Mccoy. Date of Service: 09/22/2021 10:45 AM Medical Record Number: [id number] Patient Account Number: [id number] Date of Birth/Sex: 06/05/1951 (70 y.o. F) Treating RN: Donnamarie Poag Primary Care Antoinett Dorman: SYSTEM, PCP Other Clinician: Referring Adaliz Dobis: Kurtis Bushman Treating Narya Beavin/Extender: Skipper Cliche in Treatment: 6 Vital Signs Height(in): 69 Pulse(bpm): 80 Weight(lbs): 175 Blood Pressure(mmHg): 166/91 Body Mass Index(BMI): 26 Temperature(F): 98.4 Respiratory Rate(breaths/min): 16 Photos: [N/A:N/A] Wound Location: Midline, Superior Sacrum N/A N/A Wounding Event: Pressure Injury N/A N/A Primary Etiology: Pressure Ulcer N/A N/A Comorbid History: Cataracts, Received Chemotherapy, N/A N/A Received Radiation Date Acquired: 06/25/2021 N/A N/A Weeks of Treatment: 6 N/A N/A Wound Status: Open N/A N/A Measurements L x W x D (cm) 0.1x0.1x0.1 N/A N/A Area (cm) : 0.008 N/A N/A Volume (cm) : 0.001 N/A N/A % Reduction in Area: 93.70% N/A N/A % Reduction in Volume: 96.00% N/A  N/A Classification: Category/Stage III N/A N/A Exudate Amount: None Present N/A N/A Granulation Amount: None Present (0%) N/A N/A Necrotic Amount: None Present (0%) N/A N/A Exposed Structures: Fascia: No N/A N/A Fat Layer (Subcutaneous Tissue): No Tendon: No Muscle: No Joint: No Bone: No Epithelialization: Large (67-100%) N/A N/A Treatment Notes Electronic Signature(s) Signed: 09/22/2021 4:42:40 PM By: Donnamarie Poag Entered ByDonnamarie Poag on 09/22/2021  10:54:27 KADE, RICKELS ([id number]) -------------------------------------------------------------------------------- [place] Details Patient Name: Dorothy Mccoy, Dorothy Mccoy. Date of Service: 09/22/2021 10:45 AM Medical Record Number: [id number] Patient Account Number: [id number] Date of Birth/Sex: 1951/09/26 (70 y.o. F) Treating RN: Donnamarie Poag Primary Care Veena Sturgess: SYSTEM, PCP Other Clinician: Referring Dwane Andres: Kurtis Bushman Treating Scharlene Catalina/Extender: Skipper Cliche in Treatment: 6 Active Inactive Electronic Signature(s) Signed: 09/22/2021 4:42:40 PM By: Donnamarie Poag Signed: 09/23/2021 4:45:38 PM By: Gretta Cool, BSN, RN, CWS, Kim RN, BSN Entered By: Gretta Cool, BSN, RN, CWS, Kim on 09/22/2021 11:09:34 MERVE, HOTARD ([id number]) -------------------------------------------------------------------------------- Pain Assessment Details Patient Name: Dorothy Mccoy, Dorothy Mccoy. Date of Service: 09/22/2021 10:45 AM Medical Record Number: [id number] Patient Account Number: [id number] Date of Birth/Sex: Mar 14, 1951 (70 y.o. F) Treating RN: Donnamarie Poag Primary Care Roi Jafari: SYSTEM, PCP Other Clinician: Referring Maanvi Lecompte: Kurtis Bushman Treating Siani Utke/Extender: Skipper Cliche in Treatment: 6 Active Problems Location of Pain Severity and Description of Pain Patient Has Paino No Site Locations Rate the pain. Current Pain Level: 0 Pain Management and Medication Current Pain Management: Electronic Signature(s) Signed: 09/22/2021  4:42:40 PM By: Donnamarie Poag Entered By: Donnamarie Poag on 09/22/2021 10:52:43 [name] ([id number]) -------------------------------------------------------------------------------- Patient/Caregiver Education Details Patient Name: Dorothy Mccoy, Dorothy Mccoy. Date of Service: 09/22/2021 10:45 AM Medical Record Number: [id number] Patient Account Number: [id number] Date of Birth/Gender: 1951-10-28 (70 y.o. F) Treating RN: Cornell Barman Primary Care Physician: SYSTEM, PCP Other Clinician: Referring Physician: Kurtis Bushman Treating Physician/Extender: Skipper Cliche in Treatment: 6 Education Assessment Education Provided To: Patient Education Topics Provided Wound/Skin Impairment: Handouts: Other: apply AandD ointment daily for 1 week. Methods: Explain/Verbal Responses: State content correctly Electronic Signature(s) Signed: 09/23/2021 4:45:38 PM By: Gretta Cool, BSN, RN, CWS, Kim RN, BSN Entered By: Gretta Cool, BSN, RN, CWS, Kim on 09/22/2021 11:13:46 Dorothy Mccoy, Dorothy Mccoy ([id number]) -------------------------------------------------------------------------------- Wound Assessment Details Patient Name: Dorothy Mccoy, VANDERLINDEN. Date of Service: 09/22/2021 10:45 AM Medical Record Number: [id number] Patient Account Number: [id number] Date of Birth/Sex: 1951/06/06 (70 y.o. F) Treating RN: Cornell Barman Primary Care Aylan Bayona: SYSTEM, PCP Other Clinician: Referring Keric Zehren: Kurtis Bushman Treating Arvetta Araque/Extender: Skipper Cliche in Treatment: 6 Wound Status Wound Number: 1 Primary Etiology: Pressure Ulcer Wound Location: Midline, Superior Sacrum Wound Status: Healed - Epithelialized Wounding Event: Pressure Injury Comorbid Cataracts, Received Chemotherapy, Received History: Radiation Date Acquired: 06/25/2021 Weeks Of Treatment: 6 Clustered Wound: No Photos Wound Measurements Length: (cm) 0 Width: (cm) 0 Depth: (cm) 0 Area: (cm) 0 Volume: (cm) 0 % Reduction in Area: 100% % Reduction in Volume:  100% Epithelialization: Large (67-100%) Tunneling: No Undermining: No Wound Description Classification: Category/Stage III Exudate Amount: None Present Foul Odor After Cleansing: No Slough/Fibrino No Wound Bed Granulation Amount: None Present (0%) Exposed Structure Necrotic Amount: None Present (0%) Fascia Exposed: No Fat Layer (Subcutaneous Tissue) Exposed: No Tendon Exposed: No Muscle Exposed: No Joint Exposed: No Bone Exposed: No Electronic Signature(s) Signed: 09/23/2021 4:45:38 PM By: Gretta Cool, BSN, RN, CWS, Kim RN, BSN Entered By: Gretta Cool, BSN, RN, CWS, Kim on 09/22/2021 11:09:24 ALEJANDRIA, WESSELLS ([id number]) -------------------------------------------------------------------------------- [place] Details Patient Name: ANNMARIE, PLEMMONS. Date of Service: 09/22/2021 10:45 AM Medical Record Number: [id number] Patient Account Number: [id number] Date of Birth/Sex: Sep 04, 1951 (70 y.o. F) Treating RN: Donnamarie Poag Primary Care Mykaela Arena: SYSTEM, PCP Other Clinician: Referring Breandan People: Kurtis Bushman Treating Aymar Whitfill/Extender: Skipper Cliche in Treatment: 6 Vital Signs Time Taken: 10:48 Temperature (F): 98.4 Height (in): 69 Pulse (bpm): 80 Weight (lbs): 175 Respiratory Rate (breaths/min): 16 Body Mass Index (BMI): 25.8 Blood Pressure (mmHg): 166/91 Reference Range: 80 -  120 mg / dl Electronic Signature(s) Signed: 09/22/2021 4:42:40 PM By: Donnamarie Poag Entered By: Donnamarie Poag on 09/22/2021 10:49:56

## 2021-09-22 NOTE — Progress Notes (Addendum)
Dorothy Mccoy ([id number]) Visit Report for 09/22/2021 Chief Complaint Document Details Patient Name: Dorothy Mccoy, Dorothy Mccoy. Date of Service: 09/22/2021 10:45 AM Medical Record Number: [id number] Patient Account Number: [id number] Date of Birth/Sex: [birth year] (70 y.o. F) Treating RN: Cornell Barman Primary Care Provider: SYSTEM, PCP Other Clinician: Referring Provider: Kurtis Bushman Treating Provider/Extender: Skipper Cliche in Treatment: 6 Information Obtained from: Patient Chief Complaint Sacral pressure ulcer Electronic Signature(s) Signed: 09/22/2021 10:46:14 AM By: Worthy Keeler PA-C Entered By: Worthy Keeler on 09/22/2021 10:46:13 Dorothy Mccoy, Dorothy Mccoy ([id number]) -------------------------------------------------------------------------------- HPI Details Patient Name: Dorothy Mccoy, Dorothy Mccoy. Date of Service: 09/22/2021 10:45 AM Medical Record Number: [id number] Patient Account Number: [id number] Date of Birth/Sex: 12-Jun-1951 (70 y.o. F) Treating RN: Cornell Barman Primary Care Provider: SYSTEM, PCP Other Clinician: Referring Provider: Kurtis Bushman Treating Provider/Extender: Skipper Cliche in Treatment: 6 History of Present Illness HPI Description: 08/07/2021 upon evaluation today patient's wound is actually located over the sacral region. This is a stage III pressure ulcer but fortunately does not appear to be too terrible is just in a very difficult spot very and very deep between the gluteal cleft. 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 It apparently was not a very good 1 and she did not get very good care. She was very frustrated with that. 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 She has a history of Parkinson's disease. She also has psoriatic arthritis.  With that being said the sacral wound does seem to be getting better based on what we are seeing. Her knee replacement was August 17 of the left knee and she developed the wound about a week later. Currently she is getting around decently well. 08/18/2021 upon evaluation today patient appears to be doing well with regard to her wound. She has been tolerating the dressing changes without complication and the [facility] has been doing excellent for her. Fortunately I do not see any signs of active infection at this time which is great news and overall I think that we are moving in the appropriate direction. 09/01/2021 upon evaluation today patient appears to be doing pretty well in regard to her wound. Overall this seems to be making some good progress here which is great news. I do not see any signs of active infection systemically which is also great news. 09/22/2021 upon evaluation today patient appears to be doing excellent with regard to her sacral wound. In fact this appears to be completely healed which is great news. I do not see any signs of infection which is also great news. Electronic Signature(s) Signed: 09/22/2021 11:09:58 AM By: Worthy Keeler PA-C Entered By: Worthy Keeler on 09/22/2021 11:09:57 Dorothy Mccoy ([id number]) -------------------------------------------------------------------------------- Physical Exam Details Patient Name: Dorothy Mccoy, Dorothy Mccoy. Date of Service: 09/22/2021 10:45 AM Medical Record Number: [id number] Patient Account Number: [id number] Date of Birth/Sex: 04-13-1951 (70 y.o. F) Treating RN: Cornell Barman Primary Care Provider: SYSTEM, PCP Other Clinician: Referring Provider: Kurtis Bushman Treating Provider/Extender: Skipper Cliche in Treatment: 6 Constitutional Well-nourished and well-hydrated in no acute distress. Respiratory normal breathing without difficulty. Psychiatric this patient is able to make decisions and demonstrates good insight into  disease process. Alert and Oriented x 3. pleasant and cooperative. Notes Patient's wound  bed showed signs of complete epithelization which is great news. I do believe this is completely healed though she still needs to baby the area so to speak to prevent it from breaking down I think a little AandD ointment would be beneficial in this regard just a small amount not too much caked on. Electronic Signature(s) Signed: 09/22/2021 11:10:31 AM By: Worthy Keeler PA-C Entered By: Worthy Keeler on 09/22/2021 11:10:31 Dorothy Mccoy ([id number]) -------------------------------------------------------------------------------- Physician Orders Details Patient Name: Dorothy Mccoy, Dorothy Mccoy. Date of Service: 09/22/2021 10:45 AM Medical Record Number: [id number] Patient Account Number: [id number] Date of Birth/Sex: January 21, 1951 (70 y.o. F) Treating RN: Cornell Barman Primary Care Provider: SYSTEM, PCP Other Clinician: Referring Provider: Kurtis Bushman Treating Provider/Extender: Skipper Cliche in Treatment: 6 Verbal / Phone Orders: No Diagnosis Coding ICD-10 Coding Code Description L89.153 Pressure ulcer of sacral region, stage 3 Z96.652 Presence of left artificial knee joint G20 Parkinson's disease L40.59 Other psoriatic arthropathy Discharge From [facility] Services o Discharge from [facility] Treatment Complete - Treatment complete. Apply AandD daily to area for 1 week. Electronic Signature(s) Signed: 09/22/2021 5:26:28 PM By: Worthy Keeler PA-C Signed: 09/23/2021 4:45:38 PM By: Gretta Cool, BSN, RN, CWS, Kim RN, BSN Entered By: Gretta Cool, BSN, RN, CWS, Kim on 09/22/2021 11:12:41 Dorothy Mccoy, Dorothy Mccoy ([id number]) -------------------------------------------------------------------------------- Problem List Details Patient Name: Dorothy Mccoy, Dorothy Mccoy. Date of Service: 09/22/2021 10:45 AM Medical Record Number: [id number] Patient Account Number: [id number] Date of Birth/Sex: 1951-04-19 (70 y.o. F) Treating RN: Cornell Barman Primary Care Provider: SYSTEM, PCP Other Clinician: Referring Provider: Kurtis Bushman Treating Provider/Extender: Skipper Cliche in Treatment: 6 Active Problems ICD-10 Encounter Code Description Active Date MDM Diagnosis L89.153 Pressure ulcer of sacral region, stage 3 08/07/2021 No Yes Z96.652 Presence of left artificial knee joint 08/07/2021 No Yes G20 Parkinson's disease 08/07/2021 No Yes L40.59 Other psoriatic arthropathy 08/07/2021 No Yes Inactive Problems Resolved Problems Electronic Signature(s) Signed: 09/22/2021 10:46:08 AM By: Worthy Keeler PA-C Entered By: Worthy Keeler on 09/22/2021 10:46:07 [name] ([id number]) -------------------------------------------------------------------------------- Progress Note Details Patient Name: Dorothy Mccoy, Dorothy Mccoy. Date of Service: 09/22/2021 10:45 AM Medical Record Number: [id number] Patient Account Number: [id number] Date of Birth/Sex: April 05, 1951 (70 y.o. F) Treating RN: Cornell Barman Primary Care Provider: SYSTEM, PCP Other Clinician: Referring Provider: Kurtis Bushman Treating Provider/Extender: Skipper Cliche in Treatment: 6 Subjective Chief Complaint Information obtained from Patient Sacral pressure ulcer History of Present Illness (HPI) 08/07/2021 upon evaluation today patient's wound is actually located over the sacral region. This is a stage III pressure ulcer but fortunately does not appear to be too terrible is just in a very difficult spot very and very deep between the gluteal cleft. 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 It apparently was not a very good 1 and she did not get very good care. She was very frustrated with that. 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 She has a  history of Parkinson's disease. She also has psoriatic arthritis. With that being said the sacral wound does seem to be getting better based on what we are seeing. Her knee replacement was August 17 of the left knee and she developed the wound about a week later. Currently she is getting around decently  well. 08/18/2021 upon evaluation today patient appears to be doing well with regard to her wound. She has been tolerating the dressing changes without complication and the [facility] has been doing excellent for her. Fortunately I do not see any signs of active infection at this time which is great news and overall I think that we are moving in the appropriate direction. 09/01/2021 upon evaluation today patient appears to be doing pretty well in regard to her wound. Overall this seems to be making some good progress here which is great news. I do not see any signs of active infection systemically which is also great news. 09/22/2021 upon evaluation today patient appears to be doing excellent with regard to her sacral wound. In fact this appears to be completely healed which is great news. I do not see any signs of infection which is also great news. Objective Constitutional Well-nourished and well-hydrated in no acute distress. Vitals Time Taken: 10:48 AM, Height: 69 in, Weight: 175 lbs, BMI: 25.8, Temperature: 98.4 F, Pulse: 80 bpm, Respiratory Rate: 16 breaths/min, Blood Pressure: 166/91 mmHg. Respiratory normal breathing without difficulty. Psychiatric this patient is able to make decisions and demonstrates good insight into disease process. Alert and Oriented x 3. pleasant and cooperative. General Notes: Patient's wound bed showed signs of complete epithelization which is great news. I do believe this is completely healed though she still needs to baby the area so to speak to prevent it from breaking down I think a little AandD ointment would be beneficial in this regard just a small  amount not too much caked on. Integumentary (Hair, Skin) Wound #1 status is Healed - Epithelialized. Original cause of wound was Pressure Injury. The date acquired was: 06/25/2021. The wound has been in treatment 6 weeks. The wound is located on the [place]. The wound measures 0cm length x 0cm width x 0cm depth; 0cm^2 area and 0cm^3 volume. There is no tunneling or undermining noted. There is a none present amount of drainage noted. There is no granulation within the wound bed. There is no necrotic tissue within the wound bed. Assessment Dorothy Mccoy, Dorothy Mccoy. ([id number]) Active Problems ICD-10 Pressure ulcer of sacral region, stage 3 Presence of left artificial knee joint Parkinson's disease Other psoriatic arthropathy Plan 1. Would recommend currently that we going to continue with the wound care measures as before with regard to offloading I think she needs to be very careful in that regard and monitor for any signs of worsening let us know if that happens but I do not expect anything. 2. I would recommend she apply a little bit of AandD ointment twice a day to help with preventing the skin from breaking down. We will see the patient back for follow-up visit as needed. Electronic Signature(s) Signed: 09/22/2021 11:11:05 AM By: Worthy Keeler PA-C Entered By: Worthy Keeler on 09/22/2021 11:11:05 Dorothy Mccoy, Dorothy Mccoy ([id number]) -------------------------------------------------------------------------------- SuperBill Details Patient Name: Dorothy Mccoy, Dorothy Mccoy. Date of Service: 09/22/2021 Medical Record Number: [id number] Patient Account Number: [id number] Date of Birth/Sex: 02-27-1951 (70 y.o. F) Treating RN: Cornell Barman Primary Care Provider: SYSTEM, PCP Other Clinician: Referring Provider: Kurtis Bushman Treating Provider/Extender: Skipper Cliche in Treatment: 6 Diagnosis Coding ICD-10 Codes Code Description [phone] Pressure ulcer of sacral region, stage 3 Z96.652 Presence of  left artificial knee joint G20 Parkinson's disease L40.59 Other psoriatic arthropathy Facility Procedures CPT4 Code: 01601093 Description: [phone] - WOUND CARE VISIT-LEV 2 EST PT Modifier: Quantity: 1 Physician Procedures CPT4 Code: 3220254 Description: [phone] -  WC PHYS LEVEL 3 - EST PT Modifier: Quantity: 1 CPT4 Code: Description: ICD-10 Diagnosis Description L89.153 Pressure ulcer of sacral region, stage 3 Z96.652 Presence of left artificial knee joint G20 Parkinson's disease L40.59 Other psoriatic arthropathy Modifier: Quantity: Electronic Signature(s) Signed: 09/22/2021 5:26:28 PM By: Worthy Keeler PA-C Signed: 09/23/2021 4:45:38 PM By: Gretta Cool BSN, RN, CWS, Kim RN, BSN Previous Signature: 09/22/2021 11:11:17 AM Version By: Worthy Keeler PA-C Entered By: Gretta Cool, BSN, RN, CWS, Kim on 09/22/2021 11:13:16

## 2021-10-13 ENCOUNTER — Ambulatory Visit: Payer: Medicare Other | Admitting: Surgery

## 2021-10-20 ENCOUNTER — Encounter: Payer: Self-pay | Admitting: Surgery

## 2021-10-20 ENCOUNTER — Other Ambulatory Visit: Payer: Self-pay

## 2021-10-20 ENCOUNTER — Ambulatory Visit (INDEPENDENT_AMBULATORY_CARE_PROVIDER_SITE_OTHER): Payer: Medicare Other | Admitting: Surgery

## 2021-10-20 VITALS — BP 130/76 | HR 74 | Temp 98.3°F | Ht 69.0 in | Wt 160.0 lb

## 2021-10-20 DIAGNOSIS — K4090 Unilateral inguinal hernia, without obstruction or gangrene, not specified as recurrent: Secondary | ICD-10-CM | POA: Diagnosis not present

## 2021-10-20 NOTE — Patient Instructions (Addendum)
We will schedule for a CT scan of the abdomen and pelvis with contrast to take a closer look at the area. We will call you about this scan.   We will have you follow up here in about 3 months.    Hernia, Adult   A hernia happens when an organ or tissue inside your body pushes out through a weak spot in the muscles of your belly (abdomen). This makes a bulge. The bulge may be: In a scar from a surgery that was done in your belly (incisional hernia). Near your belly button (umbilical hernia). In your groin (inguinal hernia). Your groin is the area where your leg meets your lower belly. If you are a female, this type could also be in your scrotum. In your upper thigh (femoral hernia). Inside your belly (hiatal hernia). This happens when your stomach slides above the muscle between your belly and your chest (diaphragm). What are the causes? This condition may be caused by: Lifting heavy things. Coughing over a long period of time. Having trouble pooping (constipation). Trouble pooping can lead to straining. A cut from surgery in your belly. A physical problem that is present at birth. Being very overweight. Smoking. Too much fluid in your belly. A testicle that has not moved down into the scrotum, in males. What are the signs or symptoms? The main symptom is a bulge in the area of the hernia, but a bulge may not always be seen. It may grow bigger or be easier to see when you cough or strain (such as when lifting something heavy). A hernia that can be pushed back into the belly rarely causes pain. A hernia that cannot be pushed back into the belly may lose its blood supply. This may cause: Pain. Fever. A feeling like you may vomit, and vomiting. Swelling. Trouble pooping. How is this treated? A hernia that is small and painless may not need to be treated. A hernia that is large or painful may be treated with surgery. Surgery to treat a hernia involves pushing the bulge back into place and  repairing the weak area of the muscle or belly. Follow these instructions at home: Activity Avoid straining the muscles near your hernia. This can happen when you: Lift something heavy. Poop (have a bowel movement). Do not lift anything that is heavier than 10 lb (4.5 kg), or the limit that you are told. When you lift something heavy, use your leg muscles. Do not use your back muscles to lift. Prevent trouble pooping If told by your doctor, take steps to prevent trouble pooping. You may need to: Drink enough fluid to keep your pee (urine) pale yellow. Take medicines. You will be told what medicines to take. Eat foods that are high in fiber. These include beans, whole grains, and fresh fruits and vegetables. Limit foods that are high in fat and sugar. These include fried or sweet foods. General instructions When you cough, try to cough gently. You may try to push your hernia back in by gently pressing on it when you are lying down. Do not try to force the bulge back in if it will not go in easily. If you are overweight, work with your doctor to lose weight safely. Do not smoke or use any products that contain nicotine or tobacco. If you need help quitting, ask your doctor. If you will be having surgery, watch your hernia for changes in shape, size, or color. Tell your doctor if you see any changes. Take  over-the-counter and prescription medicines only as told by your doctor. Keep all follow-up visits. Contact a doctor if: You get new pain, swelling, or redness near your hernia. You poop fewer times in a week than normal. You have trouble pooping. You have poop that is more dry than normal. You have poop that is harder or larger than normal. Get help right away if: You have a fever or chills. You have belly pain that gets worse. You feel like you may vomit, or you vomit. Your hernia cannot be pushed in by gently pressing on it when you are lying down. Your hernia: Changes in shape or  size. Changes color. Feels hard, or it hurts when you touch it. These symptoms may be an emergency. Get help right away. Call your local emergency services (911 in the [place] Do not wait to see if the symptoms will go away. Do not drive yourself to the hospital. Summary A hernia happens when an organ or tissue inside your body pushes out through a weak spot in the belly muscles. This creates a bulge. If your hernia is small and it does not hurt, you may not need treatment. If your hernia is large or it hurts, you may need surgery. If you will be having surgery, watch your hernia for changes in shape, size, or color. Tell your doctor about any changes. This information is not intended to replace advice given to you by your health care provider. Make sure you discuss any questions you have with your health care provider. Document Revised: 05/27/2020 Document Reviewed: 05/27/2020 Elsevier Patient Education  [place].

## 2021-10-21 ENCOUNTER — Other Ambulatory Visit: Payer: Self-pay | Admitting: Surgery

## 2021-10-21 ENCOUNTER — Telehealth: Payer: Self-pay

## 2021-10-21 ENCOUNTER — Other Ambulatory Visit: Payer: Self-pay

## 2021-10-21 DIAGNOSIS — K4090 Unilateral inguinal hernia, without obstruction or gangrene, not specified as recurrent: Secondary | ICD-10-CM

## 2021-10-21 DIAGNOSIS — R1084 Generalized abdominal pain: Secondary | ICD-10-CM

## 2021-10-21 NOTE — Telephone Encounter (Signed)
Called and spoke with the patient about her CT. Patient has been scheduled for a CT abdomen/pelvis with contrast at [facility] on 11/13/21. She will arrive at the [facility] at 7:30 am. She is to have nothing to eat for 4 hours prior. She will need to pick up prep kit. Patient verbalizes understanding.

## 2021-10-23 ENCOUNTER — Encounter: Payer: Self-pay | Admitting: Surgery

## 2021-10-23 NOTE — Progress Notes (Signed)
Patient ID: Dorothy Mccoy, female   DOB: 07-12-1951, 70 y.o.   MRN: [id number]  HPI Dorothy Mccoy is a 70 y.o. female seen in consultation request of Dr. Harlow Mares for a left inguinal hernia.  Of note she reports having a bulge and some mild intermittent sharp pains on the left inguinal region for the last few months.  She recently had a left knee replacement and now is regaining mobility.  She did well with her surgery.  Of note she did have a history of her prior right inguinal hernia repair 4 years ago by Griffin Dakin, MD .  Please note that I have personally reviewed the operative report date the appropriate surgical approach with ventrio mesh placement. She did have left knee replacement on August 2022 by Dr. Harlow Mares. Is recovering well.  She thinks she may need to have the right side done as well. Denies any chest pain or shortness of breath.  She is able to perform more than 4 METS of activity without any shortness of breath or chest pain.  Recent labs showed a white count of 3.4 and hemoglobin of 13 with normal platelet count.  BMP was normal with a creatinine of 2.54 albumin of 3.2. Did have a history of Bilateral breast cancer and mastectomy  HPI  Past Medical History:  Diagnosis Date   Breast cancer ([place])    bilateral   Osteoarthritis    Parkinson disease ([place])    Psoriatic arthritis ([place])     Past Surgical History:  Procedure Laterality Date   BREAST LUMPECTOMY Right 2005   INGUINAL HERNIA REPAIR Right 2018   MASTECTOMY Left 2000    No pertinent family history  Social History Social History   Tobacco Use   Smoking status: Former    Types: Cigarettes   Smokeless tobacco: Never  Vaping Use   Vaping Use: Never used  Substance Use Topics   Alcohol use: Yes   Drug use: Never    No Known Allergies  Current Outpatient Medications  Medication Sig Dispense Refill   carbidopa-levodopa (SINEMET IR) 25-100 MG tablet Take 2 tablets by mouth 4 (four) times daily.     HUMIRA  PEN 40 MG/0.4ML PNKT SMARTSIG:40 Milligram(s) SUB-Q Every 2 Weeks     methotrexate (RHEUMATREX) 2.5 MG tablet Take 25 mg by mouth once a week.     NEUPRO 8 MG/24HR PT24 Apply 1 patch topically daily.     rasagiline (AZILECT) 1 MG TABS tablet Take 0.5 tablets by mouth daily.     No current facility-administered medications for this visit.     Review of Systems Full ROS  was asked and was negative except for the information on the HPI  Physical Exam Blood pressure 130/76, pulse 74, temperature 98.3 F (36.8 C), height 5\' 9"  (1.753 m), weight 160 lb (72.6 kg), SpO2 96 %. CONSTITUTIONAL: NAD. EYES: Pupils are equal, round, Sclera are non-icteric. EARS, NOSE, MOUTH AND THROAT: She is wearing a mask hearing is intact to voice. LYMPH NODES:  Lymph nodes in the neck are normal. RESPIRATORY:  Lungs are clear. There is normal respiratory effort, with equal breath sounds bilaterally, and without pathologic use of accessory muscles. CARDIOVASCULAR: Heart is regular without murmurs, gallops, or rubs. GI: The abdomen is  soft, mild tenderness palpation in the left inguinal region with some  findings suggestive  for left inguinal hernia.Exam is limited due to body habitus.. There are no palpable masses. There is no hepatosplenomegaly. There are normal bowel sounds  in all quadrants. GU: Rectal deferred.   MUSCULOSKELETAL: Normal muscle strength and tone. No cyanosis or edema.   SKIN: Turgor is good and there are no pathologic skin lesions or ulcers. NEUROLOGIC: Motor and sensation is grossly normal. Cranial nerves are grossly intact. PSYCH:  Oriented to person, place and time. Affect is normal.  Data Reviewed  I have personally reviewed the patient's imaging, laboratory findings and medical records.    Assessment/Plan 70 year old female with history of left inguinal pain and findings consistent with left inguinal femoral hernia.  In order to delineate the anatomy I would like to obtain a CT scan of  the abdomen and pelvis.  She currently does not wish any surgical intervention and wishes to address the right knee first.  We discussed with her in detail about her disease process. Do think that she will benefit from repair and she will be a good candidate for robotic approach.  She understand that this is not urgent. We will see her in a few months after she completes the CT scan of the abdomen Please note that I spent at least 45 minutes's encounter including personally reviewing records, coordinating her care placing orders and performing appropriate documentation   Caroleen Hamman, MD FACS General Surgeon 10/23/2021, 2:20 PM

## 2021-11-13 ENCOUNTER — Telehealth: Payer: Self-pay

## 2021-11-13 ENCOUNTER — Ambulatory Visit
Admission: RE | Admit: 2021-11-13 | Discharge: 2021-11-13 | Disposition: A | Discharge status: Home / Self Care | Payer: Medicare Other | Source: Ambulatory Visit | Attending: Surgery | Admitting: Surgery

## 2021-11-13 DIAGNOSIS — R1084 Generalized abdominal pain: Secondary | ICD-10-CM | POA: Insufficient documentation

## 2021-11-13 DIAGNOSIS — K4090 Unilateral inguinal hernia, without obstruction or gangrene, not specified as recurrent: Secondary | ICD-10-CM | POA: Insufficient documentation

## 2021-11-13 LAB — POCT I-STAT CREATININE: Creatinine, Ser: 0.6 mg/dL (ref 0.44–1.00)

## 2021-11-13 MED ORDER — IOHEXOL 300 MG/ML  SOLN
100.0000 mL | Freq: Once | INTRAMUSCULAR | Status: AC | PRN
  Administered 2021-11-13: 100 mL via INTRAVENOUS

## 2021-11-13 NOTE — Telephone Encounter (Signed)
Spoke with patient regarding CT results and inguinal hernia and to keep 01/05/2022 appointment with Dr.Pabon.

## 2021-11-24 ENCOUNTER — Other Ambulatory Visit: Payer: Self-pay | Admitting: Family Medicine

## 2021-11-24 DIAGNOSIS — Z1231 Encounter for screening mammogram for malignant neoplasm of breast: Secondary | ICD-10-CM

## 2021-12-04 ENCOUNTER — Other Ambulatory Visit: Payer: Self-pay | Admitting: *Deleted

## 2021-12-04 ENCOUNTER — Inpatient Hospital Stay
Admission: RE | Admit: 2021-12-04 | Discharge: 2021-12-04 | Disposition: A | Discharge status: Home / Self Care | Payer: Self-pay | Source: Ambulatory Visit | Attending: *Deleted | Admitting: *Deleted

## 2021-12-04 DIAGNOSIS — Z1231 Encounter for screening mammogram for malignant neoplasm of breast: Secondary | ICD-10-CM

## 2022-01-05 ENCOUNTER — Ambulatory Visit: Payer: Medicare Other | Admitting: Surgery

## 2022-01-08 DIAGNOSIS — Z86010 Personal history of colonic polyps: Secondary | ICD-10-CM | POA: Insufficient documentation

## 2022-01-08 DIAGNOSIS — K5900 Constipation, unspecified: Secondary | ICD-10-CM | POA: Insufficient documentation

## 2022-01-26 ENCOUNTER — Other Ambulatory Visit: Payer: Self-pay

## 2022-01-26 ENCOUNTER — Encounter: Payer: Self-pay | Admitting: Surgery

## 2022-01-26 ENCOUNTER — Ambulatory Visit: Payer: Medicare Other | Admitting: Surgery

## 2022-01-26 DIAGNOSIS — C50919 Malignant neoplasm of unspecified site of unspecified female breast: Secondary | ICD-10-CM | POA: Insufficient documentation

## 2022-01-26 DIAGNOSIS — K219 Gastro-esophageal reflux disease without esophagitis: Secondary | ICD-10-CM | POA: Insufficient documentation

## 2022-01-26 DIAGNOSIS — R531 Weakness: Secondary | ICD-10-CM | POA: Insufficient documentation

## 2022-01-26 DIAGNOSIS — M2550 Pain in unspecified joint: Secondary | ICD-10-CM | POA: Insufficient documentation

## 2022-01-26 DIAGNOSIS — Z9889 Other specified postprocedural states: Secondary | ICD-10-CM | POA: Insufficient documentation

## 2022-01-26 DIAGNOSIS — Z9010 Acquired absence of unspecified breast and nipple: Secondary | ICD-10-CM | POA: Insufficient documentation

## 2022-01-26 DIAGNOSIS — I2699 Other pulmonary embolism without acute cor pulmonale: Secondary | ICD-10-CM | POA: Insufficient documentation

## 2022-01-26 DIAGNOSIS — R338 Other retention of urine: Secondary | ICD-10-CM | POA: Insufficient documentation

## 2022-01-26 DIAGNOSIS — Z86711 Personal history of pulmonary embolism: Secondary | ICD-10-CM | POA: Insufficient documentation

## 2022-01-26 NOTE — Patient Instructions (Signed)
Inguinal Hernia, Adult An inguinal hernia develops when fat or the intestines push through a weak spot in a muscle where the leg meets the lower abdomen (groin). This creates a bulge. This kind of hernia could also be: In the scrotum, if you are female. In folds of skin around the vagina, if you are female. There are three types of inguinal hernias: Hernias that can be pushed back into the abdomen (are reducible). This type rarely causes pain. Hernias that are not reducible (are incarcerated). Hernias that are not reducible and lose their blood supply (are strangulated). This type of hernia requires emergency surgery. What are the causes? This condition is caused by having a weak spot in the muscles or tissues in your groin. This develops over time. The hernia may poke through the weak spot when you suddenly strain your lower abdominal muscles, such as when you: Lift a heavy object. Strain to have a bowel movement. Constipation can lead to straining. Cough. What increases the risk? This condition is more likely to develop in: Males. Pregnant females. People who: Are overweight. Work in jobs that require long periods of standing or heavy lifting. Have had an inguinal hernia before. Smoke or have lung disease. These factors can lead to long-term (chronic) coughing. What are the signs or symptoms? Symptoms may depend on the size of the hernia. Often, a small inguinal hernia has no symptoms. Symptoms of a larger hernia may include: A bulge in the groin area. This is easier to see when standing. It might not be visible when lying down. Pain or burning in the groin. This may get worse when lifting, straining, or coughing. A dull ache or a feeling of pressure in the groin. An unusual bulge in the scrotum, in males. Symptoms of a strangulated inguinal hernia may include: A bulge in your groin that is very painful and tender to the touch. A bulge that turns red or purple. Fever, nausea, and  vomiting. Inability to have a bowel movement or to pass gas. How is this diagnosed? This condition is diagnosed based on your symptoms, your medical history, and a physical exam. Your health care provider may feel your groin area and ask you to cough. How is this treated? Treatment depends on the size of your hernia and whether you have symptoms. If you do not have symptoms, your health care provider may have you watch your hernia carefully and have you come in for follow-up visits. If your hernia is large or if you have symptoms, you may need surgery to repair the hernia. Follow these instructions at home: Lifestyle Avoid lifting heavy objects. Avoid standing for long periods of time. Do not use any products that contain nicotine or tobacco. These products include cigarettes, chewing tobacco, and vaping devices, such as e-cigarettes. If you need help quitting, ask your health care provider. Maintain a healthy weight. Preventing constipation You may need to take these actions to prevent or treat constipation: Drink enough fluid to keep your urine pale yellow. Take over-the-counter or prescription medicines. Eat foods that are high in fiber, such as beans, whole grains, and fresh fruits and vegetables. Limit foods that are high in fat and processed sugars, such as fried or sweet foods. General instructions You may try to push the hernia back in place by very gently pressing on it while lying down. Do not try to force the bulge back in if it will not push in easily. Watch your hernia for any changes in shape, size, or   color. Get help right away if you notice any changes. ?Take over-the-counter and prescription medicines only as told by your health care provider. ?Keep all follow-up visits. This is important. ?Contact a health care provider if: ?You have a fever or chills. ?You develop new symptoms. ?Your symptoms get worse. ?Get help right away if: ?You have pain in your groin that suddenly gets  worse. ?You have a bulge in your groin that: ?Suddenly gets bigger and does not get smaller. ?Becomes red or purple or painful to the touch. ?You are a man and you have a sudden pain in your scrotum, or the size of your scrotum suddenly changes. ?You cannot push the hernia back in place by very gently pressing on it when you are lying down. ?You have nausea or vomiting that does not go away. ?You have a fast heartbeat. ?You cannot have a bowel movement or pass gas. ?These symptoms may represent a serious problem that is an emergency. Do not wait to see if the symptoms will go away. Get medical help right away. Call your local emergency services (911 in the [place] ?Summary ?An inguinal hernia develops when fat or the intestines push through a weak spot in a muscle where your leg meets your lower abdomen (groin). ?This condition is caused by having a weak spot in muscles or tissues in your groin. ?Symptoms may depend on the size of the hernia, and they may include pain or swelling in your groin. A small inguinal hernia often has no symptoms. ?Treatment may not be needed if you do not have symptoms. If you have symptoms or a large hernia, you may need surgery to repair the hernia. ?Avoid lifting heavy objects. Also, avoid standing for long periods of time. ?This information is not intended to replace advice given to you by your health care provider. Make sure you discuss any questions you have with your health care provider. ?Document Revised: 06/18/2020 Document Reviewed: 06/18/2020 ?Elsevier Patient Education ? [place]. ? ?

## 2022-02-09 ENCOUNTER — Ambulatory Visit (INDEPENDENT_AMBULATORY_CARE_PROVIDER_SITE_OTHER): Payer: Medicare Other | Admitting: Surgery

## 2022-02-09 ENCOUNTER — Encounter: Payer: Self-pay | Admitting: Surgery

## 2022-02-09 VITALS — BP 145/78 | HR 65 | Temp 98.0°F | Ht 69.0 in | Wt 174.0 lb

## 2022-02-09 DIAGNOSIS — K4090 Unilateral inguinal hernia, without obstruction or gangrene, not specified as recurrent: Secondary | ICD-10-CM | POA: Diagnosis not present

## 2022-02-09 NOTE — Progress Notes (Signed)
Outpatient Surgical Follow Up ? ?02/09/2022 ? ?Dorothy Mccoy is an 71 y.o. female.  ? ?Chief Complaint  ?Patient presents with  ? Follow-up  ? ? ?HPI: Dorothy Mccoy is a 71 y.o. female seen in consultation request of Dr. Harlow Mares for a left inguinal hernia.  Of note she reports having a bulge and some mild intermittent sharp pains on the left inguinal region for the last few months.  She is recovering well from a left knee replacement and now is ready to have hernia surgery.  She did well with her surgery.  Of note she did have a history of her prior right inguinal hernia repair 4 years ago by Griffin Dakin, MD .  Please note that I have personally reviewed the operative report date the appropriate surgical approach with ventrio mesh placement. ?I have Also performed a CT of the abdomen pelvis personally reviewed showing evidence of left inguinal hernia and prior mesh. ?She did have left knee replacement on August 2022 by Dr. Harlow Mares.Denies any chest pain or shortness of breath.  She is able to perform more than 4 METS of activity without any shortness of breath or chest pain.  Recent labs showed a white count of 3.4 and hemoglobin of 13 with normal platelet count.  BMP was normal with a creatinine of 2.54 albumin of 3.2. ?Did have a history of Bilateral breast cancer and mastectomy ?  ? ?Past Medical History:  ?Diagnosis Date  ? Breast cancer ([place])   ? bilateral  ? Osteoarthritis   ? Parkinson disease ([place])   ? Psoriatic arthritis ([place])   ? ? ?Past Surgical History:  ?Procedure Laterality Date  ? BREAST LUMPECTOMY Right 2005  ? INGUINAL HERNIA REPAIR Right 2018  ? MASTECTOMY Left 2000  ? ? ?No family history on file. ? ?Social History:  reports that she has quit smoking. Her smoking use included cigarettes. She has never used smokeless tobacco. She reports current alcohol use. She reports that she does not use drugs. ? ?Allergies:  ?Allergies  ?Allergen Reactions  ? Ciprofloxacin Hcl Nausea Only  ? ? ?Medications  reviewed. ? ? ? ?ROS ?Full ROS performed and is otherwise negative other than what is stated in HPI ? ? ?BP (!) 145/78   Pulse 65   Temp 98 ?F (36.7 ?C)   Ht '5\' 9"'$  (1.753 m)   Wt 174 lb (78.9 kg)   SpO2 98%   BMI 25.70 kg/m?  ? ?Physical Exam ?Vitals and nursing note reviewed. Exam conducted with a chaperone present.  ?Constitutional:   ?   General: She is not in acute distress. ?   Appearance: Normal appearance.  ?Neck:  ?   Vascular: No carotid bruit.  ?Cardiovascular:  ?   Rate and Rhythm: Normal rate and regular rhythm.  ?   Heart sounds: No murmur heard. ?Pulmonary:  ?   Effort: Pulmonary effort is normal. No respiratory distress.  ?   Breath sounds: Normal breath sounds. No stridor. No wheezing or rhonchi.  ?Abdominal:  ?   General: Abdomen is flat. There is no distension.  ?   Palpations: Abdomen is soft. There is no mass.  ?   Tenderness: There is no abdominal tenderness. There is no guarding.  ?   Hernia: A hernia is present.  ?   Comments: LIH reducible  ?Musculoskeletal:     ?   General: No tenderness. Normal range of motion.  ?   Cervical back: Normal range of motion and  neck supple. No rigidity or tenderness.  ?Skin: ?   General: Skin is warm and dry.  ?   Capillary Refill: Capillary refill takes less than 2 seconds.  ?Neurological:  ?   General: No focal deficit present.  ?   Mental Status: She is alert and oriented to person, place, and time.  ?Psychiatric:     ?   Mood and Affect: Mood normal.     ?   Behavior: Behavior normal.     ?   Thought Content: Thought content normal.     ?   Judgment: Judgment normal.  ? ? ? ? ?Assessment/Plan: ?71 year old female with symptomatic left inguinal hernia.  Discussed with patient detail about my recommendation for repair.  I do think that she is a candidate for robotic repair.  She does have prior history of TRAM flap with mesh placement within the abdominal cavity.  The incision seems to be away from my proposed robotic approach.  Procedure discussed with  patient detail.  Risk, benefits and possible implications including but not limited to: Bleeding, bowel injuries, pain management issues.  She understands and wished to proceed.  Pacifically also discussed the possibility of bilateral repairs were they to be present at the time of operation ?Please note that I have spent 40 minutes in this encounter including reviewing medical records, imaging studies, coordinating his care, placing orders and performing appropriate documentation ? ?Caroleen Hamman, MD FACS ?General Surgeon  ?

## 2022-02-09 NOTE — Patient Instructions (Signed)
Our surgery scheduler Pamala Hurry will call you within 24-48 hours to get you scheduled. If you have not heard from her after 48 hours, please call our office. Have the blue sheet available when she calls to write down important information. ? ? ?If you have any concerns or questions, please feel free to call our office.  ? ? ?Laparoscopic Inguinal Hernia Repair, Adult ?Laparoscopic inguinal hernia repair is a surgical procedure to repair a small, weak spot in the groin muscles that allows fat or intestines from inside the abdomen to bulge out (inguinal hernia). This procedure may be planned, or it may be an emergency procedure. ?During the procedure, tissue that has bulged out is moved back into place, and the opening in the groin muscles is repaired. This is done through three small incisions in the abdomen. A thin tube with a light and camera on the end (laparoscope) is used to help perform the procedure. ?Tell a health care provider about: ?Any allergies you have. ?All medicines you are taking, including vitamins, herbs, eye drops, creams, and over-the-counter medicines. ?Any problems you or family members have had with anesthetic medicines. ?Any blood disorders you have. ?Any surgeries you have had. ?Any medical conditions you have. ?Whether you are pregnant or may be pregnant. ?What are the risks? ?Generally, this is a safe procedure. However, problems may occur, including: ?Infection. ?Bleeding. ?Allergic reactions to medicines. ?Damage to nearby structures or organs. ?Testicle damage or long-term pain and swelling of the scrotum, in males. ?Inability to completely empty the bladder (urinary retention). ?Blood clots. ?A collection of fluid that builds up under the skin (seroma). ?The hernia coming back (recurrence). ?What happens before the procedure? ?Staying hydrated ?Follow instructions from your health care provider about hydration, which may include: ?Up to 2 hours before the procedure - you may continue to  drink clear liquids, such as water, clear fruit juice, black coffee, and plain tea. ? ?Eating and drinking restrictions ?Follow instructions from your health care provider about eating and drinking, which may include: ?8 hours before the procedure - stop eating heavy meals or foods, such as meat, fried foods, or fatty foods. ?6 hours before the procedure - stop eating light meals or foods, such as toast or cereal. ?6 hours before the procedure - stop drinking milk or drinks that contain milk. ?2 hours before the procedure - stop drinking clear liquids. ?Medicines ?Ask your health care provider about: ?Changing or stopping your regular medicines. This is especially important if you are taking diabetes medicines or blood thinners. ?Taking medicines such as aspirin and ibuprofen. These medicines can thin your blood. Do not take these medicines unless your health care provider tells you to take them. ?Taking over-the-counter medicines, vitamins, herbs, and supplements. ?General instructions ?Do not use any products that contain nicotine or tobacco for at least 4 weeks before the procedure, if possible. These products include cigarettes, chewing tobacco, and vaping devices, such as e-cigarettes. If you need help quitting, ask your health care provider. ?Ask your health care provider: ?How your surgery site will be marked. ?What steps will be taken to help prevent infection. These steps may include: ?Removing hair at the surgery site. ?Washing skin with a germ-killing soap. ?Taking antibiotic medicine. ?Plan to have a responsible adult take you home from the hospital or clinic. ?Plan to have a responsible adult care for you for the time you are told after you leave the hospital or clinic. This is important. ?What happens during the procedure? ?An IV  will be inserted into one of your veins. ?You will be given one or more of the following: ?A medicine to help you relax (sedative). ?A medicine to make you fall asleep  (general anesthetic). ?Three small incisions will be made in your abdomen. ?Your abdomen will be inflated with carbon dioxide gas to make the surgical area easier to see. ?A laparoscope and surgical instruments will be inserted through the incisions. The laparoscope will send images of the inside of your abdomen to a monitor in the room. ?Tissue that is bulging through the hernia may be removed or moved back into place. ?The hernia opening will be closed with a sheet of surgical mesh. ?The surgical instruments and laparoscope will be removed. ?Your incisions will be closed with stitches (sutures) and adhesive strips. ?A bandage (dressing) will be placed over your incisions. ?The procedure may vary among health care providers and hospitals. ?What happens after the procedure? ?Your blood pressure, heart rate, breathing rate, and blood oxygen level will be monitored until you leave the hospital or clinic. ?You will be given pain medicine as needed. ?You may continue to receive medicines and fluids through an IV. The IV will be removed after you can drink fluids. ?You will be encouraged to get up and move around and to take deep breaths frequently. ?If you were given a sedative during the procedure, it can affect you for several hours. Do not drive or operate machinery until your health care provider says that it is safe. ?Summary ?Laparoscopic inguinal hernia repair is a surgical procedure to repair a small, weak spot in the groin muscles that allows fat or intestines from inside the abdomen to bulge out (inguinal hernia). ?This procedure is done through three small incisions in the abdomen. A thin tube with a light and camera on the end (laparoscope) is used to help perform the procedure. ?After the procedure, you will be encouraged to get up and move around and to take deep breaths frequently. ?This information is not intended to replace advice given to you by your health care provider. Make sure you discuss any  questions you have with your health care provider. ?Document Revised: 06/18/2020 Document Reviewed: 06/18/2020 ?Elsevier Patient Education ? [place]. ? ?

## 2022-02-10 ENCOUNTER — Telehealth: Payer: Self-pay | Admitting: Surgery

## 2022-02-10 NOTE — Telephone Encounter (Signed)
Patient has been advised of Pre-Admission date/time, COVID Testing date and Surgery date. ? ?Surgery Date: 02/19/22 ?Preadmission Testing Date: 02/13/22 (phone 8a-1p) ?Covid Testing Date: Not needed.    ? ?Patient has been made aware to call [phone], between 1-3:00pm the day before surgery, to find out what time to arrive for surgery.   ? ?

## 2022-02-10 NOTE — Progress Notes (Signed)
Request for Medical Clearance has been faxed to Lang Snow, NP.  ?

## 2022-02-11 ENCOUNTER — Encounter: Payer: Self-pay | Admitting: Surgery

## 2022-02-13 ENCOUNTER — Encounter
Admission: RE | Admit: 2022-02-13 | Discharge: 2022-02-13 | Disposition: A | Discharge status: Home / Self Care | Payer: Medicare Other | Source: Ambulatory Visit | Attending: Surgery | Admitting: Surgery

## 2022-02-13 ENCOUNTER — Other Ambulatory Visit: Payer: Self-pay

## 2022-02-13 ENCOUNTER — Ambulatory Visit
Admission: RE | Admit: 2022-02-13 | Discharge: 2022-02-13 | Disposition: A | Discharge status: Home / Self Care | Payer: Medicare Other | Source: Ambulatory Visit | Attending: Family Medicine | Admitting: Family Medicine

## 2022-02-13 DIAGNOSIS — Z1231 Encounter for screening mammogram for malignant neoplasm of breast: Secondary | ICD-10-CM | POA: Diagnosis not present

## 2022-02-13 HISTORY — DX: Other specified postprocedural states: R11.2

## 2022-02-13 HISTORY — DX: Gastro-esophageal reflux disease without esophagitis: K21.9

## 2022-02-13 HISTORY — DX: Other constipation: K59.09

## 2022-02-13 HISTORY — DX: Personal history of irradiation: Z92.3

## 2022-02-13 HISTORY — DX: Personal history of antineoplastic chemotherapy: Z92.21

## 2022-02-13 HISTORY — DX: Other specified postprocedural states: Z98.890

## 2022-02-13 NOTE — Pre-Procedure Instructions (Signed)
Called over to Dr Maudry Diego Shah's office at [facility] neuro to see if pt needs to stop her Parkinson's med Rasagiline prior to her surgery on 02-19-22. Research is showing it needs to be stopped 14 days prior to surgery. Receptionist took info and will reach out to his nurse to get back with me about Rasagiline ?

## 2022-02-13 NOTE — Patient Instructions (Addendum)
Your procedure is scheduled on: 02/19/22 Report to [place]. To find out your arrival time please call [phone] between 1PM - 3PM on 02/18/22 .  Remember: Instructions that are not followed completely may result in serious medical risk, up to and including death, or upon the discretion of your surgeon and anesthesiologist your surgery may need to be rescheduled.     _X__ 1. Do not eat food after midnight the night before your procedure.                 No gum chewing or hard candies. You may drink clear liquids up to 2 hours                 before you are scheduled to arrive for your surgery- DO not drink clear                 liquids within 2 hours of the start of your surgery.                 Clear Liquids include:  water, apple juice without pulp, clear carbohydrate                 drink such as Clearfast or Gatorade, Black Coffee or Tea (Do not add                 anything to coffee or tea). Diabetics water only  __X__2.  On the morning of surgery brush your teeth with toothpaste and water, you                 may rinse your mouth with mouthwash if you wish.  Do not swallow any              toothpaste of mouthwash.     _X__ 3.  No Alcohol for 24 hours before or after surgery.   _X__ 4.  Do Not Smoke or use e-cigarettes For 24 Hours Prior to Your Surgery.                 Do not use any chewable tobacco products for at least 6 hours prior to                 surgery.  ____  5.  Bring all medications with you on the day of surgery if instructed.   __X__  6.  Notify your doctor if there is any change in your medical condition      (cold, fever, infections).     Do not wear jewelry, make-up, hairpins, clips or nail polish. Do not wear lotions, powders, or perfumes. You may wear deodorant Do not shave body hair 48 hours prior to surgery. Men may shave face and neck. Do not bring valuables to the hospital.    [facility] is not  responsible for any belongings or valuables.  Contacts, dentures/partials or body piercings may not be worn into surgery. Bring a case for your contacts, glasses or hearing aids, a denture cup will be supplied. Leave your suitcase in the car. After surgery it may be brought to your room. For patients admitted to the hospital, discharge time is determined by your treatment team.   Patients discharged the day of surgery will not be allowed to drive home.   Please read over the following fact sheets that you were given:   MRSA Information, CHG soap  __X__ Take these medicines the morning of  surgery with A SIP OF WATER:    1. carbidopa-levodopa (SINEMET IR) 25-250 MG tablet (take as normal if needed more than once in the morning)  2. famotidine-calcium carbonate-magnesium hydroxide (PEPCID COMPLETE) 10-800-165 MG chewable tablet  3. LINZESS 72 MCG capsule  4. rasagiline (AZILECT) 1 MG TABS tablet  5.  6.  ____ Fleet Enema (as directed)   __X__ Use CHG Soap/SAGE wipes as directed  ____ Use inhalers on the day of surgery  ____ Stop metformin/Janumet/Farxiga 2 days prior to surgery    ____ Take 1/2 of usual insulin dose the night before surgery. No insulin the morning          of surgery.   ____ Stop Blood Thinners Coumadin/Plavix/Xarelto/Pleta/Pradaxa/Eliquis/Effient/Aspirin  on   Or contact your Surgeon, Cardiologist or Medical Doctor regarding  ability to stop your blood thinners  __X__ Stop Anti-inflammatories 7 days before surgery such as Advil, Ibuprofen, Motrin,  BC or Goodies Powder, Naprosyn, Naproxen, Aleve, Aspirin   You may use Tylenol for pain if needed before surgery.  __X__ Stop all herbals and supplements, fish oil or vitamins  until after surgery.    ____ Bring C-Pap to the hospital.

## 2022-02-16 ENCOUNTER — Other Ambulatory Visit
Admission: RE | Admit: 2022-02-16 | Discharge: 2022-02-16 | Disposition: A | Discharge status: Home / Self Care | Payer: Medicare Other | Source: Ambulatory Visit | Attending: Surgery | Admitting: Surgery

## 2022-02-16 DIAGNOSIS — Z01810 Encounter for preprocedural cardiovascular examination: Secondary | ICD-10-CM | POA: Insufficient documentation

## 2022-02-16 NOTE — Pre-Procedure Instructions (Signed)
Dr Trena Platt office called back to say that Dr Manuella Ghazi states it is ok to continue pts Rasagiline prior to upcoming 4-20 surgery ?

## 2022-02-17 ENCOUNTER — Telehealth: Payer: Self-pay

## 2022-02-17 NOTE — Telephone Encounter (Signed)
I called and spoke with [facility] internal medicine. I let them know we had faxed a request for medical clearance for the patient for surgery on 02/10/22 and had not heard anything back from them. Her surgery is scheduled for 02/19/22. I have re faxed the request also.  ?

## 2022-02-19 ENCOUNTER — Telehealth: Payer: Self-pay | Admitting: Surgery

## 2022-02-19 NOTE — Telephone Encounter (Signed)
Updated information regarding rescheduled surgery at patient's request.   ? ?Pre-Admission date/time, COVID Testing date and Surgery date. ? ?Surgery Date: 03/05/22 ?Preadmission Testing Date: 02/26/22 (phone 8a-1p) ?Covid Testing Date: Not needed.   ? ?Patient has been made aware to call [phone], between 1-3:00pm the day before surgery, to find out what time to arrive for surgery.   ? ?

## 2022-02-26 ENCOUNTER — Other Ambulatory Visit: Payer: Medicare Other

## 2022-03-04 NOTE — Progress Notes (Signed)
Medical Clearance received from Lang Snow, NP. The patient is cleared at Low risk.  ?

## 2022-03-09 ENCOUNTER — Other Ambulatory Visit
Admission: RE | Admit: 2022-03-09 | Discharge: 2022-03-09 | Disposition: A | Discharge status: Home / Self Care | Payer: Medicare Other | Source: Ambulatory Visit | Attending: Surgery | Admitting: Surgery

## 2022-03-09 NOTE — Pre-Procedure Instructions (Signed)
Pre- admission pre op oders reviewed, no changes in medications per patient, surgery date confirmed with patient to on 03/17/22 as she voiced that she believed it to be on 03/18/22. She was reminded to call on 03/16/22 for arrival time and to call with questions or concerns. ?

## 2022-03-11 ENCOUNTER — Encounter: Payer: Self-pay | Admitting: Surgery

## 2022-03-11 ENCOUNTER — Ambulatory Visit (INDEPENDENT_AMBULATORY_CARE_PROVIDER_SITE_OTHER): Payer: Medicare Other | Admitting: Surgery

## 2022-03-11 VITALS — BP 140/74 | HR 79 | Temp 98.0°F | Wt 174.2 lb

## 2022-03-11 DIAGNOSIS — K4090 Unilateral inguinal hernia, without obstruction or gangrene, not specified as recurrent: Secondary | ICD-10-CM | POA: Diagnosis not present

## 2022-03-11 NOTE — Patient Instructions (Signed)
If you have any concerns or questions, please feel free to call our  office.  Laparoscopic Inguinal Hernia Repair, Adult Laparoscopic inguinal hernia repair is a surgical procedure to repair a small, weak spot in the groin muscles that allows fat or intestines from inside the abdomen to bulge out (inguinal hernia). This procedure may be planned, or it may be an emergency procedure. During the procedure, tissue that has bulged out is moved back into place, and the opening in the groin muscles is repaired. This is done through three small incisions in the abdomen. A thin tube with a light and camera on the end (laparoscope) is used to help perform the procedure. Tell a health care provider about: Any allergies you have. All medicines you are taking, including vitamins, herbs, eye drops, creams, and over-the-counter medicines. Any problems you or family members have had with anesthetic medicines. Any blood disorders you have. Any surgeries you have had. Any medical conditions you have. Whether you are pregnant or may be pregnant. What are the risks? Generally, this is a safe procedure. However, problems may occur, including: Infection. Bleeding. Allergic reactions to medicines. Damage to nearby structures or organs. Testicle damage or long-term pain and swelling of the scrotum, in males. Inability to completely empty the bladder (urinary retention). Blood clots. A collection of fluid that builds up under the skin (seroma). The hernia coming back (recurrence). What happens before the procedure? Staying hydrated Follow instructions from your health care provider about hydration, which may include: Up to 2 hours before the procedure - you may continue to drink clear liquids, such as water, clear fruit juice, black coffee, and plain tea.  Eating and drinking restrictions Follow instructions from your health care provider about eating and drinking, which may include: 8 hours before the  procedure - stop eating heavy meals or foods, such as meat, fried foods, or fatty foods. 6 hours before the procedure - stop eating light meals or foods, such as toast or cereal. 6 hours before the procedure - stop drinking milk or drinks that contain milk. 2 hours before the procedure - stop drinking clear liquids. Medicines Ask your health care provider about: Changing or stopping your regular medicines. This is especially important if you are taking diabetes medicines or blood thinners. Taking medicines such as aspirin and ibuprofen. These medicines can thin your blood. Do not take these medicines unless your health care provider tells you to take them. Taking over-the-counter medicines, vitamins, herbs, and supplements. General instructions Do not use any products that contain nicotine or tobacco for at least 4 weeks before the procedure, if possible. These products include cigarettes, chewing tobacco, and vaping devices, such as e-cigarettes. If you need help quitting, ask your health care provider. Ask your health care provider: How your surgery site will be marked. What steps will be taken to help prevent infection. These steps may include: Removing hair at the surgery site. Washing skin with a germ-killing soap. Taking antibiotic medicine. Plan to have a responsible adult take you home from the hospital or clinic. Plan to have a responsible adult care for you for the time you are told after you leave the hospital or clinic. This is important. What happens during the procedure? An IV will be inserted into one of your veins. You will be given one or more of the following: A medicine to help you relax (sedative). A medicine to make you fall asleep (general anesthetic). Three small incisions will be made in your abdomen. Your   abdomen will be inflated with carbon dioxide gas to make the surgical area easier to see. A laparoscope and surgical instruments will be inserted through the  incisions. The laparoscope will send images of the inside of your abdomen to a monitor in the room. Tissue that is bulging through the hernia may be removed or moved back into place. The hernia opening will be closed with a sheet of surgical mesh. The surgical instruments and laparoscope will be removed. Your incisions will be closed with stitches (sutures) and adhesive strips. A bandage (dressing) will be placed over your incisions. The procedure may vary among health care providers and hospitals. What happens after the procedure? Your blood pressure, heart rate, breathing rate, and blood oxygen level will be monitored until you leave the hospital or clinic. You will be given pain medicine as needed. You may continue to receive medicines and fluids through an IV. The IV will be removed after you can drink fluids. You will be encouraged to get up and move around and to take deep breaths frequently. If you were given a sedative during the procedure, it can affect you for several hours. Do not drive or operate machinery until your health care provider says that it is safe. Summary Laparoscopic inguinal hernia repair is a surgical procedure to repair a small, weak spot in the groin muscles that allows fat or intestines from inside the abdomen to bulge out (inguinal hernia). This procedure is done through three small incisions in the abdomen. A thin tube with a light and camera on the end (laparoscope) is used to help perform the procedure. After the procedure, you will be encouraged to get up and move around and to take deep breaths frequently. This information is not intended to replace advice given to you by your health care provider. Make sure you discuss any questions you have with your health care provider. Document Revised: 06/18/2020 Document Reviewed: 06/18/2020 Elsevier Patient Education  2023 [employer]  

## 2022-03-12 NOTE — H&P (View-Only) (Signed)
Outpatient Surgical Follow Up ? ?03/12/2022 ? ?Dorothy Mccoy is an 71 y.o. female.  ? ?Chief Complaint  ?Patient presents with  ? Follow-up  ?  Discuss surgery 03/17/22  ? ? ?HPI: 71 year old female well-known to me with a history of left inguinal hernia.  She continues to have some intermittent discomfort in the area she did have a right inguinal hernia repair 4 years ago.  She also had a breast reconstruction with TRAM flap. ?I have Also performed a CT of the abdomen pelvis personally reviewed showing evidence of left inguinal hernia and prior mesh. ?She is here for surgical discussion ? ?Past Medical History:  ?Diagnosis Date  ? Breast cancer ([place])   ? bilateral  ? Chronic constipation   ? GERD (gastroesophageal reflux disease)   ? Osteoarthritis   ? Parkinson disease ([place])   ? Personal history of chemotherapy   ? Personal history of radiation therapy   ? PONV (postoperative nausea and vomiting)   ? Psoriatic arthritis ([place])   ? Pulmonary embolism ([place]) 2000  ? following mastectomy  ? ? ?Past Surgical History:  ?Procedure Laterality Date  ? BREAST LUMPECTOMY Right 2005  ? CATARACT EXTRACTION Bilateral   ? INGUINAL HERNIA REPAIR Right 2018  ? MASTECTOMY Left 2000  ? MYOMECTOMY    ? TOTAL KNEE ARTHROPLASTY Left   ? ? ?History reviewed. No pertinent family history. ? ?Social History:  reports that she has quit smoking. Her smoking use included cigarettes. She has never used smokeless tobacco. She reports current alcohol use. She reports that she does not use drugs. ? ?Allergies:  ?Allergies  ?Allergen Reactions  ? Ciprofloxacin Hcl Nausea Only  ? ? ?Medications reviewed. ? ? ? ?ROS ?Full ROS performed and is otherwise negative other than what is stated in HPI ? ? ?BP 140/74   Pulse 79   Temp 98 ?F (36.7 ?C) (Oral)   Wt 174 lb 3.2 oz (79 kg)   SpO2 96%   BMI 25.72 kg/m?  ? ?Physical Exam ?Vitals and nursing note reviewed. Exam conducted with a chaperone present.  ?Constitutional:   ?   General: She is not in acute  distress. ?   Appearance: Normal appearance.  ?Neck:  ?   Vascular: No carotid bruit.  ?Cardiovascular:  ?   Rate and Rhythm: Normal rate and regular rhythm.  ?   Heart sounds: No murmur heard. ?Pulmonary:  ?   Effort: Pulmonary effort is normal. No respiratory distress.  ?   Breath sounds: Normal breath sounds. No stridor. No wheezing or rhonchi.  ?Abdominal:  ?   General: Abdomen is flat. There is no distension.  ?   Palpations: Abdomen is soft. There is no mass.  ?   Tenderness: There is no abdominal tenderness. There is no guarding.  ?   Hernia: A hernia is present.  ?   Comments: LIH reducible  ?Musculoskeletal:     ?   General: No tenderness. Normal range of motion.  ?   Cervical back: Normal range of motion and neck supple. No rigidity or tenderness.  ?Skin: ?   General: Skin is warm and dry.  ?   Capillary Refill: Capillary refill takes less than 2 seconds.  ?Neurological:  ?   General: No focal deficit present.  ?   Mental Status: She is alert and oriented to person, place, and time.  ?Psychiatric:     ?   Mood and Affect: Mood normal.     ?  Behavior: Behavior normal.     ?   Thought Content: Thought content normal.     ?   Judgment: Judgment normal.  ? ? ? ?Assessment/Plan: ? ?Assessment/Plan: ?71 year old female with symptomatic left inguinal hernia. I Discussed with the patient detail about my recommendation for repair.  I do think that she is a candidate for robotic repair.  She does have prior history of TRAM flap with mesh placement within the abdominal cavity.  The incision seems to be away from my proposed robotic approach.  Procedure discussed with patient in detail.  Risk, benefits and possible implications including but not limited to: Bleeding, bowel injuries, pain management issues.  She understands and wishes to proceed.  SPecifically also discussed the possibility of bilateral repairs were they to be present at the time of operation ?Please note that I have spent 30 minutes in this  encounter including reviewing medical records, imaging studies, coordinating his care, placing orders and performing appropriate documentation ? ?Caroleen Hamman, MD FACS ?General Surgeon  ?

## 2022-03-12 NOTE — Progress Notes (Signed)
Outpatient Surgical Follow Up ? ?03/12/2022 ? ?Dorothy Mccoy is an 71 y.o. female.  ? ?Chief Complaint  ?Patient presents with  ? Follow-up  ?  Discuss surgery 03/17/22  ? ? ?HPI: 71 year old female well-known to me with a history of left inguinal hernia.  She continues to have some intermittent discomfort in the area she did have a right inguinal hernia repair 4 years ago.  She also had a breast reconstruction with TRAM flap. ?I have Also performed a CT of the abdomen pelvis personally reviewed showing evidence of left inguinal hernia and prior mesh. ?She is here for surgical discussion ? ?Past Medical History:  ?Diagnosis Date  ? Breast cancer ([place])   ? bilateral  ? Chronic constipation   ? GERD (gastroesophageal reflux disease)   ? Osteoarthritis   ? Parkinson disease ([place])   ? Personal history of chemotherapy   ? Personal history of radiation therapy   ? PONV (postoperative nausea and vomiting)   ? Psoriatic arthritis ([place])   ? Pulmonary embolism ([place]) 2000  ? following mastectomy  ? ? ?Past Surgical History:  ?Procedure Laterality Date  ? BREAST LUMPECTOMY Right 2005  ? CATARACT EXTRACTION Bilateral   ? INGUINAL HERNIA REPAIR Right 2018  ? MASTECTOMY Left 2000  ? MYOMECTOMY    ? TOTAL KNEE ARTHROPLASTY Left   ? ? ?History reviewed. No pertinent family history. ? ?Social History:  reports that she has quit smoking. Her smoking use included cigarettes. She has never used smokeless tobacco. She reports current alcohol use. She reports that she does not use drugs. ? ?Allergies:  ?Allergies  ?Allergen Reactions  ? Ciprofloxacin Hcl Nausea Only  ? ? ?Medications reviewed. ? ? ? ?ROS ?Full ROS performed and is otherwise negative other than what is stated in HPI ? ? ?BP 140/74   Pulse 79   Temp 98 ?F (36.7 ?C) (Oral)   Wt 174 lb 3.2 oz (79 kg)   SpO2 96%   BMI 25.72 kg/m?  ? ?Physical Exam ?Vitals and nursing note reviewed. Exam conducted with a chaperone present.  ?Constitutional:   ?   General: She is not in acute  distress. ?   Appearance: Normal appearance.  ?Neck:  ?   Vascular: No carotid bruit.  ?Cardiovascular:  ?   Rate and Rhythm: Normal rate and regular rhythm.  ?   Heart sounds: No murmur heard. ?Pulmonary:  ?   Effort: Pulmonary effort is normal. No respiratory distress.  ?   Breath sounds: Normal breath sounds. No stridor. No wheezing or rhonchi.  ?Abdominal:  ?   General: Abdomen is flat. There is no distension.  ?   Palpations: Abdomen is soft. There is no mass.  ?   Tenderness: There is no abdominal tenderness. There is no guarding.  ?   Hernia: A hernia is present.  ?   Comments: LIH reducible  ?Musculoskeletal:     ?   General: No tenderness. Normal range of motion.  ?   Cervical back: Normal range of motion and neck supple. No rigidity or tenderness.  ?Skin: ?   General: Skin is warm and dry.  ?   Capillary Refill: Capillary refill takes less than 2 seconds.  ?Neurological:  ?   General: No focal deficit present.  ?   Mental Status: She is alert and oriented to person, place, and time.  ?Psychiatric:     ?   Mood and Affect: Mood normal.     ?  Behavior: Behavior normal.     ?   Thought Content: Thought content normal.     ?   Judgment: Judgment normal.  ? ? ? ?Assessment/Plan: ? ?Assessment/Plan: ?71 year old female with symptomatic left inguinal hernia. I Discussed with the patient detail about my recommendation for repair.  I do think that she is a candidate for robotic repair.  She does have prior history of TRAM flap with mesh placement within the abdominal cavity.  The incision seems to be away from my proposed robotic approach.  Procedure discussed with patient in detail.  Risk, benefits and possible implications including but not limited to: Bleeding, bowel injuries, pain management issues.  She understands and wishes to proceed.  SPecifically also discussed the possibility of bilateral repairs were they to be present at the time of operation ?Please note that I have spent 30 minutes in this  encounter including reviewing medical records, imaging studies, coordinating his care, placing orders and performing appropriate documentation ? ?Caroleen Hamman, MD FACS ?General Surgeon  ?

## 2022-03-16 MED ORDER — APREPITANT 40 MG PO CAPS: 40.0000 mg | ORAL_CAPSULE | Freq: Once | ORAL | Status: DC

## 2022-03-16 MED ORDER — ACETAMINOPHEN 500 MG PO TABS: 1000.0000 mg | ORAL_TABLET | ORAL | Status: AC

## 2022-03-16 MED ORDER — CELECOXIB 200 MG PO CAPS: 200.0000 mg | ORAL_CAPSULE | ORAL | Status: AC

## 2022-03-16 MED ORDER — CEFAZOLIN SODIUM-DEXTROSE 2-4 GM/100ML-% IV SOLN
2.0000 g | INTRAVENOUS | Status: AC
  Administered 2022-03-17: 2 g via INTRAVENOUS

## 2022-03-16 MED ORDER — GABAPENTIN 300 MG PO CAPS: 300.0000 mg | ORAL_CAPSULE | ORAL | Status: AC

## 2022-03-17 ENCOUNTER — Encounter
Admission: RE | Disposition: A | Discharge status: Home / Self Care | Payer: Self-pay | Source: Home / Self Care | Attending: Surgery

## 2022-03-17 ENCOUNTER — Encounter: Payer: Self-pay | Admitting: Surgery

## 2022-03-17 ENCOUNTER — Ambulatory Visit: Payer: Medicare Other | Admitting: Urgent Care

## 2022-03-17 ENCOUNTER — Other Ambulatory Visit: Payer: Self-pay

## 2022-03-17 ENCOUNTER — Ambulatory Visit
Admission: RE | Admit: 2022-03-17 | Discharge: 2022-03-17 | Disposition: A | Discharge status: Home / Self Care | Payer: Medicare Other | Attending: Surgery | Admitting: Surgery

## 2022-03-17 DIAGNOSIS — L4050 Arthropathic psoriasis, unspecified: Secondary | ICD-10-CM | POA: Diagnosis not present

## 2022-03-17 DIAGNOSIS — K4021 Bilateral inguinal hernia, without obstruction or gangrene, recurrent: Secondary | ICD-10-CM | POA: Diagnosis not present

## 2022-03-17 DIAGNOSIS — Z923 Personal history of irradiation: Secondary | ICD-10-CM | POA: Insufficient documentation

## 2022-03-17 DIAGNOSIS — Z87891 Personal history of nicotine dependence: Secondary | ICD-10-CM | POA: Diagnosis not present

## 2022-03-17 DIAGNOSIS — G20 Parkinson's disease: Secondary | ICD-10-CM | POA: Diagnosis not present

## 2022-03-17 DIAGNOSIS — K4091 Unilateral inguinal hernia, without obstruction or gangrene, recurrent: Secondary | ICD-10-CM

## 2022-03-17 DIAGNOSIS — Z853 Personal history of malignant neoplasm of breast: Secondary | ICD-10-CM | POA: Diagnosis not present

## 2022-03-17 DIAGNOSIS — K219 Gastro-esophageal reflux disease without esophagitis: Secondary | ICD-10-CM | POA: Diagnosis not present

## 2022-03-17 DIAGNOSIS — Z9221 Personal history of antineoplastic chemotherapy: Secondary | ICD-10-CM | POA: Diagnosis not present

## 2022-03-17 DIAGNOSIS — Z86711 Personal history of pulmonary embolism: Secondary | ICD-10-CM | POA: Diagnosis not present

## 2022-03-17 DIAGNOSIS — K4090 Unilateral inguinal hernia, without obstruction or gangrene, not specified as recurrent: Secondary | ICD-10-CM | POA: Diagnosis not present

## 2022-03-17 DIAGNOSIS — K4020 Bilateral inguinal hernia, without obstruction or gangrene, not specified as recurrent: Secondary | ICD-10-CM | POA: Diagnosis present

## 2022-03-17 SURGERY — HERNIORRHAPHY, INGUINAL, ROBOT-ASSISTED, LAPAROSCOPIC
Anesthesia: General | Laterality: Bilateral

## 2022-03-17 MED ORDER — CHLORHEXIDINE GLUCONATE CLOTH 2 % EX PADS: 6.0000 | MEDICATED_PAD | Freq: Once | CUTANEOUS | Status: DC

## 2022-03-17 MED ORDER — OXYCODONE HCL 5 MG PO TABS
5.0000 mg | ORAL_TABLET | Freq: Once | ORAL | Status: AC | PRN
  Administered 2022-03-17: 5 mg via ORAL

## 2022-03-17 MED ORDER — MIDAZOLAM HCL 2 MG/2ML IJ SOLN
INTRAMUSCULAR | Status: AC
  Filled 2022-03-17: qty 2

## 2022-03-17 MED ORDER — APREPITANT 40 MG PO CAPS
40.0000 mg | ORAL_CAPSULE | ORAL | Status: AC
  Administered 2022-03-17: 40 mg via ORAL

## 2022-03-17 MED ORDER — PHENYLEPHRINE HCL-NACL 20-0.9 MG/250ML-% IV SOLN
INTRAVENOUS | Status: DC | PRN
  Administered 2022-03-17: 25 ug/min via INTRAVENOUS

## 2022-03-17 MED ORDER — BUPIVACAINE LIPOSOME 1.3 % IJ SUSP
INTRAMUSCULAR | Status: AC
  Filled 2022-03-17: qty 20

## 2022-03-17 MED ORDER — LIDOCAINE HCL (CARDIAC) PF 100 MG/5ML IV SOSY
PREFILLED_SYRINGE | INTRAVENOUS | Status: DC | PRN
  Administered 2022-03-17: 100 mg via INTRAVENOUS

## 2022-03-17 MED ORDER — LACTATED RINGERS IV SOLN: INTRAVENOUS | Status: DC

## 2022-03-17 MED ORDER — CELECOXIB 200 MG PO CAPS
ORAL_CAPSULE | ORAL | Status: AC
  Administered 2022-03-17: 200 mg via ORAL
  Filled 2022-03-17: qty 1

## 2022-03-17 MED ORDER — ACETAMINOPHEN 500 MG PO TABS
ORAL_TABLET | ORAL | Status: AC
  Administered 2022-03-17: 1000 mg via ORAL
  Filled 2022-03-17: qty 2

## 2022-03-17 MED ORDER — BUPIVACAINE-EPINEPHRINE (PF) 0.25% -1:200000 IJ SOLN
INTRAMUSCULAR | Status: DC | PRN
  Administered 2022-03-17: 50 mL

## 2022-03-17 MED ORDER — ORAL CARE MOUTH RINSE: 15.0000 mL | Freq: Once | OROMUCOSAL | Status: AC

## 2022-03-17 MED ORDER — SUGAMMADEX SODIUM 500 MG/5ML IV SOLN
INTRAVENOUS | Status: DC | PRN
  Administered 2022-03-17: 350 mg via INTRAVENOUS

## 2022-03-17 MED ORDER — OXYCODONE HCL 5 MG/5ML PO SOLN: 5.0000 mg | Freq: Once | ORAL | Status: AC | PRN

## 2022-03-17 MED ORDER — APREPITANT 40 MG PO CAPS
ORAL_CAPSULE | ORAL | Status: AC
  Filled 2022-03-17: qty 1

## 2022-03-17 MED ORDER — ONDANSETRON HCL 4 MG/2ML IJ SOLN
INTRAMUSCULAR | Status: DC | PRN
  Administered 2022-03-17 (×2): 4 mg via INTRAVENOUS

## 2022-03-17 MED ORDER — PHENYLEPHRINE HCL-NACL 20-0.9 MG/250ML-% IV SOLN
INTRAVENOUS | Status: AC
  Filled 2022-03-17: qty 250

## 2022-03-17 MED ORDER — CEFAZOLIN SODIUM-DEXTROSE 2-4 GM/100ML-% IV SOLN
INTRAVENOUS | Status: AC
  Filled 2022-03-17: qty 100

## 2022-03-17 MED ORDER — FENTANYL CITRATE (PF) 100 MCG/2ML IJ SOLN
INTRAMUSCULAR | Status: AC
  Filled 2022-03-17: qty 2

## 2022-03-17 MED ORDER — CHLORHEXIDINE GLUCONATE 0.12 % MT SOLN
OROMUCOSAL | Status: AC
  Administered 2022-03-17: 15 mL via OROMUCOSAL
  Filled 2022-03-17: qty 15

## 2022-03-17 MED ORDER — HYDROMORPHONE HCL 1 MG/ML IJ SOLN
INTRAMUSCULAR | Status: DC | PRN
  Administered 2022-03-17: 1 mg via INTRAVENOUS

## 2022-03-17 MED ORDER — FENTANYL CITRATE (PF) 100 MCG/2ML IJ SOLN
INTRAMUSCULAR | Status: DC | PRN
  Administered 2022-03-17: 50 ug via INTRAVENOUS

## 2022-03-17 MED ORDER — HYDROMORPHONE HCL 1 MG/ML IJ SOLN
INTRAMUSCULAR | Status: AC
  Filled 2022-03-17: qty 1

## 2022-03-17 MED ORDER — DEXAMETHASONE SODIUM PHOSPHATE 10 MG/ML IJ SOLN
INTRAMUSCULAR | Status: DC | PRN
  Administered 2022-03-17: 10 mg via INTRAVENOUS

## 2022-03-17 MED ORDER — ROCURONIUM BROMIDE 100 MG/10ML IV SOLN
INTRAVENOUS | Status: DC | PRN
Start: 2022-03-17 — End: 2022-03-17
  Administered 2022-03-17: 50 mg via INTRAVENOUS

## 2022-03-17 MED ORDER — GABAPENTIN 300 MG PO CAPS
ORAL_CAPSULE | ORAL | Status: AC
  Administered 2022-03-17: 300 mg via ORAL
  Filled 2022-03-17: qty 1

## 2022-03-17 MED ORDER — OXYCODONE HCL 5 MG PO TABS
ORAL_TABLET | ORAL | Status: AC
  Filled 2022-03-17: qty 1

## 2022-03-17 MED ORDER — CHLORHEXIDINE GLUCONATE 0.12 % MT SOLN: 15.0000 mL | Freq: Once | OROMUCOSAL | Status: AC

## 2022-03-17 MED ORDER — FENTANYL CITRATE (PF) 100 MCG/2ML IJ SOLN: 25.0000 ug | INTRAMUSCULAR | Status: DC | PRN

## 2022-03-17 MED ORDER — GLYCOPYRROLATE 0.2 MG/ML IJ SOLN
INTRAMUSCULAR | Status: DC | PRN
  Administered 2022-03-17: .2 mg via INTRAVENOUS

## 2022-03-17 MED ORDER — BUPIVACAINE-EPINEPHRINE (PF) 0.25% -1:200000 IJ SOLN
INTRAMUSCULAR | Status: AC
  Filled 2022-03-17: qty 30

## 2022-03-17 MED ORDER — HYDROCODONE-ACETAMINOPHEN 5-325 MG PO TABS: 1.0000 | ORAL_TABLET | ORAL | 0 refills | Status: DC | PRN

## 2022-03-17 MED ORDER — EPHEDRINE SULFATE (PRESSORS) 50 MG/ML IJ SOLN
INTRAMUSCULAR | Status: DC | PRN
  Administered 2022-03-17: 5 mg via INTRAVENOUS

## 2022-03-17 MED ORDER — PROPOFOL 10 MG/ML IV BOLUS
INTRAVENOUS | Status: DC | PRN
  Administered 2022-03-17: 150 mg via INTRAVENOUS

## 2022-03-17 SURGICAL SUPPLY — 57 items
ADH SKN CLS APL DERMABOND .7 (GAUZE/BANDAGES/DRESSINGS) ×1
CANNULA REDUC XI 12-8 STAPL (CANNULA) ×1
CANNULA REDUCER 12-8 DVNC XI (CANNULA) ×1 IMPLANT
COVER TIP SHEARS 8 DVNC (MISCELLANEOUS) ×1 IMPLANT
COVER TIP SHEARS 8MM DA VINCI (MISCELLANEOUS) ×1
COVER WAND RF STERILE (DRAPES) ×2 IMPLANT
DEFOGGER SCOPE WARMER CLEARIFY (MISCELLANEOUS) ×2 IMPLANT
DERMABOND ADVANCED (GAUZE/BANDAGES/DRESSINGS) ×1
DERMABOND ADVANCED .7 DNX12 (GAUZE/BANDAGES/DRESSINGS) ×1 IMPLANT
DRAPE 3/4 80X56 (DRAPES) ×1 IMPLANT
DRAPE ARM DVNC X/XI (DISPOSABLE) ×3 IMPLANT
DRAPE COLUMN DVNC XI (DISPOSABLE) ×1 IMPLANT
DRAPE DA VINCI XI ARM (DISPOSABLE) ×3
DRAPE DA VINCI XI COLUMN (DISPOSABLE) ×1
ELECT CAUTERY BLADE 6.4 (BLADE) ×2 IMPLANT
ELECT REM PT RETURN 9FT ADLT (ELECTROSURGICAL) ×2
ELECTRODE REM PT RTRN 9FT ADLT (ELECTROSURGICAL) ×1 IMPLANT
GLOVE BIO SURGEON STRL SZ7 (GLOVE) ×10 IMPLANT
GOWN STRL REUS W/ TWL LRG LVL3 (GOWN DISPOSABLE) ×4 IMPLANT
GOWN STRL REUS W/TWL LRG LVL3 (GOWN DISPOSABLE) ×8
IRRIGATION STRYKERFLOW (MISCELLANEOUS) ×1 IMPLANT
IRRIGATOR STRYKERFLOW (MISCELLANEOUS)
IV NS 1000ML (IV SOLUTION)
IV NS 1000ML BAXH (IV SOLUTION) IMPLANT
KIT PINK PAD W/HEAD ARE REST (MISCELLANEOUS) ×2
KIT PINK PAD W/HEAD ARM REST (MISCELLANEOUS) ×1 IMPLANT
LABEL OR SOLS (LABEL) ×2 IMPLANT
MANIFOLD NEPTUNE II (INSTRUMENTS) ×2 IMPLANT
MESH 3DMAX 5X7 LT XLRG (Mesh General) ×1 IMPLANT
MESH 3DMAX 5X7 RT XLRG (Mesh General) ×1 IMPLANT
MESH 3DMAX MID 5X7 LT XLRG (Mesh General) IMPLANT
MESH 3DMAX MID 5X7 RT XLRG (Mesh General) IMPLANT
NDL INSUFFLATION 14GA 120MM (NEEDLE) IMPLANT
NEEDLE HYPO 22GX1.5 SAFETY (NEEDLE) ×2 IMPLANT
NEEDLE INSUFFLATION 14GA 120MM (NEEDLE) ×2 IMPLANT
OBTURATOR OPTICAL STANDARD 8MM (TROCAR) ×1
OBTURATOR OPTICAL STND 8 DVNC (TROCAR) ×1
OBTURATOR OPTICALSTD 8 DVNC (TROCAR) ×1 IMPLANT
PACK LAP CHOLECYSTECTOMY (MISCELLANEOUS) ×2 IMPLANT
PENCIL ELECTRO HAND CTR (MISCELLANEOUS) ×2 IMPLANT
SEAL CANN UNIV 5-8 DVNC XI (MISCELLANEOUS) ×3 IMPLANT
SEAL XI 5MM-8MM UNIVERSAL (MISCELLANEOUS) ×3
SET TUBE SMOKE EVAC HIGH FLOW (TUBING) ×2 IMPLANT
SOLUTION ELECTROLUBE (MISCELLANEOUS) ×2 IMPLANT
SPONGE T-LAP 18X18 ~~LOC~~+RFID (SPONGE) ×2 IMPLANT
STAPLER CANNULA SEAL DVNC XI (STAPLE) ×1 IMPLANT
STAPLER CANNULA SEAL XI (STAPLE) ×1
SUT MNCRL AB 4-0 PS2 18 (SUTURE) ×2 IMPLANT
SUT V-LOC 90 ABS 3-0 VLT  V-20 (SUTURE) ×2
SUT V-LOC 90 ABS 3-0 VLT V-20 (SUTURE) ×2 IMPLANT
SUT VIC AB 2-0 SH 27 (SUTURE) ×4
SUT VIC AB 2-0 SH 27XBRD (SUTURE) ×2 IMPLANT
SUT VICRYL 0 AB UR-6 (SUTURE) ×4 IMPLANT
SYR 20ML LL LF (SYRINGE) ×2 IMPLANT
SYR 30ML LL (SYRINGE) ×2 IMPLANT
TAPE TRANSPORE STRL 2 31045 (GAUZE/BANDAGES/DRESSINGS) ×2 IMPLANT
WATER STERILE IRR 500ML POUR (IV SOLUTION) ×1 IMPLANT

## 2022-03-17 NOTE — Op Note (Signed)
Robotic assisted Laparoscopic Transabdominal BIlateral Inguinal Hernia Repair with 3 D Mid X-large Mesh ?  ?  ?  ?Pre-operative Diagnosis:  Left Inguinal Hernia ?  ?Post-operative Diagnosis: Bilateral Inguinal hernias, right recurrent ?  ?Procedure: Robotic  Laparoscopic  repair of bilateral inguinal hernias w 3 D mid Mesh X-Large ?  ?Surgeon: Caroleen Hamman, MD FACS ?  ?Anesthesia: Gen. with endotracheal tube ?  ?Findings: Indirect Left inguinal hernia, Recurrent Right Inguinal/femoral hernia      ?Prior mesh on the right side folded on its self, recurrence was inferior to the mesh ?  ?Procedure Details  ?The patient was seen again in the Holding Room. The benefits, complications, treatment options, and expected outcomes were discussed with the patient. The risks of bleeding, infection, recurrence of symptoms, failure to resolve symptoms, recurrence of hernia, ischemic orchitis, chronic pain syndrome or neuroma, were discussed again. The likelihood of improving the patient's symptoms with return to their baseline status is good.  The patient and/or family concurred with the proposed plan, giving informed consent.  The patient was taken to Operating Room, identified  and the procedure verified as Laparoscopic Inguinal Hernia Repair. Laterality confirmed. ? A Time Out was held and the above information confirmed. ?  ?Prior to the induction of general anesthesia, antibiotic prophylaxis was administered. VTE prophylaxis was in place. General endotracheal anesthesia was then administered and tolerated well. After the induction, the abdomen was prepped with Chloraprep and draped in the sterile fashion. The patient was positioned in the supine position. ?  ?Veres needle was inserted at Palmer's point with appropriate saline drop test. Pneumoperitoneum obtained w/o HD changes. No evidence of bowel injuries. One port was placed using optiview technique, Two additional 8 mm placed under direct vision. The laparoscopy revealed  large Left indirect defect and recurrent Right defect inferior to prior mesh . I inserted the needles and the mesh. ?The robot was brought ot the table and docked in the standard fashion, no collision between arms was observed. Instruments were kept under direct view at all times. ?We started on the Left side were a flap was created. The sac was reduced and dissected free from adjacent structures. We divided the round ligament.. Once dissection was completed an X- large mid 3D mesh was placed and secured with two interrupted vicryl attached to the pubic tubercle. ?There was good coverage of the direct, indirect and femoral spaces. ?The flap was closed with v lock suture. ?  ?Attention then was turned to the Right side were a flap was created. The prior mesh was well incorporated but folded on its own and with a recurrence inferior to it, Meticulous dissection was performed The sac was reduced and dissected free from adjacent structures. We preserved the femoral and Iliac vessels. Once dissection was completed a X large 3D mesh was placed and secured with two interrupted vicryl attached to the pubic tubercle. ?There was good coverage of the direct, indirect and femoral spaces. ?The flap was closed with v lock suture. ?Second look revealed no complications or injuries. ?  ? Once assuring that hemostasis was adequate the ports were removed and a figure-of-eight 0 Vicryl suture was placed at the fascial edges. 4-0 subcuticular Monocryl was used at all skin edges. Dermabond was placed.  ?Patient tolerated the procedure well. There were no complications. SHe was taken to the recovery room in stable condition.  ?  ?  ?   ?  ?      ?Caroleen Hamman, MD, FACS ?   ?

## 2022-03-17 NOTE — Interval H&P Note (Signed)
History and Physical Interval Note: ? ?03/17/2022 ?12:28 PM ? ?[name]  has presented today for surgery, with the diagnosis of inguinal hernia unilateral.  The various methods of treatment have been discussed with the patient and family. After consideration of risks, benefits and other options for treatment, the patient has consented to  Procedure(s): ?XI ROBOTIC [place], possible bilateral (Left) as a surgical intervention.  The patient's history has been reviewed, patient examined, no change in status, stable for surgery.  I have reviewed the patient's chart and labs.  Questions were answered to the patient's satisfaction.   ? ? ?[place] ? ? ?

## 2022-03-17 NOTE — Discharge Instructions (Addendum)
Laparoscopic Inguinal Hernia Repair, Adult, Care After The following information offers guidance on how to care for yourself after your procedure. Your health care provider may also give you more specific instructions. If you have problems or questions, contact your health care provider. What can I expect after the procedure? After the procedure, it is common to have: Pain. Swelling and bruising around the incision area. Scrotal swelling, in males. Some fluid or blood draining from your incisions. Follow these instructions at home: Medicines Take over-the-counter and prescription medicines only as told by your health care provider. Ask your health care provider if the medicine prescribed to you: Requires you to avoid driving or using machinery. Can cause constipation. You may need to take these actions to prevent or treat constipation: Drink enough fluid to keep your urine pale yellow. Take over-the-counter or prescription medicines. Eat foods that are high in fiber, such as beans, whole grains, and fresh fruits and vegetables. Limit foods that are high in fat and processed sugars, such as fried or sweet foods. Incision care  Follow instructions from your health care provider about how to take care of your incisions. Make sure you: Wash your hands with soap and water for at least 20 seconds before and after you change your bandage (dressing). If soap and water are not available, use hand sanitizer. Change your dressing as told by your health care provider. Leave stitches (sutures), skin glue, or adhesive strips in place. These skin closures may need to stay in place for 2 weeks or longer. If adhesive strip edges start to loosen and curl up, you may trim the loose edges. Do not remove adhesive strips completely unless your health care provider tells you to do that. Check your incision area every day for signs of infection. Check for: More redness, swelling, or pain. More fluid or  blood. Warmth. Pus or a bad smell. Wear loose, soft clothing while your incisions heal. Managing pain and swelling  If directed, put ice on the painful or swollen areas. To do this: Put ice in a plastic bag. Place a towel between your skin and the bag. Leave the ice on for 20 minutes, 2-3 times a day. Remove the ice if your skin turns bright red. This is very important. If you cannot feel pain, heat, or cold, you have a greater risk of damage to the area.   Activity Do not lift anything that is heavier than 10 lb (4.5 kg), or the limit that you are told, until your health care provider says that it is safe. Ask your health care provider what activities are safe for you. A lot of activity during the first week after surgery can increase pain and swelling. For 1 week after your procedure: Avoid activities that take a lot of effort, such as exercise or sports. You may walk and climb stairs as needed for daily activity, but avoid long walks or climbing stairs for exercise. General instructions If you were given a sedative during the procedure, it can affect you for several hours. Do not drive or operate machinery until your health care provider says that it is safe. Do not take baths, swim, or use a hot tub until your health care provider approves. Ask your health care provider if you may take showers. You may only be allowed to take sponge baths. Do not use any products that contain nicotine or tobacco. These products include cigarettes, chewing tobacco, and vaping devices, such as e-cigarettes. If you need help quitting, ask   care provider. ?Keep all follow-up visits. This is important. ?Contact a health care provider if: ?You have any of these signs of infection: ?More redness, swelling, or pain around your incisions or your groin area. ?More fluid or blood coming from an incision. ?Warmth coming from an incision. ?Pus or a bad smell coming from an incision. ?A fever or chills. ?You have  more swelling in your scrotum, if you are female. ?You have severe pain and medicines do not help. ?You have abdominal pain or swelling. ?You cannot urinate or have a bowel movement. ?You faint or feel dizzy. ?You have nausea and vomiting. ?Get help right away if: ?You have redness, warmth, or pain in your leg. ?You have chest pain. ?You have problems breathing. ?These symptoms may represent a serious problem that is an emergency. Do not wait to see if the symptoms will go away. Get medical help right away. Call your local emergency services (911 in the [place] Do not drive yourself to the hospital. ?Summary ?Pain, swelling, and bruising are common after the procedure. ?Check your incision area every day for signs of infection, such as more redness, swelling, or pain. ?Put ice on painful or swollen areas for 20 minutes, 2-3 times a day. ?This information is not intended to replace advice given to you by your health care provider. Make sure you discuss any questions you have with your health care provider. ?Document Revised: 06/18/2020 Document Reviewed: 06/18/2020 ?Elsevier Patient Education ? [place]. ?   ? ?AMBULATORY SURGERY  ?DISCHARGE INSTRUCTIONS ? ? ?The drugs that you were given will stay in your system until tomorrow so for the next 24 hours you should not: ? ?Drive an automobile ?Make any legal decisions ?Drink any alcoholic beverage ? ? ?You may resume regular meals tomorrow.  Today it is better to start with liquids and gradually work up to solid foods. ? ?You may eat anything you prefer, but it is better to start with liquids, then soup and crackers, and gradually work up to solid foods. ? ? ?Please notify your doctor immediately if you have any unusual bleeding, trouble breathing, redness and pain at the surgery site, drainage, fever, or pain not relieved by medication. ? ? ? ?Additional Instructions: ? ?PLEASE LEAVE GREEN/TEAL ARMBAND FOR 4 DAYS ? ? ? ?Please contact your physician with any  problems or Same Day Surgery at [phone], Monday through Friday 6 am to 4 pm, or [place] [facility] number at [phone].  ?

## 2022-03-17 NOTE — Anesthesia Preprocedure Evaluation (Addendum)
Anesthesia Evaluation  ?Patient identified by MRN, date of birth, ID band ?Patient awake ? ? ? ?Reviewed: ?Allergy & Precautions, NPO status , Patient's Chart, lab work & pertinent test results ? ?History of Anesthesia Complications ?(+) PONV and history of anesthetic complications ? ?Airway ?Mallampati: III ? ?TM Distance: >3 FB ?Neck ROM: full ? ? ? Dental ? ?(+) Chipped, Poor Dentition, Missing ?  ?Pulmonary ?neg shortness of breath, former smoker,  ?  ?Pulmonary exam normal ? ? ? ? ? ? ? Cardiovascular ?Exercise Tolerance: Good ?(-) Past MI negative cardio ROS ?Normal cardiovascular exam ? ? ?  ?Neuro/Psych ?negative neurological ROS ? negative psych ROS  ? GI/Hepatic ?Neg liver ROS, GERD  Controlled,  ?Endo/Other  ?negative endocrine ROS ? Renal/GU ?  ? ?  ?Musculoskeletal ? ? Abdominal ?  ?Peds ? Hematology ?negative hematology ROS ?(+)   ?Anesthesia Other Findings ?Past Medical History: ?No date: Breast cancer ([place]) ?    Comment:  bilateral ?No date: Chronic constipation ?No date: GERD (gastroesophageal reflux disease) ?No date: Osteoarthritis ?No date: Parkinson disease [facility]) ?No date: Personal history of chemotherapy ?No date: Personal history of radiation therapy ?No date: PONV (postoperative nausea and vomiting) ?No date: Psoriatic arthritis ([place]) ?2000: Pulmonary embolism ([place]) ?    Comment:  following mastectomy ? ?Past Surgical History: ?2005: BREAST LUMPECTOMY; Right ?No date: CATARACT EXTRACTION; Bilateral ?2018: INGUINAL HERNIA REPAIR; Right ?2000: MASTECTOMY; Left ?No date: MYOMECTOMY ?No date: TOTAL KNEE ARTHROPLASTY; Left ? ? ? ? Reproductive/Obstetrics ?negative OB ROS ? ?  ? ? ? ? ? ? ? ? ? ? ? ? ? ?  ?  ? ? ? ? ? ? ? ?Anesthesia Physical ?Anesthesia Plan ? ?ASA: 3 ? ?Anesthesia Plan: General ETT  ? ?Post-op Pain Management:   ? ?Induction: Intravenous ? ?PONV Risk Score and Plan: Ondansetron, Dexamethasone, Midazolam and Treatment may vary due to age or  medical condition ? ?Airway Management Planned: Oral ETT ? ?Additional Equipment:  ? ?Intra-op Plan:  ? ?Post-operative Plan: Extubation in OR ? ?Informed Consent: I have reviewed the patients History and Physical, chart, labs and discussed the procedure including the risks, benefits and alternatives for the proposed anesthesia with the patient or authorized representative who has indicated his/her understanding and acceptance.  ? ? ? ?Dental Advisory Given ? ?Plan Discussed with: Anesthesiologist, CRNA and Surgeon ? ?Anesthesia Plan Comments: (Patient consented for risks of anesthesia including but not limited to:  ?- adverse reactions to medications ?- damage to eyes, teeth, lips or other oral mucosa ?- nerve damage due to positioning  ?- sore throat or hoarseness ?- Damage to heart, brain, nerves, lungs, other parts of body or loss of life ? ?Patient voiced understanding.)  ? ? ? ? ? ? ?Anesthesia Quick Evaluation ? ?

## 2022-03-17 NOTE — Anesthesia Postprocedure Evaluation (Signed)
Anesthesia Post Note ? ?Patient: Dorothy Mccoy ? ?Procedure(s) Performed: XI ROBOTIC ASSISTED INGUINAL HERNIA, bilateral (Bilateral) ? ?Patient location during evaluation: PACU ?Anesthesia Type: General ?Level of consciousness: awake and alert ?Pain management: pain level controlled ?Vital Signs Assessment: post-procedure vital signs reviewed and stable ?Respiratory status: spontaneous breathing, nonlabored ventilation, respiratory function stable and patient connected to nasal cannula oxygen ?Cardiovascular status: blood pressure returned to baseline and stable ?Postop Assessment: no apparent nausea or vomiting ?Anesthetic complications: no ? ? ?No notable events documented. ? ? ?Last Vitals:  ?Vitals:  ? 03/17/22 1630 03/17/22 1712  ?BP: (!) 146/65 (!) 151/68  ?Pulse: 81   ?Resp: 14 16  ?Temp: 36.7 ?C 36.4 ?C  ?SpO2: 97% 97%  ?  ?Last Pain:  ?Vitals:  ? 03/17/22 1712  ?TempSrc: Oral  ?PainSc: 5   ? ? ?  ?  ?  ?  ?  ?  ? ?Precious Haws [name] ? ? ? ? ?

## 2022-03-17 NOTE — Transfer of Care (Signed)
Immediate Anesthesia Transfer of Care Note ? ?Patient: Dorothy Mccoy ? ?Procedure(s) Performed: XI ROBOTIC ASSISTED INGUINAL HERNIA, bilateral (Bilateral) ? ?Patient Location: PACU ? ?Anesthesia Type:General ? ?Level of Consciousness: awake, drowsy and patient cooperative ? ?Airway & Oxygen Therapy: Patient Spontanous Breathing and Patient connected to face mask oxygen ? ?Post-op Assessment: Report given to RN and Post -op Vital signs reviewed and stable ? ?Post vital signs: Reviewed and stable ? ?Last Vitals:  ?Vitals Value Taken Time  ?BP 161/55 03/17/22 1548  ?Temp 36.9 ?C 03/17/22 1548  ?Pulse 82 03/17/22 1552  ?Resp 13 03/17/22 1552  ?SpO2 100 % 03/17/22 1552  ?Vitals shown include unvalidated device data. ? ?Last Pain:  ?Vitals:  ? 03/17/22 1235  ?TempSrc: Temporal  ?PainSc: 0-No pain  ?   ? ?  ? ?Complications: No notable events documented. ?

## 2022-03-17 NOTE — Anesthesia Procedure Notes (Signed)
Procedure Name: Intubation ?Date/Time: 03/17/2022 12:19 PM ?Performed by: Kelton Pillar, CRNA ?Pre-anesthesia Checklist: Patient identified, Emergency Drugs available, Suction available and Patient being monitored ?Patient Re-evaluated:Patient Re-evaluated prior to induction ?Oxygen Delivery Method: Circle system utilized ?Preoxygenation: Pre-oxygenation with 100% oxygen ?Induction Type: IV induction ?Ventilation: Mask ventilation without difficulty ?Laryngoscope Size: McGraph and 3 ?Grade View: Grade I ?Tube type: Oral ?Tube size: 6.5 mm ?Number of attempts: 1 ?Airway Equipment and Method: Stylet and Oral airway ?Placement Confirmation: ETT inserted through vocal cords under direct vision, positive ETCO2, breath sounds checked- equal and bilateral and CO2 detector ?Secured at: 22 cm ?Tube secured with: Tape ?Dental Injury: Teeth and Oropharynx as per pre-operative assessment  ? ? ? ? ?

## 2022-03-18 ENCOUNTER — Other Ambulatory Visit: Payer: Self-pay | Admitting: Physician Assistant

## 2022-03-18 MED ORDER — OXYCODONE HCL 5 MG PO TABS: 5.0000 mg | ORAL_TABLET | ORAL | 0 refills | Status: DC | PRN

## 2022-03-18 NOTE — Progress Notes (Signed)
Patient called stating pharmacy does not carry hydrocodone. She was offered Rx being sent to alternate pharmacy but she declined. I call pharmacy to confirm and hydrocodone Rx cancelled. New Rx for '5mg'$  Oxycodone q4 PRN for pain sent electronically to pharmacy. Called pharmacy to confirm this was available. Office staff to update patient.  ? ?-- ?Edison Simon, PA-C ?[facility] ?03/18/2022, 9:44 AM ?M-F: 7am - 4pm ? ?

## 2022-03-31 ENCOUNTER — Encounter: Payer: Self-pay | Admitting: Physician Assistant

## 2022-03-31 ENCOUNTER — Ambulatory Visit (INDEPENDENT_AMBULATORY_CARE_PROVIDER_SITE_OTHER): Payer: Medicare Other | Admitting: Physician Assistant

## 2022-03-31 ENCOUNTER — Other Ambulatory Visit: Payer: Self-pay

## 2022-03-31 VITALS — BP 133/79 | HR 64 | Temp 98.1°F | Ht 69.0 in | Wt 169.0 lb

## 2022-03-31 DIAGNOSIS — K4090 Unilateral inguinal hernia, without obstruction or gangrene, not specified as recurrent: Secondary | ICD-10-CM

## 2022-03-31 DIAGNOSIS — Z09 Encounter for follow-up examination after completed treatment for conditions other than malignant neoplasm: Secondary | ICD-10-CM

## 2022-03-31 NOTE — Patient Instructions (Signed)

## 2022-03-31 NOTE — Progress Notes (Signed)
[facility] POST-OP OFFICE VISIT  03/31/2022  HPI: Dorothy Mccoy is a 71 y.o. female 14 days s/p robotic assisted laparoscopic bilateral inguinal hernia repair with Dr Dahlia Byes  She is doing well She had some soreness initially but this is much improved No longer needing pain medications No fever, chills, nausea, emesis, or bowel changes No issues with incisions No other complaints   Vital signs: BP 133/79   Pulse 64   Temp 98.1 F (36.7 C) (Oral)   Ht '5\' 9"'$  (1.753 m)   Wt 169 lb (76.7 kg)   SpO2 97%   BMI 24.96 kg/m    Physical Exam: Constitutional: Well appearing female, NAD Abdomen: Soft, non-tender, non-distended, no rebound/guarding Skin: Laparoscopic incisions are healing well, no erythema or drainage   Assessment/Plan: This is a 71 y.o. female 14 days s/p robotic assisted laparoscopic bilateral inguinal hernia repair    - Pain control prn  - Reviewed wound care recommendation  - Reviewed lifting restrictions; 6 weeks total  - She can follow up on as needed basis; She understands to call with questions/concerns  -- Edison Simon, PA-C [facility] 03/31/2022, 2:28 PM M-F: 7am - 4pm

## 2022-04-09 ENCOUNTER — Encounter: Payer: Self-pay | Admitting: *Deleted

## 2022-04-10 ENCOUNTER — Ambulatory Visit: Payer: Medicare Other | Admitting: Anesthesiology

## 2022-04-10 ENCOUNTER — Ambulatory Visit
Admission: RE | Admit: 2022-04-10 | Discharge: 2022-04-10 | Disposition: A | Discharge status: Home / Self Care | Payer: Medicare Other | Source: Ambulatory Visit | Attending: Gastroenterology | Admitting: Gastroenterology

## 2022-04-10 ENCOUNTER — Encounter
Admission: RE | Disposition: A | Discharge status: Home / Self Care | Payer: Self-pay | Source: Ambulatory Visit | Attending: Gastroenterology

## 2022-04-10 DIAGNOSIS — Z96652 Presence of left artificial knee joint: Secondary | ICD-10-CM | POA: Diagnosis not present

## 2022-04-10 DIAGNOSIS — Z87891 Personal history of nicotine dependence: Secondary | ICD-10-CM | POA: Insufficient documentation

## 2022-04-10 DIAGNOSIS — K5730 Diverticulosis of large intestine without perforation or abscess without bleeding: Secondary | ICD-10-CM | POA: Diagnosis not present

## 2022-04-10 DIAGNOSIS — Z9012 Acquired absence of left breast and nipple: Secondary | ICD-10-CM | POA: Insufficient documentation

## 2022-04-10 DIAGNOSIS — K640 First degree hemorrhoids: Secondary | ICD-10-CM | POA: Diagnosis not present

## 2022-04-10 DIAGNOSIS — K219 Gastro-esophageal reflux disease without esophagitis: Secondary | ICD-10-CM | POA: Insufficient documentation

## 2022-04-10 DIAGNOSIS — Z86711 Personal history of pulmonary embolism: Secondary | ICD-10-CM | POA: Diagnosis not present

## 2022-04-10 DIAGNOSIS — Z853 Personal history of malignant neoplasm of breast: Secondary | ICD-10-CM | POA: Insufficient documentation

## 2022-04-10 DIAGNOSIS — Z9221 Personal history of antineoplastic chemotherapy: Secondary | ICD-10-CM | POA: Insufficient documentation

## 2022-04-10 DIAGNOSIS — K5900 Constipation, unspecified: Secondary | ICD-10-CM | POA: Insufficient documentation

## 2022-04-10 DIAGNOSIS — Z923 Personal history of irradiation: Secondary | ICD-10-CM | POA: Insufficient documentation

## 2022-04-10 DIAGNOSIS — G20 Parkinson's disease: Secondary | ICD-10-CM | POA: Insufficient documentation

## 2022-04-10 HISTORY — PX: COLONOSCOPY WITH PROPOFOL: SHX5780

## 2022-04-10 SURGERY — COLONOSCOPY WITH PROPOFOL
Anesthesia: General

## 2022-04-10 MED ORDER — PROPOFOL 10 MG/ML IV BOLUS
INTRAVENOUS | Status: DC | PRN
  Administered 2022-04-10 (×2): 20 mg via INTRAVENOUS
  Administered 2022-04-10: 40 mg via INTRAVENOUS
  Administered 2022-04-10: 20 mg via INTRAVENOUS

## 2022-04-10 MED ORDER — MIDAZOLAM HCL 2 MG/2ML IJ SOLN
INTRAMUSCULAR | Status: DC | PRN
  Administered 2022-04-10 (×2): 1 mg via INTRAVENOUS

## 2022-04-10 MED ORDER — LIDOCAINE HCL (PF) 1 % IJ SOLN
INTRAMUSCULAR | Status: AC
  Filled 2022-04-10: qty 2

## 2022-04-10 MED ORDER — PROPOFOL 500 MG/50ML IV EMUL
INTRAVENOUS | Status: DC | PRN
  Administered 2022-04-10: 100 ug/kg/min via INTRAVENOUS

## 2022-04-10 MED ORDER — SODIUM CHLORIDE 0.9 % IV SOLN
INTRAVENOUS | Status: DC
  Administered 2022-04-10: 1000 mL via INTRAVENOUS

## 2022-04-10 MED ORDER — MIDAZOLAM HCL 2 MG/2ML IJ SOLN
INTRAMUSCULAR | Status: AC
  Filled 2022-04-10: qty 2

## 2022-04-10 NOTE — Interval H&P Note (Signed)
History and Physical Interval Note:  04/10/2022 12:32 PM  Dorothy Mccoy  has presented today for surgery, with the diagnosis of CONSTIPATION,HX OF COLONIC POLYPS.  The various methods of treatment have been discussed with the patient and family. After consideration of risks, benefits and other options for treatment, the patient has consented to  Procedure(s): COLONOSCOPY WITH PROPOFOL (N/A) as a surgical intervention.  The patient's history has been reviewed, patient examined, no change in status, stable for surgery.  I have reviewed the patient's chart and labs.  Questions were answered to the patient's satisfaction.     Lesly Rubenstein  Ok to proceed with colonoscopy

## 2022-04-10 NOTE — Anesthesia Preprocedure Evaluation (Signed)
Anesthesia Evaluation  Patient identified by MRN, date of birth, ID band Patient awake    Reviewed: Allergy & Precautions, NPO status , Patient's Chart, lab work & pertinent test results  History of Anesthesia Complications (+) PONV and history of anesthetic complications  Airway Mallampati: III  TM Distance: >3 FB Neck ROM: full    Dental no notable dental hx.    Pulmonary neg shortness of breath, former smoker,    Pulmonary exam normal        Cardiovascular Exercise Tolerance: Good METS: 3 - Mets (-) Past MI Normal cardiovascular exam     Neuro/Psych Parkinsons Dznueropathy  Neuromuscular disease    GI/Hepatic Neg liver ROS, GERD  Controlled,  Endo/Other  negative endocrine ROS  Renal/GU      Musculoskeletal  (+) Arthritis ,   Abdominal Normal abdominal exam  (+)   Peds  Hematology negative hematology ROS (+)   Anesthesia Other Findings Past Medical History: No date: Breast cancer ([place])     Comment:  bilateral No date: Chronic constipation No date: GERD (gastroesophageal reflux disease) No date: Osteoarthritis No date: Parkinson disease ([place]) No date: Personal history of chemotherapy No date: Personal history of radiation therapy No date: PONV (postoperative nausea and vomiting) No date: Psoriatic arthritis ([place]) 2000: Pulmonary embolism ([place])     Comment:  following mastectomy  Past Surgical History: 2005: BREAST LUMPECTOMY; Right No date: CATARACT EXTRACTION; Bilateral 2018: INGUINAL HERNIA REPAIR; Right 2000: MASTECTOMY; Left No date: MYOMECTOMY No date: TOTAL KNEE ARTHROPLASTY; Left     Reproductive/Obstetrics negative OB ROS                             Anesthesia Physical  Anesthesia Plan  ASA: 3  Anesthesia Plan: General   Post-op Pain Management: Minimal or no pain anticipated   Induction: Intravenous  PONV Risk Score and Plan: 4 or greater and  Treatment may vary due to age or medical condition  Airway Management Planned: Natural Airway and Nasal Cannula  Additional Equipment:   Intra-op Plan:   Post-operative Plan:   Informed Consent: I have reviewed the patients History and Physical, chart, labs and discussed the procedure including the risks, benefits and alternatives for the proposed anesthesia with the patient or authorized representative who has indicated his/her understanding and acceptance.     Dental Advisory Given  Plan Discussed with: Anesthesiologist, CRNA and Surgeon  Anesthesia Plan Comments: (Patient consented for risks of anesthesia including but not limited to:  - adverse reactions to medications - damage to eyes, teeth, lips or other oral mucosa - nerve damage due to positioning  - sore throat or hoarseness - Damage to heart, brain, nerves, lungs, other parts of body or loss of life  Patient voiced understanding.)        Anesthesia Quick Evaluation

## 2022-04-10 NOTE — Op Note (Signed)
[facility] Patient Name: Dorothy Mccoy Procedure Date: 04/10/2022 12:43 PM MRN: [id number] Account #: [id number] Date of Birth: October 07, 1951 Admit Type: Outpatient Age: 71 Room: [facility] ENDO ROOM 3 Gender: Female Note Status: Finalized Instrument Name: Peds Colonoscope [id number] Procedure:             Colonoscopy Indications:           Constipation Providers:             Andrey Farmer MD, MD Medicines:             Monitored Anesthesia Care Complications:         No immediate complications. Procedure:             Pre-Anesthesia Assessment:                        - Prior to the procedure, a History and Physical was                         performed, and patient medications and allergies were                         reviewed. The patient is competent. The risks and                         benefits of the procedure and the sedation options and                         risks were discussed with the patient. All questions                         were answered and informed consent was obtained.                         Patient identification and proposed procedure were                         verified by the physician, the nurse, the                         anesthesiologist, the anesthetist and the technician                         in the endoscopy suite. Mental Status Examination:                         alert and oriented. Airway Examination: normal                         oropharyngeal airway and neck mobility. Respiratory                         Examination: clear to auscultation. CV Examination:                         normal. Prophylactic Antibiotics: The patient does not                         require prophylactic antibiotics. Prior  Anticoagulants: The patient has taken no previous                         anticoagulant or antiplatelet agents. ASA Grade                         Assessment: II - A patient with mild systemic disease.                          After reviewing the risks and benefits, the patient                         was deemed in satisfactory condition to undergo the                         procedure. The anesthesia plan was to use monitored                         anesthesia care (MAC). Immediately prior to                         administration of medications, the patient was                         re-assessed for adequacy to receive sedatives. The                         heart rate, respiratory rate, oxygen saturations,                         blood pressure, adequacy of pulmonary ventilation, and                         response to care were monitored throughout the                         procedure. The physical status of the patient was                         re-assessed after the procedure.                        After obtaining informed consent, the colonoscope was                         passed under direct vision. Throughout the procedure,                         the patient's blood pressure, pulse, and oxygen                         saturations were monitored continuously. The                         Colonoscope was introduced through the anus and                         advanced to the the cecum, identified by appendiceal  orifice and ileocecal valve. The colonoscopy was                         performed without difficulty. The patient tolerated                         the procedure well. The quality of the bowel                         preparation was adequate to identify polyps. Findings:      The perianal and digital rectal examinations were normal.      A few small-mouthed diverticula were found in the sigmoid colon and       descending colon.      Internal hemorrhoids were found during retroflexion. The hemorrhoids       were Grade I (internal hemorrhoids that do not prolapse).      The exam was otherwise without abnormality on direct and retroflexion        views. Impression:            - Diverticulosis in the sigmoid colon and in the                         descending colon.                        - Internal hemorrhoids.                        - The examination was otherwise normal on direct and                         retroflexion views.                        - No specimens collected. Recommendation:        - Discharge patient to home.                        - Resume previous diet.                        - Continue present medications.                        - Repeat colonoscopy is not recommended due to current                         age (59 years or older) for surveillance.                        - Return to referring physician as previously                         scheduled. Procedure Code(s):     --- Professional ---                        [phone], Colonoscopy, flexible; diagnostic, including                         collection of specimen(s) by brushing or washing, when  performed (separate procedure) Diagnosis Code(s):     --- Professional ---                        K64.0, First degree hemorrhoids                        K59.00, Constipation, unspecified                        K57.30, Diverticulosis of large intestine without                         perforation or abscess without bleeding CPT copyright 2019 [employer]. All rights reserved. The codes documented in this report are preliminary and upon coder review may  be revised to meet current compliance requirements. Andrey Farmer MD, MD 04/10/2022 1:13:01 PM Number of Addenda: 0 Note Initiated On: 04/10/2022 12:43 PM Scope Withdrawal Time: 0 hours 10 minutes 25 seconds  Total Procedure Duration: 0 hours 18 minutes 30 seconds  Estimated Blood Loss:  Estimated blood loss: none.      [facility]

## 2022-04-10 NOTE — H&P (Signed)
Outpatient short stay form Pre-procedure 04/10/2022  Lesly Rubenstein, MD  Primary Physician: Peggye Form, NP  Reason for visit:  Constipation  History of present illness:    71 y/o lady with history of Parkinson's and recent hernia repair here for colonoscopy for constipation. Last colonoscopy was reportedly normal in 2019. No blood thinners. No other significiant abdominal surgeries besides hernia repair. Denied first degree relatives with colon cancer.    Current Facility-Administered Medications:    0.9 %  sodium chloride infusion, , Intravenous, Continuous, Delanda Bulluck, Hilton Cork, MD, Last Rate: 20 mL/hr at 04/10/22 1219, 1,000 mL at 04/10/22 1219   lidocaine (PF) (XYLOCAINE) 1 % injection, , , ,   Medications Prior to Admission  Medication Sig Dispense Refill Last Dose   Calcium Citrate-Vitamin D3 (CITRACAL MAXIMUM) 315-6.25 MG-MCG TABS Take by mouth.      carbidopa-levodopa (SINEMET CR) 50-200 MG tablet Take 1 tablet by mouth at bedtime.   04/10/2022 at 0730   clonazePAM (KLONOPIN) 0.5 MG tablet Take 0.5 mg by mouth at bedtime as needed for anxiety.      omega-3 acid ethyl esters (LOVAZA) 1 g capsule Take by mouth 2 (two) times daily.      rasagiline (AZILECT) 1 MG TABS tablet Take 1 mg by mouth daily.   04/10/2022 at 0730   tiZANidine (ZANAFLEX) 4 MG capsule Take 4 mg by mouth 3 (three) times daily.      carbidopa-levodopa (SINEMET IR) 25-250 MG tablet Take 1 tablet by mouth 5 (five) times daily.      desoximetasone (TOPICORT) 0.25 % cream Apply 1 application. topically daily as needed (Psoriasis).      diclofenac Sodium (VOLTAREN) 1 % GEL Apply 2 g topically daily as needed (Arthritis).      famotidine-calcium carbonate-magnesium hydroxide (PEPCID COMPLETE) 10-800-165 MG chewable tablet Chew 1 tablet by mouth daily as needed. Uses 3-4 x week      folic acid (FOLVITE) 1 MG tablet Take 1 mg by mouth daily.      gabapentin (NEURONTIN) 300 MG capsule Take 300 mg by mouth at bedtime.       HUMIRA PEN 40 MG/0.4ML PNKT Inject 40 mg into the skin every 14 (fourteen) days.      LINZESS 72 MCG capsule Take 72 mcg by mouth every morning.      methotrexate (RHEUMATREX) 2.5 MG tablet Take 25 mg by mouth once a week.      NEUPRO 8 MG/24HR PT24 Apply 8 mg topically daily.      Probiotic Product (PROBIOTIC PO) Take 1 tablet by mouth daily.      sodium chloride (OCEAN) 0.65 % SOLN nasal spray Place 1 spray into both nostrils as needed for congestion.      vitamin B-12 (CYANOCOBALAMIN) 1000 MCG tablet Take 1,000 mcg by mouth daily.        Allergies  Allergen Reactions   Ciprofloxacin Hcl Nausea Only     Past Medical History:  Diagnosis Date   Breast cancer ([place])    bilateral   Chronic constipation    GERD (gastroesophageal reflux disease)    Osteoarthritis    Parkinson disease (HCC)    Personal history of chemotherapy    Personal history of radiation therapy    PONV (postoperative nausea and vomiting)    Psoriatic arthritis ([place])    Pulmonary embolism ([place]) 2000   following mastectomy    Review of systems:  Otherwise negative.    Physical Exam  Gen: Alert, oriented. Appears  stated age.  HEENT: PERRLA. Lungs: No respiratory distress CV: RRR Abd: soft, benign, no masses Ext: No edema    Planned procedures: Proceed with colonoscopy. The patient understands the nature of the planned procedure, indications, risks, alternatives and potential complications including but not limited to bleeding, infection, perforation, damage to internal organs and possible oversedation/side effects from anesthesia. The patient agrees and gives consent to proceed.  Please refer to procedure notes for findings, recommendations and patient disposition/instructions.     Lesly Rubenstein, MD [facility]

## 2022-04-10 NOTE — Anesthesia Postprocedure Evaluation (Signed)
Anesthesia Post Note  Patient: Dorothy Mccoy  Procedure(s) Performed: COLONOSCOPY WITH PROPOFOL  Patient location during evaluation: Endoscopy Anesthesia Type: General Level of consciousness: awake and alert Pain management: pain level controlled Vital Signs Assessment: post-procedure vital signs reviewed and stable Respiratory status: spontaneous breathing, nonlabored ventilation and respiratory function stable Cardiovascular status: blood pressure returned to baseline and stable Postop Assessment: no apparent nausea or vomiting Anesthetic complications: no   No notable events documented.   Last Vitals:  Vitals:   04/10/22 1153 04/10/22 1310  BP: (!) 177/83   Pulse: 72   Resp: 16   Temp: 37.1 C (!) 35.8 C  SpO2: 100%     Last Pain:  Vitals:   04/10/22 1340  TempSrc:   PainSc: 0-No pain                 [place] Ouch

## 2022-04-10 NOTE — Transfer of Care (Signed)
Immediate Anesthesia Transfer of Care Note  Patient: Dorothy Mccoy  Procedure(s) Performed: COLONOSCOPY WITH PROPOFOL  Patient Location: PACU  Anesthesia Type:General  Level of Consciousness: drowsy  Airway & Oxygen Therapy: Patient Spontanous Breathing and Patient connected to nasal cannula oxygen  Post-op Assessment: Report given to RN, Post -op Vital signs reviewed and stable and Patient moving all extremities  Post vital signs: Reviewed and stable  Last Vitals:  Vitals Value Taken Time  BP 115/56 04/10/22 1311  Temp 35.8 C 04/10/22 1310  Pulse 77 04/10/22 1311  Resp    SpO2 100 % 04/10/22 1311  Vitals shown include unvalidated device data.  Last Pain:  Vitals:   04/10/22 1310  TempSrc: Temporal         Complications: No notable events documented.

## 2022-04-13 ENCOUNTER — Encounter: Payer: Self-pay | Admitting: Gastroenterology

## 2022-09-22 IMAGING — MG MM DIGITAL SCREENING UNILAT*R* W/ TOMO W/ CAD
4 series · 4 of 12 positions shown · non-contrast
Comparison: Previous exam(s).

CLINICAL DATA: Screening.

EXAM:
DIGITAL SCREENING UNILATERAL RIGHT MAMMOGRAM WITH CAD AND
TOMOSYNTHESIS
TECHNIQUE: Right screening digital craniocaudal and mediolateral oblique
mammograms were obtained. Right screening digital breast
tomosynthesis was performed. The images were evaluated with
computer-aided detection.

[R MLO synth-2D]
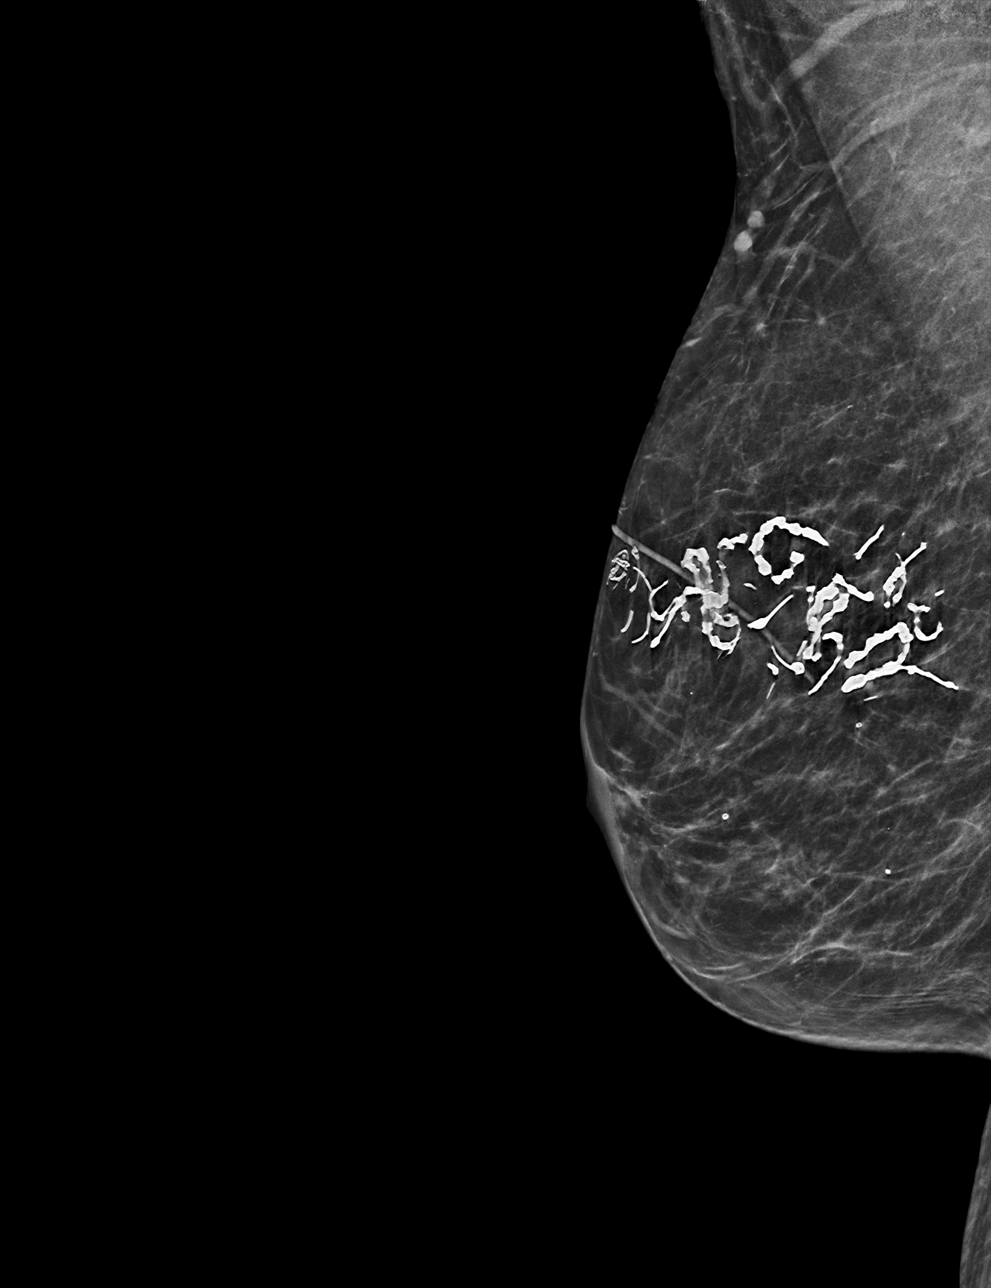

[R CC synth-2D]
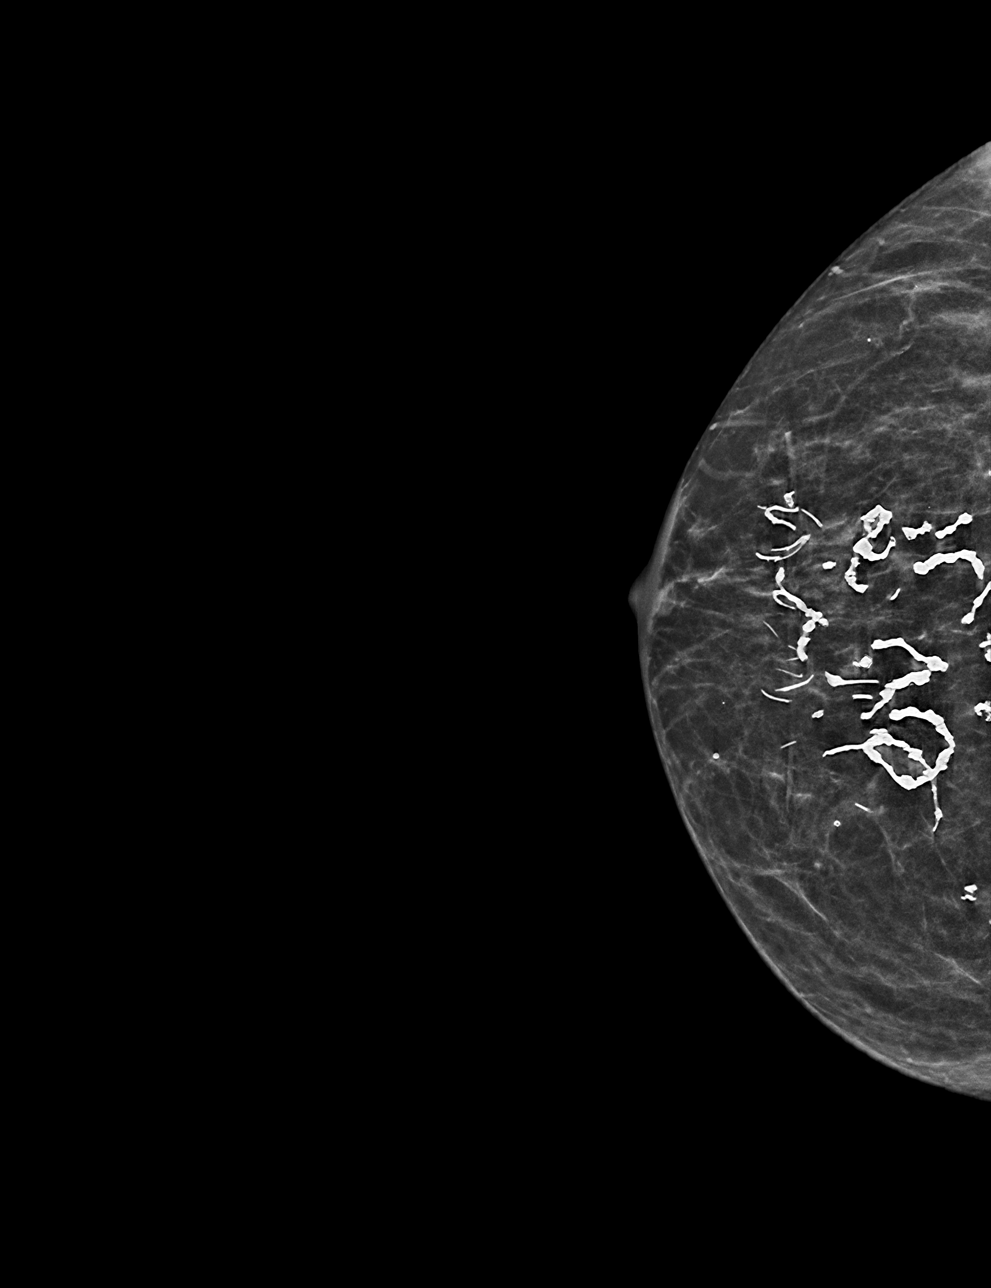

[R MLO tomo · tomo slice 25/50.0]
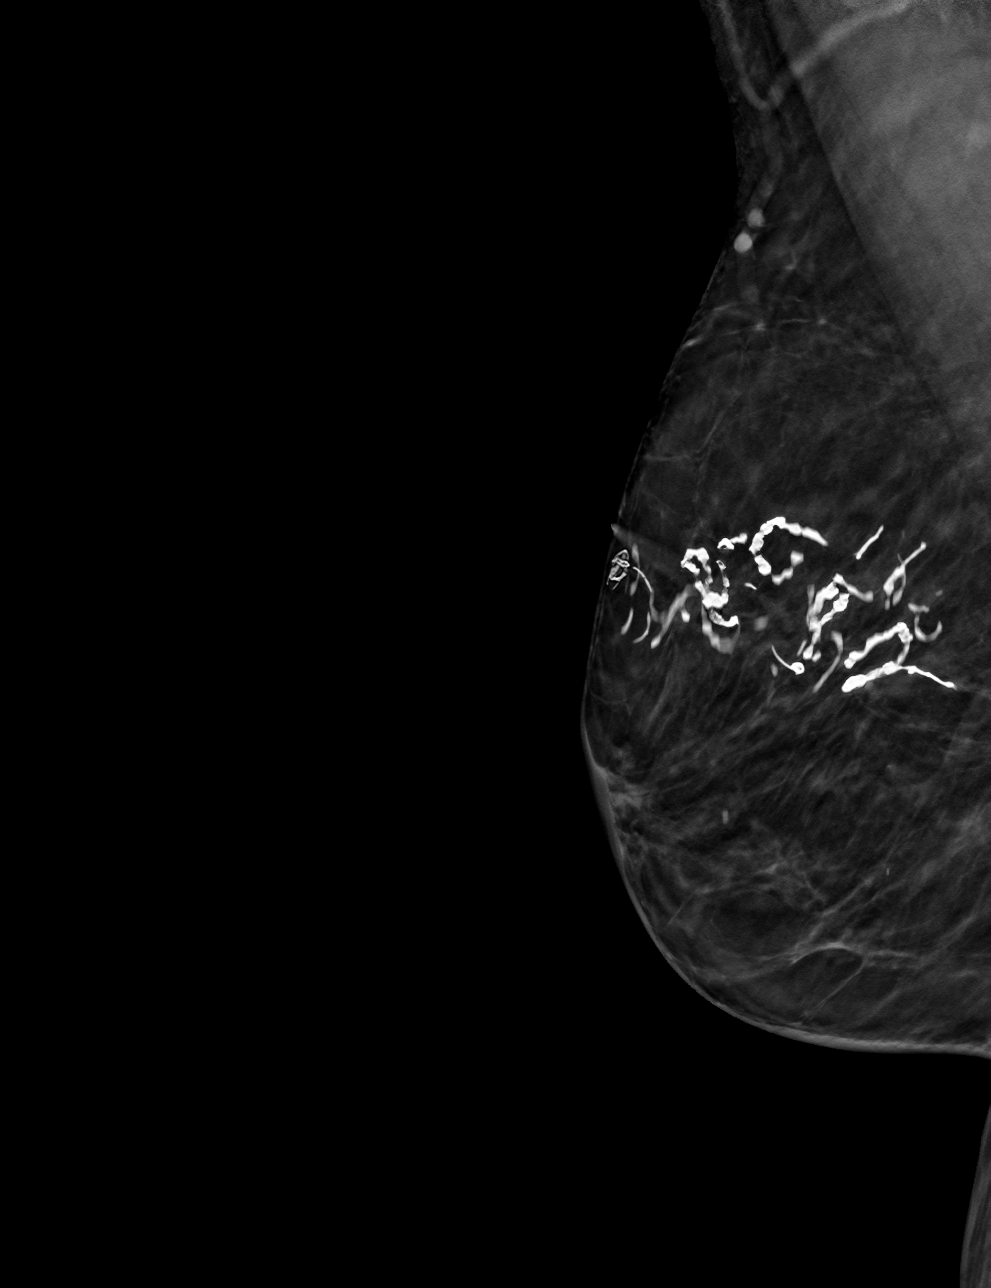

[R CC tomo · tomo slice 21/41.0]
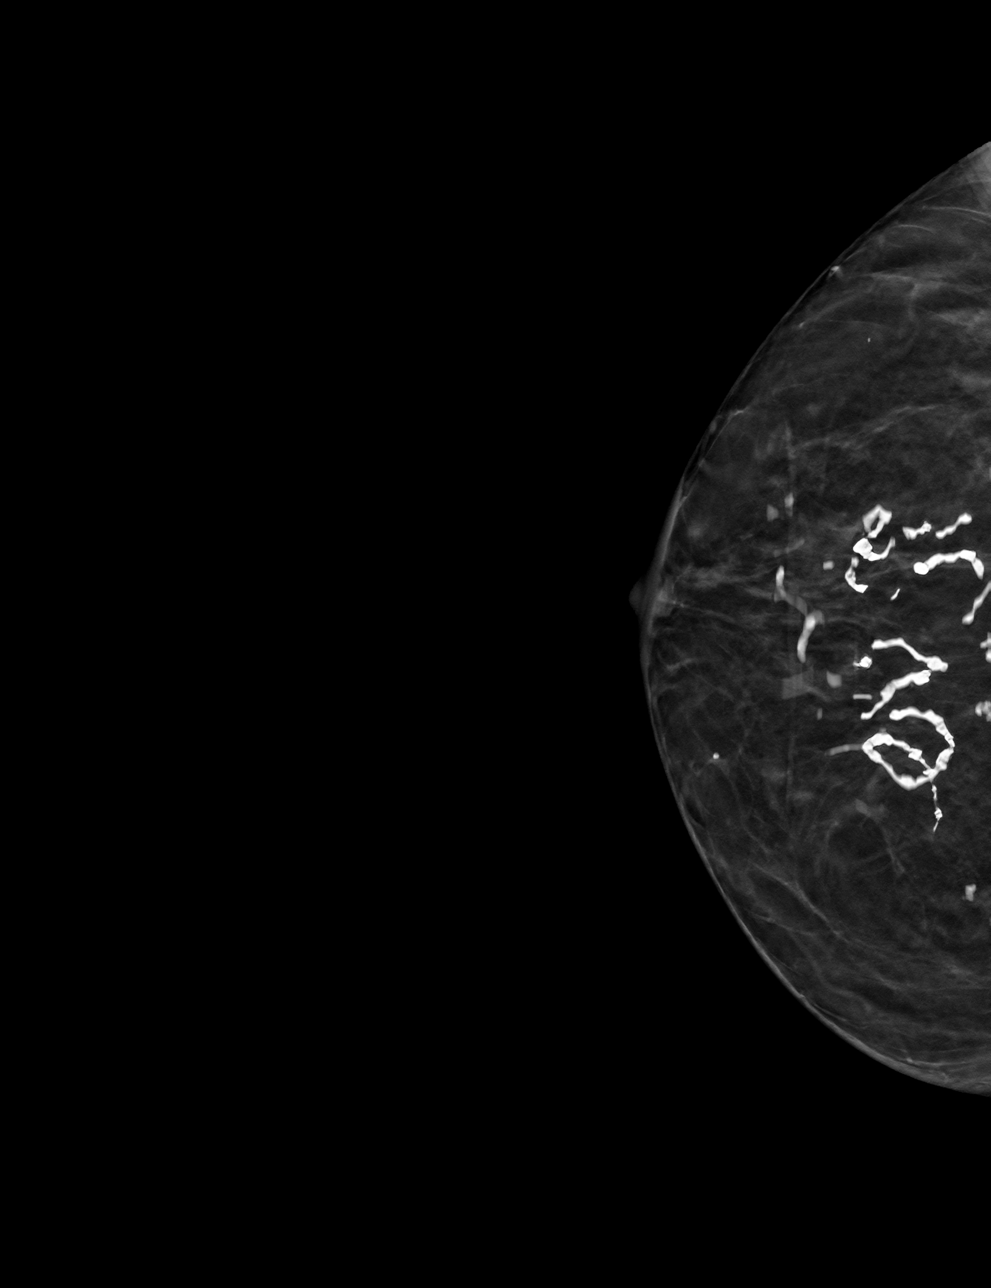

[4 of 12 positions shown; findings below may reference images not displayed]

ACR Breast Density Category b: There are scattered areas of
fibroglandular density.
FINDINGS: The patient has had a left mastectomy. There are no findings
suspicious for malignancy.
IMPRESSION: No mammographic evidence of malignancy. A result letter of this
screening mammogram will be mailed directly to the patient.

RECOMMENDATION:
Screening mammogram in one year.  (Code:NT-E-EGT)

BI-RADS CATEGORY  1: Negative.

## 2022-12-31 ENCOUNTER — Emergency Department: Payer: Medicare Other

## 2022-12-31 ENCOUNTER — Emergency Department
Admission: EM | Admit: 2022-12-31 | Discharge: 2022-12-31 | Disposition: A | Discharge status: Home / Self Care | Payer: Medicare Other | Attending: Emergency Medicine | Admitting: Emergency Medicine

## 2022-12-31 DIAGNOSIS — Z96652 Presence of left artificial knee joint: Secondary | ICD-10-CM | POA: Diagnosis not present

## 2022-12-31 DIAGNOSIS — M660 Rupture of popliteal cyst: Secondary | ICD-10-CM | POA: Diagnosis present

## 2022-12-31 DIAGNOSIS — Z86711 Personal history of pulmonary embolism: Secondary | ICD-10-CM | POA: Diagnosis not present

## 2022-12-31 DIAGNOSIS — M7989 Other specified soft tissue disorders: Secondary | ICD-10-CM | POA: Diagnosis not present

## 2022-12-31 DIAGNOSIS — M25462 Effusion, left knee: Secondary | ICD-10-CM | POA: Insufficient documentation

## 2022-12-31 LAB — COMPREHENSIVE METABOLIC PANEL
ALT: 6 U/L (ref 0–44)
AST: 16 U/L (ref 15–41)
Albumin: 4 g/dL (ref 3.5–5.0)
Alkaline Phosphatase: 59 U/L (ref 38–126)
Anion gap: 9 (ref 5–15)
BUN: 23 mg/dL (ref 8–23)
CO2: 26 mmol/L (ref 22–32)
Calcium: 10.1 mg/dL (ref 8.9–10.3)
Chloride: 107 mmol/L (ref 98–111)
Creatinine, Ser: 0.56 mg/dL (ref 0.44–1.00)
GFR, Estimated: >60 mL/min (ref >60)
Glucose, Bld: 94 mg/dL (ref 70–99)
Potassium: 3.8 mmol/L (ref 3.5–5.1)
Sodium: 142 mmol/L (ref 135–145)
Total Bilirubin: 0.4 mg/dL (ref 0.3–1.2)
Total Protein: 6.5 g/dL (ref 6.5–8.1)

## 2022-12-31 LAB — CBC
HCT: 40.7 % (ref 36.0–46.0)
Hemoglobin: 13.5 g/dL (ref 12.0–15.0)
MCH: 35.2 pg — ABNORMAL HIGH (ref 26.0–34.0)
MCHC: 33.2 g/dL (ref 30.0–36.0)
MCV: 106 fL — ABNORMAL HIGH (ref 80.0–100.0)
Platelets: 239 10*3/uL (ref 150–400)
RBC: 3.84 MIL/uL — ABNORMAL LOW (ref 3.87–5.11)
RDW: 12.6 % (ref 11.5–15.5)
WBC: 4.3 10*3/uL (ref 4.0–10.5)
nRBC: 0 % (ref 0.0–0.2)

## 2022-12-31 LAB — PROTIME-INR
INR: 1.1 (ref 0.8–1.2)
Prothrombin Time: 14.4 seconds (ref 11.4–15.2)

## 2022-12-31 MED ORDER — ACETAMINOPHEN 500 MG PO TABS
1000.0000 mg | ORAL_TABLET | Freq: Once | ORAL | Status: AC
  Administered 2022-12-31: 1000 mg via ORAL
  Filled 2022-12-31: qty 2

## 2022-12-31 NOTE — ED Notes (Signed)
Pt with swelling to left leg, concerned for blood clot. Pt with edema to left ankle, calf and knee.

## 2022-12-31 NOTE — ED Notes (Signed)
First Nurse Note: Patient sent to ED from [facility] for left lower leg swelling since Saturday. Concerned for possible DVT. Hx of PE.

## 2022-12-31 NOTE — Discharge Instructions (Addendum)
You can take Tylenol 1 g every 8 hours and ibuprofen 400-600 every 8 hours with food to help with pain.  Try to keep it wrapped and use a compression stocking.  Keep it elevated up over your heart.  You can use ice.  Return to the ER if you develop fevers redness difficulty moving your knee or any other concerns otherwise call your orthopedic doctor to make follow-up.  IMPRESSION: 1. Mild lucency along the lateral tibial tray component (zones 3 and 4) and along the Methacrylate-bone interface along the femoral component (lateral zone 5), suggesting mild loosening. 2. Small knee effusion.  IMPRESSION: 1. No evidence of left lower extremity DVT. 2. Probable Baker's cyst dissecting into the upper calf.

## 2022-12-31 NOTE — ED Provider Notes (Signed)
[facility] Provider Note    Event Date/Time   First MD Initiated Contact with Patient 12/31/22 1507     (approximate)   History   Leg Swelling   HPI  Dorothy Mccoy is a 72 y.o. female with history of PE who comes in with left leg swelling for the past 5 days.  She has had a prior left knee replacement that was in 2022.  She reports having 5 days of swelling and pain.  Patient was sent here for DVT rule out.  Patient reports that she noticed this a few days ago has not been taking anything to help with that.  She denies any chest pain shortness of breath falls or any other concerns.   Physical Exam   Triage Vital Signs: ED Triage Vitals [12/31/22 1440]  Enc Vitals Group     BP      Pulse      Resp      Temp 97.7 F (36.5 C)     Temp Source Oral     SpO2      Weight 170 lb (77.1 kg)     Height      Head Circumference      Peak Flow      Pain Score 0     Pain Loc      Pain Edu?      Excl. in [place]?     Most recent vital signs: Vitals:   12/31/22 1440  Temp: 97.7 F (36.5 C)     General: Awake, no distress.  CV:  Good peripheral perfusion.  Resp:  Normal effort.  Abd:  No distention.  Other:  Good distal pulse.  She has got 1+ edema noted in her left leg.  She is able to range her knee.  She has no warmth or erythema noted.  Able to flex and extend her ankle.  Is able to bear weight on her leg.  She is able to flex and extend her ankle.   ED Results / Procedures / Treatments   Labs (all labs ordered are listed, but only abnormal results are displayed) Labs Reviewed  CBC - Abnormal; Notable for the following components:      Result Value   RBC 3.84 (*)    MCV 106.0 (*)    MCH 35.2 (*)    All other components within normal limits  COMPREHENSIVE METABOLIC PANEL  PROTIME-INR      RADIOLOGY I have reviewed the xray personally and interpreted and no fractures   PROCEDURES:  Critical Care performed:  No  Procedures   MEDICATIONS ORDERED IN ED: Medications - No data to display   IMPRESSION / MDM / [place] / ED COURSE  I reviewed the triage vital signs and the nursing notes.   Patient's presentation is most consistent with acute presentation with potential threat to life or bodily function.   Differential is DVT versus Baker's cyst versus CHF.  Doubt CHF given unilateral leg and no shortness of breath.  Liver test were done to make sure no evidence of hepatic failure or kidney dysfunction.  Ultrasound ordered evaluate for DVT good distal pulses unlikely arterial issue.  INR was no normal.  CBC shows normal white count normal hemoglobin.  CMP is reassuring.  Discussed with patient findings on x-ray.  Given the mild lucency she can follow this up with Dr. Harlow Mares who is her orthopedic surgeon.  She does have a small knee effusion but no  evidence of septic joint at this time.  The ultrasound did not show DVT but did show probable Baker's cyst with rupture I suspect this is what is causing her leg to look swollen.  Patient does report having a prior Baker's cyst on the other side.  We discussed control of pain with Tylenol, ibuprofen rotation.  He is going to use her walker to help with ambulation when she typically uses a cane but she is able to bear weight.  And she will follow-up outpatient with Dr. Harlow Mares.  We discussed compression elevation and icing     FINAL CLINICAL IMPRESSION(S) / ED DIAGNOSES   Final diagnoses:  Ruptured Bakers cyst     Rx / DC Orders   ED Discharge Orders     None        Note:  This document was prepared using Dragon voice recognition software and may include unintentional dictation errors.   Vanessa , MD 12/31/22 [phone]

## 2022-12-31 NOTE — ED Triage Notes (Signed)
Pt sts that she is having left leg pain with swelling. Pt sts that she has been having this pain since Saturday. Pt sts that the pain in in the calf and knee. Pt denies any pain while resting.

## 2023-01-22 ENCOUNTER — Other Ambulatory Visit: Payer: Self-pay | Admitting: Family Medicine

## 2023-01-22 DIAGNOSIS — Z1231 Encounter for screening mammogram for malignant neoplasm of breast: Secondary | ICD-10-CM

## 2023-01-25 ENCOUNTER — Encounter (INDEPENDENT_AMBULATORY_CARE_PROVIDER_SITE_OTHER): Payer: Self-pay | Admitting: Vascular Surgery

## 2023-01-25 ENCOUNTER — Ambulatory Visit (INDEPENDENT_AMBULATORY_CARE_PROVIDER_SITE_OTHER): Payer: Medicare Other | Admitting: Vascular Surgery

## 2023-01-25 VITALS — BP 124/73 | HR 74 | Resp 18 | Ht 69.0 in | Wt 166.4 lb

## 2023-01-25 DIAGNOSIS — Z86711 Personal history of pulmonary embolism: Secondary | ICD-10-CM

## 2023-01-25 DIAGNOSIS — K219 Gastro-esophageal reflux disease without esophagitis: Secondary | ICD-10-CM

## 2023-01-25 DIAGNOSIS — M7989 Other specified soft tissue disorders: Secondary | ICD-10-CM | POA: Insufficient documentation

## 2023-01-25 DIAGNOSIS — M1712 Unilateral primary osteoarthritis, left knee: Secondary | ICD-10-CM | POA: Diagnosis not present

## 2023-01-25 DIAGNOSIS — M79662 Pain in left lower leg: Secondary | ICD-10-CM

## 2023-01-25 NOTE — Progress Notes (Signed)
MRN : [id number]  Dorothy Mccoy is a 72 y.o. ([birth year]) female who presents with chief complaint of legs swell.  History of Present Illness:   I am asked to see Dorothy Mccoy by Dr. Harlow Mares for painful swelling of her left lower extremity.  As per the patient late last month she experienced fairly abrupt onset of left leg swelling.  It was painful and she was concerned for a blood clot which prompted a trip to the emergency room.  Duplex ultrasound dated December 31, 2022 was negative for DVT however a large Baker's cyst was identified measuring 10 cm x 2.4 cm x 1.5 cm.  She has had a total knee replacement on that side, knee replacement was performed in 2022.  Over the course of 3 to 4 days she notes that the swelling diminished markedly as did the discomfort.  Today she notes she still has some swelling and is still somewhat uncomfortable but not like it was.  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  At the time of her evaluation concerns were raised that her swelling could be associated with varicose veins.  Current Meds  Medication Sig   Calcium Citrate-Vitamin D3 (CITRACAL MAXIMUM) 315-6.25 MG-MCG TABS Take by mouth.   carbidopa-levodopa (SINEMET CR) 50-200 MG tablet Take 1 tablet by mouth at bedtime.   carbidopa-levodopa (SINEMET IR) 25-250 MG tablet Take 1 tablet by mouth 5 (five) times daily.   clonazePAM (KLONOPIN) 0.5 MG tablet Take 0.5 mg by mouth at bedtime as needed for anxiety.   desoximetasone (TOPICORT) 0.25 % cream Apply 1 application. topically daily as needed (Psoriasis).   diclofenac Sodium (VOLTAREN) 1 % GEL Apply 2 g topically daily as needed (Arthritis).   famotidine-calcium carbonate-magnesium hydroxide (PEPCID COMPLETE) 10-800-165 MG chewable tablet Chew 1 tablet by mouth daily as needed. Uses 3-4 x week   folic acid (FOLVITE) 1 MG tablet Take 1 mg by mouth daily.   gabapentin (NEURONTIN) 300 MG capsule Take 300 mg by mouth at bedtime.   HUMIRA PEN 40 MG/0.4ML PNKT  Inject 40 mg into the skin every 14 (fourteen) days.   LINZESS 72 MCG capsule Take 72 mcg by mouth every morning.   methotrexate (RHEUMATREX) 2.5 MG tablet Take 25 mg by mouth once a week.   NEUPRO 8 MG/24HR PT24 Apply 8 mg topically daily.   omega-3 acid ethyl esters (LOVAZA) 1 g capsule Take by mouth 2 (two) times daily.   Probiotic Product (PROBIOTIC PO) Take 1 tablet by mouth daily.   rasagiline (AZILECT) 1 MG TABS tablet Take 1 mg by mouth daily.   sodium chloride (OCEAN) 0.65 % SOLN nasal spray Place 1 spray into both nostrils as needed for congestion.   tiZANidine (ZANAFLEX) 4 MG capsule Take 4 mg by mouth 3 (three) times daily.   vitamin B-12 (CYANOCOBALAMIN) 1000 MCG tablet Take 1,000 mcg by mouth daily.    Past Medical History:  Diagnosis Date   Breast cancer ([place])    bilateral   Chronic constipation    GERD (gastroesophageal reflux disease)    Osteoarthritis    Parkinson disease    Personal history of chemotherapy    Personal history of radiation therapy    PONV (postoperative nausea and vomiting)    Psoriatic arthritis ([place])    Pulmonary embolism ([place]) 2000   following mastectomy    Past Surgical History:  Procedure Laterality Date   BREAST LUMPECTOMY Right 2005   CATARACT EXTRACTION Bilateral  COLONOSCOPY WITH PROPOFOL N/A 04/10/2022   Procedure: COLONOSCOPY WITH PROPOFOL;  Surgeon: Lesly Rubenstein, MD;  Location: [facility] ENDOSCOPY;  Service: Endoscopy;  Laterality: N/A;   INGUINAL HERNIA REPAIR Right 2018   MASTECTOMY Left 2000   MYOMECTOMY     TOTAL KNEE ARTHROPLASTY Left     Social History Social History   Tobacco Use   Smoking status: Former    Types: Cigarettes   Smokeless tobacco: Never  Vaping Use   Vaping Use: Never used  Substance Use Topics   Alcohol use: Yes    Comment: rare   Drug use: Never    Family History History reviewed. No pertinent family history.  Allergies  Allergen Reactions   Ciprofloxacin Hcl Nausea Only     REVIEW  OF SYSTEMS (Negative unless checked)  Constitutional: [] Weight loss  [] Fever  [] Chills Cardiac: [] Chest pain   [] Chest pressure   [] Palpitations   [] Shortness of breath when laying flat   [] Shortness of breath with exertion. Vascular:  [] Pain in legs with walking   [x] Pain in legs with standing  [x] History of DVT   [] Phlebitis   [x] Swelling in legs   [] Varicose veins   [] Non-healing ulcers Pulmonary:   [] Uses home oxygen   [] Productive cough   [] Hemoptysis   [] Wheeze  [] COPD   [] Asthma Neurologic:  [] Dizziness   [] Seizures   [] History of stroke   [] History of TIA  [] Aphasia   [] Vissual changes   [] Weakness or numbness in arm   [] Weakness or numbness in leg Musculoskeletal:   [] Joint swelling   [] Joint pain   [] Low back pain Hematologic:  [] Easy bruising  [] Easy bleeding   [] Hypercoagulable state   [] Anemic Gastrointestinal:  [] Diarrhea   [] Vomiting  [] Gastroesophageal reflux/heartburn   [] Difficulty swallowing. Genitourinary:  [] Chronic kidney disease   [] Difficult urination  [] Frequent urination   [] Blood in urine Skin:  [] Rashes   [] Ulcers  Psychological:  [] History of anxiety   []  History of major depression.  Physical Examination  Vitals:   01/25/23 1312  BP: 124/73  Pulse: 74  Resp: 18  Weight: 166 lb 6.4 oz (75.5 kg)  Height: 5\' 9"  (1.753 m)   Body mass index is 24.57 kg/m. Gen: WD/WN, NAD Head: [place]/AT, No temporalis wasting.  Ear/Nose/Throat: Hearing grossly intact, nares w/o erythema or drainage, pinna without lesions Eyes: PER, EOMI, sclera nonicteric.  Neck: Supple, no gross masses.  No JVD.  Pulmonary:  Good air movement, no audible wheezing, no use of accessory muscles.  Cardiac: RRR, precordium not hyperdynamic. Vascular:  scattered varicosities present bilaterally.  Mild venous stasis changes to the legs bilaterally.  2-3+ soft pitting edema, CEAP [id number]  Vessel Right Left  Radial Palpable Palpable  Gastrointestinal: soft, non-distended. No guarding/no peritoneal  signs.  Musculoskeletal: M/S 5/5 throughout.  No deformity.  Neurologic: CN 2-12 intact. Pain and light touch intact in extremities.  Symmetrical.  Speech is fluent. Motor exam as listed above. Psychiatric: Judgment intact, Mood & affect appropriate for pt's clinical situation. Dermatologic: Venous rashes no ulcers noted.  No changes consistent with cellulitis. Lymph : No lichenification or skin changes of chronic lymphedema.  CBC Lab Results  Component Value Date   WBC 4.3 12/31/2022   HGB 13.5 12/31/2022   HCT 40.7 12/31/2022   MCV 106.0 (H) 12/31/2022   PLT 239 12/31/2022    BMET    Component Value Date/Time   NA 142 12/31/2022 1443   K 3.8 12/31/2022 1443   CL 107 12/31/2022 1443  CO2 26 12/31/2022 1443   GLUCOSE 94 12/31/2022 1443   BUN 23 12/31/2022 1443   CREATININE 0.56 12/31/2022 1443   CALCIUM 10.1 12/31/2022 1443   GFRNONAA >60 12/31/2022 1443   CrCl cannot be calculated (Patient's most recent lab result is older than the maximum 21 days allowed.).  COAG Lab Results  Component Value Date   INR 1.1 12/31/2022    Radiology DG Knee Complete 4 Views Left  Result Date: 12/31/2022 CLINICAL DATA:  Knee pain EXAM: LEFT KNEE - COMPLETE 4+ VIEW COMPARISON:  None Available. FINDINGS: Total knee prosthesis observed, with proximally 1.5 mm lucency along the lateral tibial tray component on the frontal projection (frontal zones 3 and 4). Equivocal/minimal lucency along the methacrylate-bone interface along the femoral component in zone 5. The appearance suggests mild findings of loosening. Small knee effusion noted. IMPRESSION: 1. Mild lucency along the lateral tibial tray component (zones 3 and 4) and along the Methacrylate-bone interface along the femoral component (lateral zone 5), suggesting mild loosening. 2. Small knee effusion. Electronically Signed   By: Van Clines M.D.   On: 12/31/2022 17:16   US Venous Img Lower Unilateral Left  Result Date:  12/31/2022 CLINICAL DATA:  Left lower extremity swelling for 5 days. EXAM: LEFT LOWER EXTREMITY VENOUS DOPPLER ULTRASOUND TECHNIQUE: Gray-scale sonography with compression, as well as color and duplex ultrasound, were performed to evaluate the deep venous system(s) from the level of the common femoral vein through the popliteal and proximal calf veins. COMPARISON:  None Available. FINDINGS: VENOUS Normal compressibility of the common femoral, superficial femoral, and popliteal veins, as well as the visualized calf veins. Visualized portions of profunda femoral vein and great saphenous vein unremarkable. No filling defects to suggest DVT on grayscale or color Doppler imaging. Doppler waveforms show normal direction of venous flow, normal respiratory plasticity and response to augmentation. Limited views of the contralateral common femoral vein are unremarkable. OTHER [place]cent[place] avascular fluid collection in the popliteal fossa extends into the upper calf measuring approximately 10 x 1.5 x 2.4 cm. Limitations: none IMPRESSION: 1. No evidence of left lower extremity DVT. 2. Probable Baker's cyst dissecting into the upper calf. Electronically Signed   By: Keith Rake M.D.   On: 12/31/2022 16:14     Assessment/Plan 1. Pain and swelling of left lower leg Recommend:  I have had a long discussion with the patient regarding swelling and why it  causes symptoms.  Patient will begin wearing graduated compression on a daily basis a prescription was given. The patient will  wear the stockings first thing in the morning and removing them in the evening. The patient is instructed specifically not to sleep in the stockings.   In addition, behavioral modification will be initiated.  This will include frequent elevation, use of over the counter pain medications and exercise such as walking.  Consideration for a lymph pump will also be made based upon the effectiveness of conservative therapy.  This would help to  improve the edema control and prevent sequela such as ulcers and infections   Patient should undergo duplex ultrasound of the venous system to ensure that DVT or reflux is not present.  The patient will follow-up with me after the ultrasound.   - VAS [place] LOWER EXTREMITY VENOUS REFLUX; Future  2. History of pulmonary embolus (PE) Recommend:   No surgery or intervention at this point in time.  IVC filter is not indicated at present.  The patient has completed anticoagulation.  Elevation was stressed,  use of a recliner was discussed.  I have had a long discussion with the patient regarding DVT and post phlebitic changes such as swelling and why it  causes symptoms such as pain.  The patient will wear graduated compression stockings class 1 (20-30 mmHg), beginning after three full days of anticoagulation, on a daily basis a prescription was given. The patient will  beginning wearing the stockings first thing in the morning and removing them in the evening. The patient is instructed specifically not to sleep in the stockings.  In addition, behavioral modification including elevation during the day and avoidance of prolonged dependency will be initiated.    The patient will continue anticoagulation for now as there have not been any problems or complications from his anticoagulation therapy at this point.    3. Gastroesophageal reflux disease, unspecified whether esophagitis present Continue PPI as already ordered, this medication has been reviewed and there are no changes at this time.  Avoidence of caffeine and alcohol  Moderate elevation of the head of the bed   4. Arthritis of left knee Continue NSAID medications as already ordered, these medications have been reviewed and there are no changes at this time.  Continued activity and therapy was stressed.    Hortencia Pilar, MD  01/25/2023 1:17 PM

## 2023-01-26 NOTE — Progress Notes (Signed)
MRN : [id number]  Dorothy Mccoy is a 72 y.o. (09-18-1951) female who presents with chief complaint of legs hurt and swell.  History of Present Illness:   I am asked to see Dorothy Mccoy by Dr. Harlow Mares for painful swelling of her left lower extremity.  As per the patient late last month she experienced fairly abrupt onset of left leg swelling.  It was painful and she was concerned for a blood clot which prompted a trip to the emergency room.  Duplex ultrasound dated December 31, 2022 was negative for DVT however a large Baker's cyst was identified measuring 10 cm x 2.4 cm x 1.5 cm.  She has had a total knee replacement on that side, knee replacement was performed in 2022.  Over the course of 3 to 4 days she notes that the swelling diminished markedly as did the discomfort.  Today she notes she still has some swelling and is still somewhat uncomfortable but not like it was.  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  At the time of her evaluation concerns were raised that her swelling could be associated with varicose veins.   Today she notes her symptoms have essentially resolved.  She denies complaints regarding her left lower extremity.  Duplex ultrasound of the venous system of the left lower extremity done today demonstrates a normal deep venous system.  There is trivial reflux noted in the mid calf.  No outpatient medications have been marked as taking for the 01/28/23 encounter (Appointment) with Dorothy Mccoy, Dorothy Lory, MD.    Past Medical History:  Diagnosis Date   Breast cancer [facility])    bilateral   Chronic constipation    GERD (gastroesophageal reflux disease)    Osteoarthritis    Parkinson disease    Personal history of chemotherapy    Personal history of radiation therapy    PONV (postoperative nausea and vomiting)    Psoriatic arthritis ([place])    Pulmonary embolism ([place]) 2000   following mastectomy    Past Surgical History:  Procedure Laterality Date   BREAST LUMPECTOMY Right  2005   CATARACT EXTRACTION Bilateral    COLONOSCOPY WITH PROPOFOL N/A 04/10/2022   Procedure: COLONOSCOPY WITH PROPOFOL;  Surgeon: Lesly Rubenstein, MD;  Location: [facility] ENDOSCOPY;  Service: Endoscopy;  Laterality: N/A;   INGUINAL HERNIA REPAIR Right 2018   MASTECTOMY Left 2000   MYOMECTOMY     TOTAL KNEE ARTHROPLASTY Left     Social History Social History   Tobacco Use   Smoking status: Former    Types: Cigarettes   Smokeless tobacco: Never  Vaping Use   Vaping Use: Never used  Substance Use Topics   Alcohol use: Yes    Comment: rare   Drug use: Never    Family History No family history on file.  Allergies  Allergen Reactions   Ciprofloxacin Hcl Nausea Only     REVIEW OF SYSTEMS (Negative unless checked)  Constitutional: [] Weight loss  [] Fever  [] Chills Cardiac: [] Chest pain   [] Chest pressure   [] Palpitations   [] Shortness of breath when laying flat   [] Shortness of breath with exertion. Vascular:  [] Pain in legs with walking   [x] Pain in legs at rest  [x] History of PE   [] Phlebitis   [x] Swelling in legs   [] Varicose veins   [] Non-healing ulcers Pulmonary:   [] Uses home oxygen   [] Productive cough   [] Hemoptysis   [] Wheeze  [] COPD   []   Asthma Neurologic:  [] Dizziness   [] Seizures   [] History of stroke   [] History of TIA  [] Aphasia   [] Vissual changes   [] Weakness or numbness in arm   [] Weakness or numbness in leg Musculoskeletal:   [] Joint swelling   [x] Joint pain   [] Low back pain Hematologic:  [] Easy bruising  [] Easy bleeding   [] Hypercoagulable state   [] Anemic Gastrointestinal:  [] Diarrhea   [] Vomiting  [x] Gastroesophageal reflux/heartburn   [] Difficulty swallowing. Genitourinary:  [] Chronic kidney disease   [] Difficult urination  [] Frequent urination   [] Blood in urine Skin:  [] Rashes   [] Ulcers  Psychological:  [] History of anxiety   []  History of major depression.  Physical Examination  There were no vitals filed for this visit. There is no height or weight  on file to calculate BMI. Gen: WD/WN, NAD Head: [place]/AT, No temporalis wasting.  Ear/Nose/Throat: Hearing grossly intact, nares w/o erythema or drainage, pinna without lesions Eyes: PER, EOMI, sclera nonicteric.  Neck: Supple, no gross masses.  No JVD.  Pulmonary:  Good air movement, no audible wheezing, no use of accessory muscles.  Cardiac: RRR, precordium not hyperdynamic. Vascular:  scattered varicosities present bilaterally.  Mild venous stasis changes to the legs bilaterally.  trace soft pitting edema. CEAP [id number]   Vessel Right Left  Radial Palpable Palpable  Gastrointestinal: soft, non-distended. No guarding/no peritoneal signs.  Musculoskeletal: M/S 5/5 throughout.  No deformity.  Neurologic: CN 2-12 intact. Pain and light touch intact in extremities.  Symmetrical.  Speech is fluent. Motor exam as listed above. Psychiatric: Judgment intact, Mood & affect appropriate for pt's clinical situation. Dermatologic: Venous rashes no ulcers noted.  No changes consistent with cellulitis. Lymph : No lichenification or skin changes of chronic lymphedema.  CBC Lab Results  Component Value Date   WBC 4.3 12/31/2022   HGB 13.5 12/31/2022   HCT 40.7 12/31/2022   MCV 106.0 (H) 12/31/2022   PLT 239 12/31/2022    BMET    Component Value Date/Time   NA 142 12/31/2022 1443   K 3.8 12/31/2022 1443   CL 107 12/31/2022 1443   CO2 26 12/31/2022 1443   GLUCOSE 94 12/31/2022 1443   BUN 23 12/31/2022 1443   CREATININE 0.56 12/31/2022 1443   CALCIUM 10.1 12/31/2022 1443   GFRNONAA >60 12/31/2022 1443   CrCl cannot be calculated (Patient's most recent lab result is older than the maximum 21 days allowed.).  COAG Lab Results  Component Value Date   INR 1.1 12/31/2022    Radiology DG Knee Complete 4 Views Left  Result Date: 12/31/2022 CLINICAL DATA:  Knee pain EXAM: LEFT KNEE - COMPLETE 4+ VIEW COMPARISON:  None Available. FINDINGS: Total knee prosthesis observed, with proximally 1.5 mm  lucency along the lateral tibial tray component on the frontal projection (frontal zones 3 and 4). Equivocal/minimal lucency along the methacrylate-bone interface along the femoral component in zone 5. The appearance suggests mild findings of loosening. Small knee effusion noted. IMPRESSION: 1. Mild lucency along the lateral tibial tray component (zones 3 and 4) and along the Methacrylate-bone interface along the femoral component (lateral zone 5), suggesting mild loosening. 2. Small knee effusion. Electronically Signed   By: Van Clines M.D.   On: 12/31/2022 17:16   US Venous Img Lower Unilateral Left  Result Date: 12/31/2022 CLINICAL DATA:  Left lower extremity swelling for 5 days. EXAM: LEFT LOWER EXTREMITY VENOUS DOPPLER ULTRASOUND TECHNIQUE: Gray-scale sonography with compression, as well as color and duplex ultrasound, were performed to evaluate the deep  venous system(s) from the level of the common femoral vein through the popliteal and proximal calf veins. COMPARISON:  None Available. FINDINGS: VENOUS Normal compressibility of the common femoral, superficial femoral, and popliteal veins, as well as the visualized calf veins. Visualized portions of profunda femoral vein and great saphenous vein unremarkable. No filling defects to suggest DVT on grayscale or color Doppler imaging. Doppler waveforms show normal direction of venous flow, normal respiratory plasticity and response to augmentation. Limited views of the contralateral common femoral vein are unremarkable. OTHER Crescentic avascular fluid collection in the popliteal fossa extends into the upper calf measuring approximately 10 x 1.5 x 2.4 cm. Limitations: none IMPRESSION: 1. No evidence of left lower extremity DVT. 2. Probable Baker's cyst dissecting into the upper calf. Electronically Signed   By: Keith Rake M.D.   On: 12/31/2022 16:14     Assessment/Plan 1. Pain and swelling of left lower leg Recommend:  No surgery or  intervention at this point in time.  IVC filter is not indicated at present.  The patient has completed anticoagulation   Elevation was stressed, use of a recliner was discussed.  I recommend the patient should wear graduated compression stockings class 1 (20-30 mmHg), beginning after three full days of anticoagulation, on a daily basis.  Exercise is strongly encouraged.  The patient will follow up with me PRN should anything change.  The patient voices agreement with this plan.  2. History of pulmonary embolus (PE) Recommend:  No surgery or intervention at this point in time.  IVC filter is not indicated at present.  The patient has completed anticoagulation   Elevation was stressed, use of a recliner was discussed.  I recommend the patient should wear graduated compression stockings class 1 (20-30 mmHg), beginning after three full days of anticoagulation, on a daily basis.  Exercise is strongly encouraged.  The patient will follow up with me PRN should anything change.  The patient voices agreement with this plan.  3. Gastroesophageal reflux disease, unspecified whether esophagitis present Continue PPI as already ordered, this medication has been reviewed and there are no changes at this time.  Avoidence of caffeine and alcohol  Moderate elevation of the head of the bed   4. Arthritis of left knee Continue NSAID medications as already ordered, these medications have been reviewed and there are no changes at this time.  Continued activity and therapy was stressed.    Hortencia Pilar, MD  01/26/2023 3:26 PM

## 2023-01-28 ENCOUNTER — Ambulatory Visit (INDEPENDENT_AMBULATORY_CARE_PROVIDER_SITE_OTHER): Payer: Medicare Other

## 2023-01-28 ENCOUNTER — Ambulatory Visit (INDEPENDENT_AMBULATORY_CARE_PROVIDER_SITE_OTHER): Payer: Medicare Other | Admitting: Vascular Surgery

## 2023-01-28 ENCOUNTER — Encounter (INDEPENDENT_AMBULATORY_CARE_PROVIDER_SITE_OTHER): Payer: Self-pay | Admitting: Vascular Surgery

## 2023-01-28 VITALS — BP 173/78 | HR 61 | Resp 16 | Wt 165.0 lb

## 2023-01-28 DIAGNOSIS — Z86711 Personal history of pulmonary embolism: Secondary | ICD-10-CM | POA: Diagnosis not present

## 2023-01-28 DIAGNOSIS — M7989 Other specified soft tissue disorders: Secondary | ICD-10-CM | POA: Diagnosis not present

## 2023-01-28 DIAGNOSIS — M1712 Unilateral primary osteoarthritis, left knee: Secondary | ICD-10-CM | POA: Diagnosis not present

## 2023-01-28 DIAGNOSIS — M79662 Pain in left lower leg: Secondary | ICD-10-CM

## 2023-01-28 DIAGNOSIS — K219 Gastro-esophageal reflux disease without esophagitis: Secondary | ICD-10-CM

## 2023-01-29 ENCOUNTER — Encounter (INDEPENDENT_AMBULATORY_CARE_PROVIDER_SITE_OTHER): Payer: Self-pay | Admitting: Vascular Surgery

## 2023-02-16 ENCOUNTER — Ambulatory Visit
Admission: RE | Admit: 2023-02-16 | Discharge: 2023-02-16 | Disposition: A | Discharge status: Home / Self Care | Payer: Medicare Other | Source: Ambulatory Visit | Attending: Family Medicine | Admitting: Family Medicine

## 2023-02-16 DIAGNOSIS — Z1231 Encounter for screening mammogram for malignant neoplasm of breast: Secondary | ICD-10-CM | POA: Insufficient documentation

## 2023-04-22 ENCOUNTER — Ambulatory Visit: Payer: Medicare Other | Attending: Neurology

## 2023-04-22 DIAGNOSIS — M6281 Muscle weakness (generalized): Secondary | ICD-10-CM

## 2023-04-22 DIAGNOSIS — R269 Unspecified abnormalities of gait and mobility: Secondary | ICD-10-CM | POA: Diagnosis present

## 2023-04-22 DIAGNOSIS — R278 Other lack of coordination: Secondary | ICD-10-CM | POA: Diagnosis present

## 2023-04-22 DIAGNOSIS — R262 Difficulty in walking, not elsewhere classified: Secondary | ICD-10-CM | POA: Diagnosis present

## 2023-04-22 DIAGNOSIS — R2681 Unsteadiness on feet: Secondary | ICD-10-CM

## 2023-04-22 DIAGNOSIS — R2689 Other abnormalities of gait and mobility: Secondary | ICD-10-CM

## 2023-04-22 NOTE — Therapy (Signed)
OUTPATIENT PHYSICAL THERAPY NEURO EVALUATION   Patient Name: Dorothy Mccoy MRN: [id number] DOB:23-Jan-1951, 72 y.o., female Today's Date: 04/23/2023   PCP: Dr. Manson Allan REFERRING PROVIDER: Dr. Cristopher Peru  END OF SESSION:  PT End of Session - 04/23/23 1048     Visit Number 1    Number of Visits 24    Date for PT Re-Evaluation 07/15/23    Progress Note Due on Visit 10    PT Start Time 1102    PT Stop Time 1150    PT Time Calculation (min) 48 min    Equipment Utilized During Treatment Gait belt    Activity Tolerance Patient tolerated treatment well    Behavior During Therapy WFL for tasks assessed/performed             Past Medical History:  Diagnosis Date   Breast cancer ([facility])    bilateral   Chronic constipation    GERD (gastroesophageal reflux disease)    Osteoarthritis    Parkinson disease    Personal history of chemotherapy    Personal history of radiation therapy    PONV (postoperative nausea and vomiting)    Psoriatic arthritis ([facility])    Pulmonary embolism ([facility]) 2000   following mastectomy   Past Surgical History:  Procedure Laterality Date   BREAST LUMPECTOMY Right 2005   CATARACT EXTRACTION Bilateral    COLONOSCOPY WITH PROPOFOL N/A 04/10/2022   Procedure: COLONOSCOPY WITH PROPOFOL;  Surgeon: Regis Bill, MD;  Location: [facility] ENDOSCOPY;  Service: Endoscopy;  Laterality: N/A;   INGUINAL HERNIA REPAIR Right 2018   MASTECTOMY Left 2000   MYOMECTOMY     TOTAL KNEE ARTHROPLASTY Left    Patient Active Problem List   Diagnosis Date Noted   Pain and swelling of left lower leg 01/25/2023   Arthralgia of multiple sites 01/26/2022   Asthenia 01/26/2022   Gastroesophageal reflux disease 01/26/2022   History of mastectomy 01/26/2022   History of myomectomy 01/26/2022   Malignant tumor of breast ([facility]) 01/26/2022   Postoperative retention of urine 01/26/2022   History of pulmonary embolus (PE) 01/26/2022   Constipation 01/08/2022   History of colonic  polyps 01/08/2022   History of total knee arthroplasty 06/19/2021   History of breast cancer in female 06/02/2021   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   REFERRING DIAG: Parkinson's disease  THERAPY DIAG:  Difficulty in walking, not elsewhere classified  Abnormality of gait and mobility  Muscle weakness (generalized)  Other abnormalities of gait and mobility  Other lack of coordination  Unsteadiness on feet  Rationale for Evaluation and Treatment: Rehabilitation  SUBJECTIVE:  SUBJECTIVE STATEMENT: Patient reports having Parkinsons since 2015. Reports she also has a bakers cyst and dealing with tight knee.    Pt accompanied by: self  PERTINENT HISTORY: Patient is a 72 year old female with referral for Parkinsons disease. She was diagnosed in 2015 and per [name], PA report from visit on 01/20/2023: She reports her balance has been poor as of late. This is why she has started using a walker on a regular basis. She feels she cannot turn around fast and needs to hold on to something when she does. Last fall was around Christmastime. She states since then she has been especially cautious with her gait. She has also noticed some new dyskinesias over the past six-eight months. She does not feel it is worse at a specific time of day  or with relation to medication timing. Her tremor is worst in her right foot, especially when she relaxes at night. She does feel like she is moving slowly overall. She notes all of her symptoms are worse with anxiety and feeling rushed. Because of her balance and dyskinesias, she feels like she is worse overall than at her last appointment.  She has not noticed when her medication wears off. She does not typically notice when her medication kicks in, either.  She notes she is recovering from knee replacement surgery, and this is making her left leg stiff. Otherwise, she does not feel any stiffness. She also has psoriatic arthritis.  She reports feelings of depression and anxiety. She notes she lost her mother in 2022, just before she moved here. She was very close with her. She notes not being able to get out and about has also contributed.  She last had PT for her knee replacement, never for PD.    PAIN:  Are you having pain?  Tightness Left knee- wearing compression stocking  PRECAUTIONS: Fall  WEIGHT BEARING RESTRICTIONS: No  FALLS: Has patient fallen in last 6 months?  Last fall was Dec 2023 - while trying to decorate her Christmas tree.   LIVING ENVIRONMENT: Lives with: lives alone Lives in: House/apartment Stairs:  2 level home- basement (does not currently use) steps from garage- 3 with rail on right Has following equipment at home: Single point cane, Walker - 2 wheeled, and [employer] - 4 wheeled  PLOF: Independent  PATIENT GOALS: Improve my balance and loosen up my left knee  OBJECTIVE:   DIAGNOSTIC FINDINGS: EXAM: LEFT KNEE - COMPLETE 4+ VIEW   COMPARISON:  None Available.   FINDINGS: Total knee prosthesis observed, with proximally 1.5 mm lucency along the lateral tibial tray component on the frontal projection (frontal zones 3 and 4). Equivocal/minimal lucency along the methacrylate-bone interface along the femoral component in zone 5. The appearance suggests mild  findings of loosening.   Small knee effusion noted.   IMPRESSION: 1. Mild lucency along the lateral tibial tray component (zones 3 and 4) and along the Methacrylate-bone interface along the femoral component (lateral zone 5), suggesting mild loosening. 2. Small knee effusion.     Electronically Signed   By: Gaylyn Rong M.D.   On: 12/31/2022 17:16    COGNITION: Overall cognitive status: Within functional limits for tasks assessed   SENSATION: WFL  COORDINATION: Slow initiation of movement  EDEMA:  None present    POSTURE: rounded shoulders and forward head  LOWER EXTREMITY ROM:     Active  Right Eval Left Eval  Hip flexion    Hip extension    Hip  abduction    Hip adduction    Hip internal rotation    Hip external rotation    Knee flexion 115 88  Knee extension    Ankle dorsiflexion    Ankle plantarflexion    Ankle inversion    Ankle eversion     (Blank rows = not tested)  LOWER EXTREMITY MMT:    MMT Right Eval Left Eval  Hip flexion 4 4  Hip extension 4 4  Hip abduction 4 4  Hip adduction 4 4  Hip internal rotation 4 4  Hip external rotation 4 4  Knee flexion 4 4  Knee extension 4 2-  Ankle dorsiflexion 4 4  Ankle plantarflexion    Ankle inversion    Ankle eversion    (Blank rows = not tested)  TRANSFERS: Assistive device utilized: Single point cane  Sit to stand: CGA Stand to sit: CGA Chair to chair: CGA Floor:  Not assessed   GAIT: Gait pattern: step through pattern, decreased arm swing- Right, decreased arm swing- Left, decreased step length- Right, and decreased step length- Left Distance walked: 730 Assistive device utilized: Single point cane Level of assistance: CGA Comments: decreased overall step length- no observed shuffling  FUNCTIONAL TESTS:  5 times sit to stand: 53.48 sec with UE support * Left knee pain Timed up and go (TUG): 25.52 with cane 6 minute walk test: 730 feet with cane 10 meter walk test: 0.62  m/s  PATIENT SURVEYS:  FOTO 49 with goal of 59  TODAY'S TREATMENT:                                                                                                                              DATE: 04/23/2023  PT evaluation today    PATIENT EDUCATION: Education details: Goals, Functional outcome measures, Purpose of PT Person educated: Patient Education method: Explanation Education comprehension: verbalized understanding  HOME EXERCISE PROGRAM: Access Code: [email or web address] URL: [email or web address] Date: 04/23/2023 Prepared by: Maureen Ralphs  Exercises - Seated Knee Flexion Stretch  - 2 x daily - 3-4 sets - 20-30 sec hold - Seated Hamstring Stretch  - 2 x daily - 3-4 sets - 20-30 hold  GOALS: Goals reviewed with patient? Yes  SHORT TERM GOALS: Target date: 06/03/2023  Pt will be independent with HEP in order to improve strength and balance in order to decrease fall risk and improve function at home and work.  Baseline: EVAL: No formal HEP in place Goal status: INITIAL   LONG TERM GOALS: Target date: 07/15/2023  Pt will improve FOTO to target score of 59 to display perceived improvements in ability to complete ADL's.  Baseline: EVAL: 49 Goal status: INITIAL  2.  Pt will increase by at least 0.23 m/s in order to demonstrate clinically significant improvement in community ambulation.   Baseline: EVAL= 16.23 sec with cane or 0.62 m/s Goal status: INITIAL  3.  Pt will decrease TUG to below 17 seconds/decrease in  order to demonstrate decreased fall risk. Baseline: EVAL: 25.52 sec Goal status: INITIAL  4.  Pt will decrease 5TSTS by at least 20 seconds in order to demonstrate clinically significant improvement in LE strength.  Baseline: EVAL: 53.48 sec with B UE support Goal status: INITIAL  5.  Pt will increase by at least 14m (147ft) in order to demonstrate clinically significant improvement in cardiopulmonary endurance and community ambulation   Baseline: EVAL: 730 feet Goal status: INITIAL    ASSESSMENT:  CLINICAL IMPRESSION: Patient is a 72  y.o. female who was seen today for physical therapy evaluation and treatment for Parkinsons. She also presents with left knee pain and limited ROM with weakness including difficulty with transfers. She also presents with decreased coordination and functional mobility requiring cane for mobility. Will need to further assess her balance and add goal next session. Patient will benefit from skilled PT services to improve her ROM/Strengthening, balance, functional mobility and to improve quality of life and decrease risk of falling.   OBJECTIVE IMPAIRMENTS: Abnormal gait, cardiopulmonary status limiting activity, decreased activity tolerance, decreased balance, decreased coordination, decreased endurance, decreased mobility, difficulty walking, decreased ROM, decreased strength, hypomobility, impaired flexibility, and pain.   ACTIVITY LIMITATIONS: carrying, lifting, bending, sitting, standing, squatting, sleeping, stairs, and transfers  PARTICIPATION LIMITATIONS: cleaning, laundry, shopping, community activity, and yard work  PERSONAL FACTORS: Age and 1-2 comorbidities: Arthritis, Breast cancer, TKA  are also affecting patient's functional outcome.   REHAB POTENTIAL: Good  CLINICAL DECISION MAKING: Stable/uncomplicated  EVALUATION COMPLEXITY: Low  PLAN:  PT FREQUENCY: 1-2x/week  PT DURATION: 12 weeks  PLANNED INTERVENTIONS: Therapeutic exercises, Therapeutic activity, Neuromuscular re-education, Balance training, Gait training, Patient/Family education, Self Care, Joint mobilization, Stair training, Vestibular training, Canalith repositioning, DME instructions, Dry Needling, Electrical stimulation, Spinal manipulation, Spinal mobilization, Cryotherapy, Moist heat, Manual therapy, and Re-evaluation  PLAN FOR NEXT SESSION: Perform balance assessment and add goal as appropriate. Review HEP  and progress as appropriate. Therex For ROM/Strength; balance and gait training.    Lenda Kelp, PT 04/23/2023, 10:50 AM

## 2023-04-27 ENCOUNTER — Ambulatory Visit: Payer: Medicare Other | Admitting: Physical Therapy

## 2023-04-29 ENCOUNTER — Ambulatory Visit: Payer: Medicare Other

## 2023-04-29 DIAGNOSIS — R278 Other lack of coordination: Secondary | ICD-10-CM

## 2023-04-29 DIAGNOSIS — R2689 Other abnormalities of gait and mobility: Secondary | ICD-10-CM

## 2023-04-29 DIAGNOSIS — R269 Unspecified abnormalities of gait and mobility: Secondary | ICD-10-CM

## 2023-04-29 DIAGNOSIS — M6281 Muscle weakness (generalized): Secondary | ICD-10-CM

## 2023-04-29 DIAGNOSIS — R262 Difficulty in walking, not elsewhere classified: Secondary | ICD-10-CM

## 2023-04-29 DIAGNOSIS — R2681 Unsteadiness on feet: Secondary | ICD-10-CM

## 2023-04-29 NOTE — Therapy (Signed)
OUTPATIENT PHYSICAL THERAPY NEURO TREATMENT   Patient Name: Dorothy Mccoy MRN: [id number] DOB:05-12-1951, 72 y.o., female Today's Date: 04/29/2023   PCP: Dr. Manson Allan REFERRING PROVIDER: Dr. Cristopher Peru  END OF SESSION:  PT End of Session - 04/29/23 1054     Visit Number 2    Number of Visits 24    Date for PT Re-Evaluation 07/15/23    Progress Note Due on Visit 10    PT Start Time 1103    PT Stop Time 1144    PT Time Calculation (min) 41 min    Equipment Utilized During Treatment Gait belt    Activity Tolerance Patient tolerated treatment well    Behavior During Therapy WFL for tasks assessed/performed             Past Medical History:  Diagnosis Date   Breast cancer (HCC)    bilateral   Chronic constipation    GERD (gastroesophageal reflux disease)    Osteoarthritis    Parkinson disease    Personal history of chemotherapy    Personal history of radiation therapy    PONV (postoperative nausea and vomiting)    Psoriatic arthritis (HCC)    Pulmonary embolism (HCC) 2000   following mastectomy   Past Surgical History:  Procedure Laterality Date   BREAST LUMPECTOMY Right 2005   CATARACT EXTRACTION Bilateral    COLONOSCOPY WITH PROPOFOL N/A 04/10/2022   Procedure: COLONOSCOPY WITH PROPOFOL;  Surgeon: Regis Bill, MD;  Location: [facility] END[facility];  Service: Endoscopy;  Laterality: N/A;   INGUINAL HERNIA REPAIR Right 2018   MASTECTOMY Left 2000   MYOMECTOMY     TOTAL KNEE ARTHROPLASTY Left    Patient Active Problem List   Diagnosis Date Noted   Pain and swelling of left lower leg 01/25/2023   Arthralgia of multiple sites 01/26/2022   Asthenia 01/26/2022   Gastroesophageal reflux disease 01/26/2022   History of mastectomy 01/26/2022   History of myomectomy 01/26/2022   Malignant tumor of breast (HCC) 01/26/2022   Postoperative retention of urine 01/26/2022   History of pulmonary embolus (PE) 01/26/2022   Constipation 01/08/2022   History of colonic  polyps 01/08/2022   History of total knee arthroplasty 06/19/2021   History of breast cancer in female 06/02/2021   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   REFERRING DIAG: Parkinson's disease  THERAPY DIAG:  Difficulty in walking, not elsewhere classified  Abnormality of gait and mobility  Muscle weakness (generalized)  Other abnormalities of gait and mobility  Other lack of coordination  Unsteadiness on feet  Rationale for Evaluation and Treatment: Rehabilitation  SUBJECTIVE:  SUBJECTIVE STATEMENT: Patient reports doing a little better today. States has been trying to perform her knee ROM HEP.    Pt accompanied by: self  PERTINENT HISTORY: Patient is a 72 year old female with referral for Parkinsons disease. She was diagnosed in 2015 and per [name], PA report from visit on 01/20/2023: She reports her balance has been poor as of late. This is why she has started using a walker on a regular basis. She feels she cannot turn around fast and needs to hold on to something when she does. Last fall was around Christmastime. She states since then she has been especially cautious with her gait. She has also noticed some new dyskinesias over the past six-eight months. She does not feel it is worse at a specific time of day or with  relation to medication timing. Her tremor is worst in her right foot, especially when she relaxes at night. She does feel like she is moving slowly overall. She notes all of her symptoms are worse with anxiety and feeling rushed. Because of her balance and dyskinesias, she feels like she is worse overall than at her last appointment.  She has not noticed when her medication wears off. She does not typically notice when her medication kicks in, either.  She notes she is recovering from knee replacement surgery, and this is making her left leg stiff. Otherwise, she does not feel any stiffness. She also has psoriatic arthritis.  She reports feelings of depression and anxiety. She notes she lost her mother in 2022, just before she moved here. She was very close with her. She notes not being able to get out and about has also contributed.  She last had PT for her knee replacement, never for PD.    PAIN:  Are you having pain?  Tightness Left knee- wearing compression stocking  PRECAUTIONS: Fall  WEIGHT BEARING RESTRICTIONS: No  FALLS: Has patient fallen in last 6 months?  Last fall was Dec 2023 - while trying to decorate her Christmas tree.   LIVING ENVIRONMENT: Lives with: lives alone Lives in: House/apartment Stairs:  2 level home- basement (does not currently use) steps from garage- 3 with rail on right Has following equipment at home: Single point cane, Walker - 2 wheeled, and [employer] - 4 wheeled  PLOF: Independent  PATIENT GOALS: Improve my balance and loosen up my left knee  OBJECTIVE:   DIAGNOSTIC FINDINGS: EXAM: LEFT KNEE - COMPLETE 4+ VIEW   COMPARISON:  None Available.   FINDINGS: Total knee prosthesis observed, with proximally 1.5 mm lucency along the lateral tibial tray component on the frontal projection (frontal zones 3 and 4). Equivocal/minimal lucency along the methacrylate-bone interface along the femoral component in zone 5. The appearance suggests mild findings  of loosening.   Small knee effusion noted.   IMPRESSION: 1. Mild lucency along the lateral tibial tray component (zones 3 and 4) and along the Methacrylate-bone interface along the femoral component (lateral zone 5), suggesting mild loosening. 2. Small knee effusion.     Electronically Signed   By: Gaylyn Rong M.D.   On: 12/31/2022 17:16    COGNITION: Overall cognitive status: Within functional limits for tasks assessed   SENSATION: WFL  COORDINATION: Slow initiation of movement  EDEMA:  None present    POSTURE: rounded shoulders and forward head  LOWER EXTREMITY ROM:     Active  Right Eval Left Eval  Hip flexion    Hip extension    Hip abduction  Hip adduction    Hip internal rotation    Hip external rotation    Knee flexion 115 88  Knee extension    Ankle dorsiflexion    Ankle plantarflexion    Ankle inversion    Ankle eversion     (Blank rows = not tested)  LOWER EXTREMITY MMT:    MMT Right Eval Left Eval  Hip flexion 4 4  Hip extension 4 4  Hip abduction 4 4  Hip adduction 4 4  Hip internal rotation 4 4  Hip external rotation 4 4  Knee flexion 4 4  Knee extension 4 2-  Ankle dorsiflexion 4 4  Ankle plantarflexion    Ankle inversion    Ankle eversion    (Blank rows = not tested)  TRANSFERS: Assistive device utilized: Single point cane  Sit to stand: CGA Stand to sit: CGA Chair to chair: CGA Floor:  Not assessed   GAIT: Gait pattern: step through pattern, decreased arm swing- Right, decreased arm swing- Left, decreased step length- Right, and decreased step length- Left Distance walked: 730 Assistive device utilized: Single point cane Level of assistance: CGA Comments: decreased overall step length- no observed shuffling  FUNCTIONAL TESTS:  5 times sit to stand: 53.48 sec with UE support * Left knee pain Timed up and go (TUG): 25.52 with cane 6 minute walk test: 730 feet with cane 10 meter walk test: 0.62  m/s  PATIENT SURVEYS:  FOTO 49 with goal of 59  TODAY'S TREATMENT:                                                                                                                              DATE: 04/29/2023  Assessed Balance - NMR:  BERG- See flowsheet  FGA- See flowsheet   PATIENT EDUCATION: Education details: Goals, Functional outcome measures, Purpose of PT Person educated: Patient Education method: Explanation Education comprehension: verbalized understanding  HOME EXERCISE PROGRAM: Access Code: [email or web address] URL: [email or web address] Date: 04/23/2023 Prepared by: Maureen Ralphs  Exercises - Seated Knee Flexion Stretch  - 2 x daily - 3-4 sets - 20-30 sec hold - Seated Hamstring Stretch  - 2 x daily - 3-4 sets - 20-30 hold  GOALS: Goals reviewed with patient? Yes  SHORT TERM GOALS: Target date: 06/03/2023  Pt will be independent with HEP in order to improve strength and balance in order to decrease fall risk and improve function at home and work.  Baseline: EVAL: No formal HEP in place Goal status: INITIAL   LONG TERM GOALS: Target date: 07/15/2023  Pt will improve FOTO to target score of 59 to display perceived improvements in ability to complete ADL's.  Baseline: EVAL: 49 Goal status: INITIAL  2.  Pt will increase by at least 0.23 m/s in order to demonstrate clinically significant improvement in community ambulation.   Baseline: EVAL= 16.23 sec with cane or 0.62 m/s Goal status: INITIAL  3.  Pt will decrease TUG to  below 17 seconds/decrease in order to demonstrate decreased fall risk. Baseline: EVAL: 25.52 sec Goal status: INITIAL  4.  Pt will decrease 5TSTS by at least 20 seconds in order to demonstrate clinically significant improvement in LE strength.  Baseline: EVAL: 53.48 sec with B UE support Goal status: INITIAL  5.  Pt will increase by at least 28m (138ft) in order to demonstrate clinically significant improvement in  cardiopulmonary endurance and community ambulation  Baseline: EVAL: 730 feet Goal status: INITIAL  6. Pt will demonstrate improved left knee AROM knee flex > 100 deg for improved functional mobility including transfers and walking.   Baseline: EVAL: 88 deg  Goal status: INITIAL  7. Pt will improve FGA by at least 3 points in order to demonstrate clinically significant improvement in balance and decreased risk for falls.  Baseline: 04/29/2023=17/30 Goal status: INITIAL   8. Pt will improve BERG by at least 3 points in order to demonstrate clinically significant improvement in balance.  Baseline: 04/29/2023= 35/56  Goal Status: INITIAL  ASSESSMENT:  CLINICAL IMPRESSION: Treatment continued today with balance assessment and patient attempted Nustep for bilateral Knee ROM. She responded favorably to use of Nustep with no ill effects - no increased pain or discomfort today. Based on 2 balance assessments today patient does present at an increased risk of falling and balance will be a focal point in subsequent visits.  Patient will benefit from skilled PT services to improve her ROM/Strengthening, balance, functional mobility and to improve quality of life and decrease risk of falling.   OBJECTIVE IMPAIRMENTS: Abnormal gait, cardiopulmonary status limiting activity, decreased activity tolerance, decreased balance, decreased coordination, decreased endurance, decreased mobility, difficulty walking, decreased ROM, decreased strength, hypomobility, impaired flexibility, and pain.   ACTIVITY LIMITATIONS: carrying, lifting, bending, sitting, standing, squatting, sleeping, stairs, and transfers  PARTICIPATION LIMITATIONS: cleaning, laundry, shopping, community activity, and yard work  PERSONAL FACTORS: Age and 1-2 comorbidities: Arthritis, Breast cancer, TKA  are also affecting patient's functional outcome.   REHAB POTENTIAL: Good  CLINICAL DECISION MAKING: Stable/uncomplicated  EVALUATION  COMPLEXITY: Low  PLAN:  PT FREQUENCY: 1-2x/week  PT DURATION: 12 weeks  PLANNED INTERVENTIONS: Therapeutic exercises, Therapeutic activity, Neuromuscular re-education, Balance training, Gait training, Patient/Family education, Self Care, Joint mobilization, Stair training, Vestibular training, Canalith repositioning, DME instructions, Dry Needling, Electrical stimulation, Spinal manipulation, Spinal mobilization, Cryotherapy, Moist heat, Manual therapy, and Re-evaluation  PLAN FOR NEXT SESSION:Therex For Bilateral Knee ROM/Strength; balance and gait training.  Work on transfers to empower more use of Left LE.    Lenda Kelp, PT 04/29/2023, 5:28 PM

## 2023-05-04 ENCOUNTER — Ambulatory Visit: Payer: Medicare Other | Attending: Neurology | Admitting: Physical Therapy

## 2023-05-04 ENCOUNTER — Encounter: Payer: Self-pay | Admitting: Physical Therapy

## 2023-05-04 DIAGNOSIS — R2689 Other abnormalities of gait and mobility: Secondary | ICD-10-CM | POA: Diagnosis present

## 2023-05-04 DIAGNOSIS — R269 Unspecified abnormalities of gait and mobility: Secondary | ICD-10-CM | POA: Diagnosis present

## 2023-05-04 DIAGNOSIS — R2681 Unsteadiness on feet: Secondary | ICD-10-CM | POA: Insufficient documentation

## 2023-05-04 DIAGNOSIS — M6281 Muscle weakness (generalized): Secondary | ICD-10-CM | POA: Diagnosis present

## 2023-05-04 DIAGNOSIS — R262 Difficulty in walking, not elsewhere classified: Secondary | ICD-10-CM | POA: Diagnosis present

## 2023-05-04 NOTE — Therapy (Signed)
OUTPATIENT PHYSICAL THERAPY NEURO TREATMENT   Patient Name: Dorothy Mccoy MRN: [id number] DOB:09-Oct-1951, 72 y.o., female Today's Date: 05/04/2023   PCP: Dr. Manson Allan REFERRING PROVIDER: Dr. Cristopher Peru  END OF SESSION:  PT End of Session - 05/04/23 1432     Visit Number 3    Number of Visits 24    Date for PT Re-Evaluation 07/15/23    Progress Note Due on Visit 10    PT Start Time 1430    PT Stop Time 1515    PT Time Calculation (min) 45 min    Equipment Utilized During Treatment Gait belt    Activity Tolerance Patient tolerated treatment well    Behavior During Therapy WFL for tasks assessed/performed             Past Medical History:  Diagnosis Date   Breast cancer (HCC)    bilateral   Chronic constipation    GERD (gastroesophageal reflux disease)    Osteoarthritis    Parkinson disease    Personal history of chemotherapy    Personal history of radiation therapy    PONV (postoperative nausea and vomiting)    Psoriatic arthritis (HCC)    Pulmonary embolism (HCC) 2000   following mastectomy   Past Surgical History:  Procedure Laterality Date   BREAST LUMPECTOMY Right 2005   CATARACT EXTRACTION Bilateral    COLONOSCOPY WITH PROPOFOL N/A 04/10/2022   Procedure: COLONOSCOPY WITH PROPOFOL;  Surgeon: Regis Bill, MD;  Location: [facility] ENDOSCOPY;  Service: Endoscopy;  Laterality: N/A;   INGUINAL HERNIA REPAIR Right 2018   MASTECTOMY Left 2000   MYOMECTOMY     TOTAL KNEE ARTHROPLASTY Left    Patient Active Problem List   Diagnosis Date Noted   Pain and swelling of left lower leg 01/25/2023   Arthralgia of multiple sites 01/26/2022   Asthenia 01/26/2022   Gastroesophageal reflux disease 01/26/2022   History of mastectomy 01/26/2022   History of myomectomy 01/26/2022   Malignant tumor of breast (HCC) 01/26/2022   Postoperative retention of urine 01/26/2022   History of pulmonary embolus (PE) 01/26/2022   Constipation 01/08/2022   History of colonic  polyps 01/08/2022   History of total knee arthroplasty 06/19/2021   History of breast cancer in female 06/02/2021   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   REFERRING DIAG: Parkinson's disease  THERAPY DIAG:  Difficulty in walking, not elsewhere classified  Abnormality of gait and mobility  Other abnormalities of gait and mobility  Unsteadiness on feet  Rationale for Evaluation and Treatment: Rehabilitation  SUBJECTIVE:  SUBJECTIVE STATEMENT: Patient reports doing a little better today. Has continues with HEP regularly, still having LLE stiffness.    Pt accompanied by: self  PERTINENT HISTORY: Patient is a 72 year old female with referral for Parkinsons disease. She was diagnosed in 2015 and per [name], PA report from visit on 01/20/2023: She reports her balance has been poor as of late. This is why she has started using a walker on a regular basis. She feels she cannot turn around fast and needs to hold on to something when she does. Last fall was around Christmastime. She states since then she has been especially cautious with her gait. She has also noticed some new dyskinesias over the past six-eight months. She does not feel it is worse at a specific time of day or with relation to medication timing. Her tremor is worst in her  right foot, especially when she relaxes at night. She does feel like she is moving slowly overall. She notes all of her symptoms are worse with anxiety and feeling rushed. Because of her balance and dyskinesias, she feels like she is worse overall than at her last appointment.  She has not noticed when her medication wears off. She does not typically notice when her medication kicks in, either.  She notes she is recovering from knee replacement surgery, and this is making her left leg stiff. Otherwise, she does not feel any stiffness. She also has psoriatic arthritis.  She reports feelings of depression and anxiety. She notes she lost her mother in 2022, just before she moved here. She was very close with her. She notes not being able to get out and about has also contributed.  She last had PT for her knee replacement, never for PD.    PAIN:  Are you having pain?  Tightness Left knee- wearing compression stocking  PRECAUTIONS: Fall  WEIGHT BEARING RESTRICTIONS: No  FALLS: Has patient fallen in last 6 months?  Last fall was Dec 2023 - while trying to decorate her Christmas tree.   LIVING ENVIRONMENT: Lives with: lives alone Lives in: House/apartment Stairs:  2 level home- basement (does not currently use) steps from garage- 3 with rail on right Has following equipment at home: Single point cane, Walker - 2 wheeled, and [employer] - 4 wheeled  PLOF: Independent  PATIENT GOALS: Improve my balance and loosen up my left knee  OBJECTIVE:   DIAGNOSTIC FINDINGS: EXAM: LEFT KNEE - COMPLETE 4+ VIEW   COMPARISON:  None Available.   FINDINGS: Total knee prosthesis observed, with proximally 1.5 mm lucency along the lateral tibial tray component on the frontal projection (frontal zones 3 and 4). Equivocal/minimal lucency along the methacrylate-bone interface along the femoral component in zone 5. The appearance suggests mild findings of loosening.   Small knee effusion noted.    IMPRESSION: 1. Mild lucency along the lateral tibial tray component (zones 3 and 4) and along the Methacrylate-bone interface along the femoral component (lateral zone 5), suggesting mild loosening. 2. Small knee effusion.     Electronically Signed   By: Gaylyn Rong M.D.   On: 12/31/2022 17:16    COGNITION: Overall cognitive status: Within functional limits for tasks assessed   SENSATION: WFL  COORDINATION: Slow initiation of movement  EDEMA:  None present    POSTURE: rounded shoulders and forward head  LOWER EXTREMITY ROM:     Active  Right Eval Left Eval  Hip flexion    Hip extension    Hip abduction  Hip adduction    Hip internal rotation    Hip external rotation    Knee flexion 115 88  Knee extension    Ankle dorsiflexion    Ankle plantarflexion    Ankle inversion    Ankle eversion     (Blank rows = not tested)  LOWER EXTREMITY MMT:    MMT Right Eval Left Eval  Hip flexion 4 4  Hip extension 4 4  Hip abduction 4 4  Hip adduction 4 4  Hip internal rotation 4 4  Hip external rotation 4 4  Knee flexion 4 4  Knee extension 4 2-  Ankle dorsiflexion 4 4  Ankle plantarflexion    Ankle inversion    Ankle eversion    (Blank rows = not tested)  TRANSFERS: Assistive device utilized: Single point cane  Sit to stand: CGA Stand to sit: CGA Chair to chair: CGA Floor:  Not assessed   GAIT: Gait pattern: step through pattern, decreased arm swing- Right, decreased arm swing- Left, decreased step length- Right, and decreased step length- Left Distance walked: 730 Assistive device utilized: Single point cane Level of assistance: CGA Comments: decreased overall step length- no observed shuffling  FUNCTIONAL TESTS:  5 times sit to stand: 53.48 sec with UE support * Left knee pain Timed up and go (TUG): 25.52 with cane 6 minute walk test: 730 feet with cane 10 meter walk test: 0.62 m/s  PATIENT SURVEYS:  FOTO 49 with goal of  59  TODAY'S TREATMENT:                                                                                                                               BP: 148/67 with HR 68   Exercise/Activity Sets/Reps/Time/ Resistance Assistance Charge type Comments  [employer]  X 3 min   TE C/o discomfort in left knee due to ROM restrictions, discontinued as a result.   Seated heel slides  X 10 x 5 sed   TE   Seated PWR! Up  X 10   NMR   Standing PWR! Up, rock and step  X 10 ea on ea side when applicable   NMR PWR! Up works on postural strengthening and antigravity extension, PRW! Rock working on [employer] shifting, PRW! Twist targeting trunk rotation and PRW! Step targeting transition movements.    STS with airex in seat  2 x 5 reps   TE Cues for forward weight shift and education regarding proper form with sit to stand in functional setting.  Treatment Provided this session   Rationale for Evaluation and Treatment Rehabilitation  Pt educated throughout session about proper posture and technique with exercises. Improved exercise technique, movement at target joints, use of target muscles after min to mod verbal, visual, tactile cues. Note: Portions of this document were prepared using Dragon voice recognition software and although reviewed may contain unintentional dictation errors in syntax, grammar, or spelling.    PATIENT EDUCATION:  Education details: Goals, Functional outcome measures, Purpose of PT Person educated: Patient Education method: Explanation Education comprehension: verbalized understanding  HOME EXERCISE PROGRAM: Access Code: [email or web address] URL: [email or web address] Date: 04/23/2023 Prepared by: Maureen Ralphs  Exercises - Seated Knee Flexion Stretch  - 2 x daily - 3-4 sets - 20-30 sec hold - Seated Hamstring Stretch  - 2 x daily - 3-4 sets - 20-30 hold  GOALS: Goals reviewed with patient? Yes  SHORT TERM GOALS: Target date: 06/03/2023  Pt will be  independent with HEP in order to improve strength and balance in order to decrease fall risk and improve function at home and work.  Baseline: EVAL: No formal HEP in place Goal status: INITIAL   LONG TERM GOALS: Target date: 07/15/2023  Pt will improve FOTO to target score of 59 to display perceived improvements in ability to complete ADL's.  Baseline: EVAL: 49 Goal status: INITIAL  2.  Pt will increase by at least 0.23 m/s in order to demonstrate clinically significant improvement in community ambulation.   Baseline: EVAL= 16.23 sec with cane or 0.62 m/s Goal status: INITIAL  3.  Pt will decrease TUG to below 17 seconds/decrease in order to demonstrate decreased fall risk. Baseline: EVAL: 25.52 sec Goal status: INITIAL  4.  Pt will decrease 5TSTS by at least 20 seconds in order to demonstrate clinically significant improvement in LE strength.  Baseline: EVAL: 53.48 sec with B UE support Goal status: INITIAL  5.  Pt will increase by at least 33m (191ft) in order to demonstrate clinically significant improvement in cardiopulmonary endurance and community ambulation  Baseline: EVAL: 730 feet Goal status: INITIAL  6. Pt will demonstrate improved left knee AROM knee flex > 100 deg for improved functional mobility including transfers and walking.   Baseline: EVAL: 88 deg  Goal status: INITIAL  7. Pt will improve FGA by at least 3 points in order to demonstrate clinically significant improvement in balance and decreased risk for falls.  Baseline: 04/29/2023=17/30 Goal status: INITIAL   8. Pt will improve BERG by at least 3 points in order to demonstrate clinically significant improvement in balance.  Baseline: 04/29/2023= 35/56  Goal Status: INITIAL  ASSESSMENT:  CLINICAL IMPRESSION: Patient presents with good motivation for completion of physical therapy activities.  Patient continues to have left knee stiffness which does limit some of her ability to perform exercises  and activities.  Patient continues with left knee range of motion and strengthening activities in addition to Parkinson's specific activities to improve her overall function.  Patient responded well to cues for forward weight shift with sit to stand and was surprised for her ability to complete this with proper cues with proper form.Pt will continue to benefit from skilled physical therapy intervention to address impairments, improve QOL, and attain therapy goals.    OBJECTIVE IMPAIRMENTS: Abnormal gait, cardiopulmonary status limiting activity, decreased activity tolerance, decreased balance, decreased coordination, decreased endurance, decreased mobility, difficulty walking, decreased ROM, decreased strength, hypomobility, impaired flexibility, and pain.   ACTIVITY LIMITATIONS: carrying, lifting, bending, sitting, standing, squatting, sleeping, stairs, and transfers  PARTICIPATION LIMITATIONS: cleaning, laundry, shopping, community activity, and yard work  PERSONAL FACTORS: Age and 1-2 comorbidities: Arthritis, Breast cancer, TKA  are also affecting patient's functional outcome.   REHAB POTENTIAL: Good  CLINICAL DECISION MAKING: Stable/uncomplicated  EVALUATION COMPLEXITY: Low  PLAN:  PT FREQUENCY: 1-2x/week  PT DURATION: 12 weeks  PLANNED INTERVENTIONS: Therapeutic exercises, Therapeutic activity, Neuromuscular re-education, Balance training, Gait training, Patient/Family education, Self Care,  Joint mobilization, Stair training, Vestibular training, Canalith repositioning, DME instructions, Dry Needling, Electrical stimulation, Spinal manipulation, Spinal mobilization, Cryotherapy, Moist heat, Manual therapy, and Re-evaluation  PLAN FOR NEXT SESSION:Therex For Bilateral Knee ROM/Strength; balance and gait training.  Work on transfers to empower more use of Left LE.    Norman Herrlich, PT 05/04/2023, 2:40 PM

## 2023-05-07 ENCOUNTER — Ambulatory Visit: Payer: Medicare Other | Admitting: Physical Therapy

## 2023-05-11 ENCOUNTER — Ambulatory Visit: Payer: Medicare Other | Admitting: Physical Therapy

## 2023-05-11 NOTE — Therapy (Deleted)
OUTPATIENT PHYSICAL THERAPY NEURO TREATMENT   Patient Name: Dorothy Mccoy MRN: [id number] DOB:1951/04/18, 72 y.o., female Today's Date: 05/11/2023   PCP: Dr. Manson Allan REFERRING PROVIDER: Dr. Cristopher Peru  END OF SESSION:    Past Medical History:  Diagnosis Date   Breast cancer [facility])    bilateral   Chronic constipation    GERD (gastroesophageal reflux disease)    Osteoarthritis    Parkinson disease    Personal history of chemotherapy    Personal history of radiation therapy    PONV (postoperative nausea and vomiting)    Psoriatic arthritis (HCC)    Pulmonary embolism (HCC) 2000   following mastectomy   Past Surgical History:  Procedure Laterality Date   BREAST LUMPECTOMY Right 2005   CATARACT EXTRACTION Bilateral    COLONOSCOPY WITH PROPOFOL N/A 04/10/2022   Procedure: COLONOSCOPY WITH PROPOFOL;  Surgeon: Regis Bill, MD;  Location: [facility] ENDOSCOPY;  Service: Endoscopy;  Laterality: N/A;   INGUINAL HERNIA REPAIR Right 2018   MASTECTOMY Left 2000   MYOMECTOMY     TOTAL KNEE ARTHROPLASTY Left    Patient Active Problem List   Diagnosis Date Noted   Pain and swelling of left lower leg 01/25/2023   Arthralgia of multiple sites 01/26/2022   Asthenia 01/26/2022   Gastroesophageal reflux disease 01/26/2022   History of mastectomy 01/26/2022   History of myomectomy 01/26/2022   Malignant tumor of breast (HCC) 01/26/2022   Postoperative retention of urine 01/26/2022   History of pulmonary embolus (PE) 01/26/2022   Constipation 01/08/2022   History of colonic polyps 01/08/2022   History of total knee arthroplasty 06/19/2021   History of breast cancer in female 06/02/2021   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   REFERRING DIAG: Parkinson's disease  THERAPY DIAG:  No diagnosis found.  Rationale for Evaluation and Treatment: Rehabilitation  SUBJECTIVE:                                                                                                                                                                                             SUBJECTIVE STATEMENT: Patient reports doing a little better today. Has continues with HEP regularly, still having LLE  stiffness.    Pt accompanied by: self  PERTINENT HISTORY: Patient is a 72 year old female with referral for Parkinsons disease. She was diagnosed in 2015 and per [name], PA report from visit on 01/20/2023: She reports her balance has been poor as of late. This is why she has started using a walker on a regular basis. She feels she cannot turn around fast and needs to hold on to something when she does. Last fall was around Christmastime. She states since then she has been especially cautious with her gait. She has also noticed some new dyskinesias over the past six-eight months. She does not feel it is worse at a specific time of day or with relation to medication timing. Her tremor is worst in her right foot, especially when she relaxes at night. She does feel like she is moving slowly overall. She notes all of her symptoms are worse with anxiety and feeling rushed. Because of her balance and dyskinesias, she feels like she is worse overall than at her last appointment.  She has not noticed when her medication wears off. She does not typically notice when her medication kicks in, either.  She notes she is recovering from knee replacement surgery, and this is making her left leg stiff. Otherwise, she does not feel any stiffness.  She also has psoriatic arthritis.  She reports feelings of depression and anxiety. She notes she lost her mother in 2022, just before she moved here. She was very close with her. She notes not being able to get out and about has also contributed.  She last had PT for her knee replacement, never for PD.    PAIN:  Are you having pain?  Tightness Left knee- wearing compression stocking  PRECAUTIONS: Fall  WEIGHT BEARING RESTRICTIONS: No  FALLS: Has patient fallen in last 6 months?  Last fall was Dec 2023 - while trying to decorate her Christmas tree.   LIVING ENVIRONMENT: Lives with: lives alone Lives in: House/apartment Stairs:  2 level home- basement (does not currently use) steps from garage- 3 with rail on right Has following equipment at home: Single point cane, Walker - 2 wheeled, and [employer] - 4 wheeled  PLOF: Independent  PATIENT GOALS: Improve my balance and loosen up my left knee  OBJECTIVE:   DIAGNOSTIC FINDINGS: EXAM: LEFT KNEE - COMPLETE 4+ VIEW   COMPARISON:  None Available.   FINDINGS: Total knee prosthesis observed, with proximally 1.5 mm lucency along the lateral tibial tray component on the frontal projection (frontal zones 3 and 4). Equivocal/minimal lucency along the methacrylate-bone interface along the femoral component in zone 5. The appearance suggests mild findings of loosening.   Small knee effusion noted.   IMPRESSION: 1. Mild lucency along the lateral tibial tray component (zones 3 and 4) and along the Methacrylate-bone interface along the femoral component (lateral zone 5), suggesting mild loosening. 2. Small knee effusion.     Electronically Signed   By: Gaylyn Rong M.D.   On: 12/31/2022 17:16    COGNITION: Overall cognitive status: Within functional limits for tasks assessed   SENSATION: WFL  COORDINATION: Slow initiation of movement  EDEMA:  None present    POSTURE: rounded shoulders and forward head  LOWER  EXTREMITY ROM:     Active  Right Eval Left Eval  Hip flexion    Hip extension    Hip abduction    Hip adduction    Hip internal rotation    Hip external rotation  Knee flexion 115 88  Knee extension    Ankle dorsiflexion    Ankle plantarflexion    Ankle inversion    Ankle eversion     (Blank rows = not tested)  LOWER EXTREMITY MMT:    MMT Right Eval Left Eval  Hip flexion 4 4  Hip extension 4 4  Hip abduction 4 4  Hip adduction 4 4  Hip internal rotation 4 4  Hip external rotation 4 4  Knee flexion 4 4  Knee extension 4 2-  Ankle dorsiflexion 4 4  Ankle plantarflexion    Ankle inversion    Ankle eversion    (Blank rows = not tested)  TRANSFERS: Assistive device utilized: Single point cane  Sit to stand: CGA Stand to sit: CGA Chair to chair: CGA Floor:  Not assessed   GAIT: Gait pattern: step through pattern, decreased arm swing- Right, decreased arm swing- Left, decreased step length- Right, and decreased step length- Left Distance walked: 730 Assistive device utilized: Single point cane Level of assistance: CGA Comments: decreased overall step length- no observed shuffling  FUNCTIONAL TESTS:  5 times sit to stand: 53.48 sec with UE support * Left knee pain Timed up and go (TUG): 25.52 with cane 6 minute walk test: 730 feet with cane 10 meter walk test: 0.62 m/s  PATIENT SURVEYS:  FOTO 49 with goal of 59  TODAY'S TREATMENT:                                                                                                                               BP: 148/67 with HR 68   Exercise/Activity Sets/Reps/Time/ Resistance Assistance Charge type Comments  Nustep  X 6 min   TE B UE and LE for reciprocal movement training   Supine heel slides  SAQ  X 10 x 5 sed   TE   Seated PWR! Up  X 10   NMR   Standing PWR! Up, rock and step  X 10 ea on ea side when applicable   NMR PWR! Up works on postural strengthening and antigravity extension, PRW! Rock working  on [employer] shifting, PRW! Twist targeting trunk rotation and PRW! Step targeting transition movements.    STS with airex in seat  2 x 5 reps   TE Cues for forward weight shift and education regarding proper form with sit to stand in functional setting.  Treatment Provided this session   Rationale for Evaluation and Treatment Rehabilitation  Pt educated throughout session about proper posture and technique with exercises. Improved exercise technique, movement at target joints, use of target muscles after min to mod verbal, visual, tactile cues. Note: Portions of this document were prepared using Dragon voice recognition software and although reviewed may contain unintentional dictation errors in syntax, grammar, or spelling.    PATIENT EDUCATION: Education details: Goals, Functional outcome measures, Purpose of PT Person educated: Patient Education method: Explanation Education comprehension: verbalized understanding  HOME  EXERCISE PROGRAM: Access Code: [email or web address] URL: [email or web address] Date: 04/23/2023 Prepared by: Maureen Ralphs  Exercises - Seated Knee Flexion Stretch  - 2 x daily - 3-4 sets - 20-30 sec hold - Seated Hamstring Stretch  - 2 x daily - 3-4 sets - 20-30 hold  GOALS: Goals reviewed with patient? Yes  SHORT TERM GOALS: Target date: 06/03/2023  Pt will be independent with HEP in order to improve strength and balance in order to decrease fall risk and improve function at home and work.  Baseline: EVAL: No formal HEP in place Goal status: INITIAL   LONG TERM GOALS: Target date: 07/15/2023  Pt will improve FOTO to target score of 59 to display perceived improvements in ability to complete ADL's.  Baseline: EVAL: 49 Goal status: INITIAL  2.  Pt will increase by at least 0.23 m/s in order to demonstrate clinically significant improvement in community ambulation.   Baseline: EVAL= 16.23 sec with cane or 0.62 m/s Goal status:  INITIAL  3.  Pt will decrease TUG to below 17 seconds/decrease in order to demonstrate decreased fall risk. Baseline: EVAL: 25.52 sec Goal status: INITIAL  4.  Pt will decrease 5TSTS by at least 20 seconds in order to demonstrate clinically significant improvement in LE strength.  Baseline: EVAL: 53.48 sec with B UE support Goal status: INITIAL  5.  Pt will increase by at least 47m (169ft) in order to demonstrate clinically significant improvement in cardiopulmonary endurance and community ambulation  Baseline: EVAL: 730 feet Goal status: INITIAL  6. Pt will demonstrate improved left knee AROM knee flex > 100 deg for improved functional mobility including transfers and walking.   Baseline: EVAL: 88 deg  Goal status: INITIAL  7. Pt will improve FGA by at least 3 points in order to demonstrate clinically significant improvement in balance and decreased risk for falls.  Baseline: 04/29/2023=17/30 Goal status: INITIAL   8. Pt will improve BERG by at least 3 points in order to demonstrate clinically significant improvement in balance.  Baseline: 04/29/2023= 35/56  Goal Status: INITIAL  ASSESSMENT:  CLINICAL IMPRESSION: Patient presents with good motivation for completion of physical therapy activities.  Patient continues to have left knee stiffness which does limit some of her ability to perform exercises and activities.  Patient continues with left knee range of motion and strengthening activities in addition to Parkinson's specific activities to improve her overall function.  Patient responded well to cues for forward weight shift with sit to stand and was surprised for her ability to complete this with proper cues with proper form.Pt will continue to benefit from skilled physical therapy intervention to address impairments, improve QOL, and attain therapy goals.    OBJECTIVE IMPAIRMENTS: Abnormal gait, cardiopulmonary status limiting activity, decreased activity tolerance, decreased  balance, decreased coordination, decreased endurance, decreased mobility, difficulty walking, decreased ROM, decreased strength, hypomobility, impaired flexibility, and pain.   ACTIVITY LIMITATIONS: carrying, lifting, bending, sitting, standing, squatting, sleeping, stairs, and transfers  PARTICIPATION LIMITATIONS: cleaning, laundry, shopping, community activity, and yard work  PERSONAL FACTORS: Age and 1-2 comorbidities: Arthritis, Breast cancer, TKA  are also affecting patient's functional outcome.   REHAB POTENTIAL: Good  CLINICAL DECISION MAKING: Stable/uncomplicated  EVALUATION COMPLEXITY: Low  PLAN:  PT FREQUENCY: 1-2x/week  PT DURATION: 12 weeks  PLANNED INTERVENTIONS: Therapeutic exercises, Therapeutic activity, Neuromuscular re-education, Balance training, Gait training, Patient/Family education, Self Care, Joint mobilization, Stair training, Vestibular training, Canalith repositioning, DME instructions, Dry Needling, Electrical stimulation, Spinal manipulation, Spinal mobilization, Cryotherapy, Moist heat,  Manual therapy, and Re-evaluation  PLAN FOR NEXT SESSION:Therex For Bilateral Knee ROM/Strength; balance and gait training.  Work on transfers to empower more use of Left LE.    Norman Herrlich, PT 05/11/2023, 10:08 AM

## 2023-05-13 ENCOUNTER — Ambulatory Visit: Payer: Medicare Other | Admitting: Physical Therapy

## 2023-05-18 ENCOUNTER — Ambulatory Visit: Payer: Medicare Other | Admitting: Physical Therapy

## 2023-05-18 DIAGNOSIS — R2681 Unsteadiness on feet: Secondary | ICD-10-CM

## 2023-05-18 DIAGNOSIS — R2689 Other abnormalities of gait and mobility: Secondary | ICD-10-CM

## 2023-05-18 DIAGNOSIS — R262 Difficulty in walking, not elsewhere classified: Secondary | ICD-10-CM

## 2023-05-18 DIAGNOSIS — M6281 Muscle weakness (generalized): Secondary | ICD-10-CM

## 2023-05-18 DIAGNOSIS — R269 Unspecified abnormalities of gait and mobility: Secondary | ICD-10-CM

## 2023-05-18 NOTE — Therapy (Signed)
OUTPATIENT PHYSICAL THERAPY NEURO TREATMENT   Patient Name: Dorothy Mccoy MRN: [id number] DOB:May 14, 1951, 72 y.o., female Today's Date: 05/18/2023   PCP: Dr. Manson Allan REFERRING PROVIDER: Dr. Cristopher Peru  END OF SESSION:  PT End of Session - 05/18/23 1454     Visit Number 4    Number of Visits 24    Date for PT Re-Evaluation 07/15/23    Progress Note Due on Visit 10    PT Start Time 0249    PT Stop Time 0329    PT Time Calculation (min) 40 min    Equipment Utilized During Treatment Gait belt    Activity Tolerance Patient tolerated treatment well    Behavior During Therapy WFL for tasks assessed/performed              Past Medical History:  Diagnosis Date   Breast cancer (HCC)    bilateral   Chronic constipation    GERD (gastroesophageal reflux disease)    Osteoarthritis    Parkinson disease    Personal history of chemotherapy    Personal history of radiation therapy    PONV (postoperative nausea and vomiting)    Psoriatic arthritis (HCC)    Pulmonary embolism (HCC) 2000   following mastectomy   Past Surgical History:  Procedure Laterality Date   BREAST LUMPECTOMY Right 2005   CATARACT EXTRACTION Bilateral    COLONOSCOPY WITH PROPOFOL N/A 04/10/2022   Procedure: COLONOSCOPY WITH PROPOFOL;  Surgeon: Regis Bill, MD;  Location: [facility] END[facility]COPY;  Service: Endoscopy;  Laterality: N/A;   INGUINAL HERNIA REPAIR Right 2018   MASTECTOMY Left 2000   MYOMECTOMY     TOTAL KNEE ARTHROPLASTY Left    Patient Active Problem List   Diagnosis Date Noted   Pain and swelling of left lower leg 01/25/2023   Arthralgia of multiple sites 01/26/2022   Asthenia 01/26/2022   Gastroesophageal reflux disease 01/26/2022   History of mastectomy 01/26/2022   History of myomectomy 01/26/2022   Malignant tumor of breast (HCC) 01/26/2022   Postoperative retention of urine 01/26/2022   History of pulmonary embolus (PE) 01/26/2022   Constipation 01/08/2022   History of  colonic polyps 01/08/2022   History of total knee arthroplasty 06/19/2021   History of breast cancer in female 06/02/2021   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   REFERRING DIAG: Parkinson's disease  THERAPY DIAG:  Difficulty in walking, not elsewhere classified  Abnormality of gait and mobility  Other abnormalities of gait and mobility  Unsteadiness on feet  Muscle weakness (generalized)  Rationale for Evaluation and Treatment: Rehabilitation  SUBJECTIVE:  SUBJECTIVE STATEMENT: Pt reports she is being tested for UTI today and has had trouble with tremors in her R LE since last session. Her MD wanted to increase her meds to counter this but she declined secondary to the side effects of proposed medication increase.    Pt accompanied by: self  PERTINENT HISTORY: Patient is a 72 year old female with referral for Parkinsons disease. She was diagnosed in 2015 and per [name], PA report from visit on 01/20/2023: She reports her balance has been poor as of late. This is why she has started using a walker on a regular basis. She feels she cannot turn around fast and needs to hold on to something when she does. Last fall was around Christmastime. She states since then she has been especially cautious with her gait. She has also noticed  some new dyskinesias over the past six-eight months. She does not feel it is worse at a specific time of day or with relation to medication timing. Her tremor is worst in her right foot, especially when she relaxes at night. She does feel like she is moving slowly overall. She notes all of her symptoms are worse with anxiety and feeling rushed. Because of her balance and dyskinesias, she feels like she is worse overall than at her last appointment.  She has not noticed when her medication wears off. She does not typically notice when her medication kicks in, either.  She notes she is recovering from knee replacement surgery, and this is making her left leg stiff. Otherwise, she does not feel any stiffness. She also has psoriatic arthritis.  She reports feelings of depression and anxiety. She notes she lost her mother in 2022, just before she moved here. She was very close with her. She notes not being able to get out and about has also contributed.  She last had PT for her knee replacement, never for PD.    PAIN:  Are you having pain?  Tightness Left knee- wearing compression stocking  PRECAUTIONS: Fall  WEIGHT BEARING RESTRICTIONS: No  FALLS: Has patient fallen in last 6 months?  Last fall was Dec 2023 - while trying to decorate her Christmas tree.   LIVING ENVIRONMENT: Lives with: lives alone Lives in: House/apartment Stairs:  2 level home- basement (does not currently use) steps from garage- 3 with rail on right Has following equipment at home: Single point cane, Walker - 2 wheeled, and [employer] - 4 wheeled  PLOF: Independent  PATIENT GOALS: Improve my balance and loosen up my left knee  OBJECTIVE:   DIAGNOSTIC FINDINGS: EXAM: LEFT KNEE - COMPLETE 4+ VIEW   COMPARISON:  None Available.   FINDINGS: Total knee prosthesis observed, with proximally 1.5 mm lucency along the lateral tibial tray component on the frontal projection (frontal zones 3 and 4). Equivocal/minimal lucency  along the methacrylate-bone interface along the femoral component in zone 5. The appearance suggests mild findings of loosening.   Small knee effusion noted.   IMPRESSION: 1. Mild lucency along the lateral tibial tray component (zones 3 and 4) and along the Methacrylate-bone interface along the femoral component (lateral zone 5), suggesting mild loosening. 2. Small knee effusion.     Electronically Signed   By: Gaylyn Rong M.D.   On: 12/31/2022 17:16    COGNITION: Overall cognitive status: Within functional limits for tasks assessed   SENSATION: WFL  COORDINATION: Slow initiation of movement  EDEMA:  None present    POSTURE: rounded shoulders and forward head  LOWER EXTREMITY ROM:     Active  Right Eval Left Eval  Hip flexion    Hip extension    Hip abduction    Hip adduction    Hip internal rotation    Hip external rotation    Knee flexion 115 88  Knee extension    Ankle dorsiflexion    Ankle plantarflexion    Ankle inversion    Ankle eversion     (Blank rows = not tested)  LOWER EXTREMITY MMT:    MMT Right Eval Left Eval  Hip flexion 4 4  Hip extension 4 4  Hip abduction 4 4  Hip adduction 4 4  Hip internal rotation 4 4  Hip external rotation 4 4  Knee flexion 4 4  Knee extension 4 2-  Ankle dorsiflexion 4 4  Ankle plantarflexion    Ankle inversion    Ankle eversion    (Blank rows = not tested)  TRANSFERS: Assistive device utilized: Single point cane  Sit to stand: CGA Stand to sit: CGA Chair to chair: CGA Floor:  Not assessed   GAIT: Gait pattern: step through pattern, decreased arm swing- Right, decreased arm swing- Left, decreased step length- Right, and decreased step length- Left Distance walked: 730 Assistive device utilized: Single point cane Level of assistance: CGA Comments: decreased overall step length- no observed shuffling  FUNCTIONAL TESTS:  5 times sit to stand: 53.48 sec with UE support * Left knee  pain Timed up and go (TUG): 25.52 with cane 6 minute walk test: 730 feet with cane 10 meter walk test: 0.62 m/s  PATIENT SURVEYS:  FOTO 49 with goal of 59  TODAY'S TREATMENT:                                                                                                                               BP: 148/67 with HR 68   Exercise/Activity Sets/Reps/Time/ Resistance Assistance Charge type Comments  Nustep  X 6 min   TE B UE and LE for reciprocal movement training   Elevated mat table in seated  -LAQ HS curl  HS curl end range     2x 10 with 3# 2 x 10 with RTB X 10   TE   Manual AP mobs with knee in 80 degrees of flexion on L only  X 8 min   Manual  To improve flexion end range of motion, noted increased ease of movement into end range flexion following   Seated PWR! twist  X 10   NMR Eye boosts   Ambulation with head turns / visual scanning  X 4 laps of 40 ft   NMR Little trunk rotation, addressed with PWR! Twist and cues .   STS from mat table at standard cahir height with PWR! Up in standing position each rep   2 x 5 reps   TE Cues for forward weight shift and education regarding proper form with sit to  stand in functional setting.  Treatment Provided this session   Rationale for Evaluation and Treatment Rehabilitation  Pt educated throughout session about proper posture and technique with exercises. Improved exercise technique, movement at target joints, use of target muscles after min to mod verbal, visual, tactile cues. Note: Portions of this document were prepared using Dragon voice recognition software and although reviewed may contain unintentional dictation errors in syntax, grammar, or spelling.    PATIENT EDUCATION: Education details: Goals, Functional outcome measures, Purpose of PT Person educated: Patient Education method: Explanation Education comprehension: verbalized understanding  HOME EXERCISE PROGRAM: Access Code: [email or web address] URL:  [email or web address] Date: 04/23/2023 Prepared by: Maureen Ralphs  Exercises - Seated Knee Flexion Stretch  - 2 x daily - 3-4 sets - 20-30 sec hold - Seated Hamstring Stretch  - 2 x daily - 3-4 sets - 20-30 hold  GOALS: Goals reviewed with patient? Yes  SHORT TERM GOALS: Target date: 06/03/2023  Pt will be independent with HEP in order to improve strength and balance in order to decrease fall risk and improve function at home and work.  Baseline: EVAL: No formal HEP in place Goal status: INITIAL   LONG TERM GOALS: Target date: 07/15/2023  Pt will improve FOTO to target score of 59 to display perceived improvements in ability to complete ADL's.  Baseline: EVAL: 49 Goal status: INITIAL  2.  Pt will increase by at least 0.23 m/s in order to demonstrate clinically significant improvement in community ambulation.   Baseline: EVAL= 16.23 sec with cane or 0.62 m/s Goal status: INITIAL  3.  Pt will decrease TUG to below 17 seconds/decrease in order to demonstrate decreased fall risk. Baseline: EVAL: 25.52 sec Goal status: INITIAL  4.  Pt will decrease 5TSTS by at least 20 seconds in order to demonstrate clinically significant improvement in LE strength.  Baseline: EVAL: 53.48 sec with B UE support Goal status: INITIAL  5.  Pt will increase by at least 45m (189ft) in order to demonstrate clinically significant improvement in cardiopulmonary endurance and community ambulation  Baseline: EVAL: 730 feet Goal status: INITIAL  6. Pt will demonstrate improved left knee AROM knee flex > 100 deg for improved functional mobility including transfers and walking.   Baseline: EVAL: 88 deg  Goal status: INITIAL  7. Pt will improve FGA by at least 3 points in order to demonstrate clinically significant improvement in balance and decreased risk for falls.  Baseline: 04/29/2023=17/30 Goal status: INITIAL   8. Pt will improve BERG by at least 3 points in order to  demonstrate clinically significant improvement in balance.  Baseline: 04/29/2023= 35/56  Goal Status: INITIAL  ASSESSMENT:  CLINICAL IMPRESSION:  Patient presents with good motivation for completion of physical therapy activities.  Patient continues with left knee range of motion and strengthening activities in addition to Parkinson's specific activities to improve her overall function. Pt responds well to manual therapy for improving knee range of motion and comfort. Pt shows good overall balance with ambulation but has some truncal rigidity noted and addressed with PWR! Activities. Pt will continue to benefit from skilled physical therapy intervention to address impairments, improve QOL, and attain therapy goals.    OBJECTIVE IMPAIRMENTS: Abnormal gait, cardiopulmonary status limiting activity, decreased activity tolerance, decreased balance, decreased coordination, decreased endurance, decreased mobility, difficulty walking, decreased ROM, decreased strength, hypomobility, impaired flexibility, and pain.   ACTIVITY LIMITATIONS: carrying, lifting, bending, sitting, standing, squatting, sleeping, stairs, and transfers  PARTICIPATION LIMITATIONS: cleaning, laundry, shopping, community activity,  and yard work  PERSONAL FACTORS: Age and 1-2 comorbidities: Arthritis, Breast cancer, TKA  are also affecting patient's functional outcome.   REHAB POTENTIAL: Good  CLINICAL DECISION MAKING: Stable/uncomplicated  EVALUATION COMPLEXITY: Low  PLAN:  PT FREQUENCY: 1-2x/week  PT DURATION: 12 weeks  PLANNED INTERVENTIONS: Therapeutic exercises, Therapeutic activity, Neuromuscular re-education, Balance training, Gait training, Patient/Family education, Self Care, Joint mobilization, Stair training, Vestibular training, Canalith repositioning, DME instructions, Dry Needling, Electrical stimulation, Spinal manipulation, Spinal mobilization, Cryotherapy, Moist heat, Manual therapy, and  Re-evaluation  PLAN FOR NEXT SESSION:Therex For Bilateral Knee ROM/Strength; balance and gait training.  Work on transfers to empower more use of Left LE.    Norman Herrlich, PT 05/18/2023, 2:56 PM

## 2023-05-20 ENCOUNTER — Ambulatory Visit: Payer: Medicare Other | Admitting: Physical Therapy

## 2023-05-20 ENCOUNTER — Encounter: Payer: Self-pay | Admitting: Physical Therapy

## 2023-05-20 DIAGNOSIS — R2689 Other abnormalities of gait and mobility: Secondary | ICD-10-CM

## 2023-05-20 DIAGNOSIS — R262 Difficulty in walking, not elsewhere classified: Secondary | ICD-10-CM | POA: Diagnosis not present

## 2023-05-20 DIAGNOSIS — R2681 Unsteadiness on feet: Secondary | ICD-10-CM

## 2023-05-20 DIAGNOSIS — R269 Unspecified abnormalities of gait and mobility: Secondary | ICD-10-CM

## 2023-05-20 NOTE — Therapy (Signed)
OUTPATIENT PHYSICAL THERAPY NEURO TREATMENT   Patient Name: Dorothy Mccoy MRN: [id number] DOB:Oct 23, 1951, 72 y.o., female Today's Date: 05/20/2023   PCP: Dr. Manson Allan REFERRING PROVIDER: Dr. Cristopher Peru  END OF SESSION:  PT End of Session - 05/20/23 1632     Visit Number 5    Number of Visits 24    Date for PT Re-Evaluation 07/15/23    Progress Note Due on Visit 10    PT Start Time 1445    PT Stop Time 1529    PT Time Calculation (min) 44 min    Equipment Utilized During Treatment Gait belt    Activity Tolerance Patient tolerated treatment well    Behavior During Therapy WFL for tasks assessed/performed               Past Medical History:  Diagnosis Date   Breast cancer (HCC)    bilateral   Chronic constipation    GERD (gastroesophageal reflux disease)    Osteoarthritis    Parkinson disease    Personal history of chemotherapy    Personal history of radiation therapy    PONV (postoperative nausea and vomiting)    Psoriatic arthritis (HCC)    Pulmonary embolism (HCC) 2000   following mastectomy   Past Surgical History:  Procedure Laterality Date   BREAST LUMPECTOMY Right 2005   CATARACT EXTRACTION Bilateral    COLONOSCOPY WITH PROPOFOL N/A 04/10/2022   Procedure: COLONOSCOPY WITH PROPOFOL;  Surgeon: Regis Bill, MD;  Location: [facility] ENDOSCOPY;  Service: Endoscopy;  Laterality: N/A;   INGUINAL HERNIA REPAIR Right 2018   MASTECTOMY Left 2000   MYOMECTOMY     TOTAL KNEE ARTHROPLASTY Left    Patient Active Problem List   Diagnosis Date Noted   Pain and swelling of left lower leg 01/25/2023   Arthralgia of multiple sites 01/26/2022   Asthenia 01/26/2022   Gastroesophageal reflux disease 01/26/2022   History of mastectomy 01/26/2022   History of myomectomy 01/26/2022   Malignant tumor of breast (HCC) 01/26/2022   Postoperative retention of urine 01/26/2022   History of pulmonary embolus (PE) 01/26/2022   Constipation 01/08/2022   History of  colonic polyps 01/08/2022   History of total knee arthroplasty 06/19/2021   History of breast cancer in female 06/02/2021   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   REFERRING DIAG: Parkinson's disease  THERAPY DIAG:  Difficulty in walking, not elsewhere classified  Abnormality of gait and mobility  Other abnormalities of gait and mobility  Unsteadiness on feet  Rationale for Evaluation and Treatment: Rehabilitation  SUBJECTIVE:  SUBJECTIVE STATEMENT: Pt reports she is doing well today, no changes since last session, no new info on UTI.   Pt accompanied by: self  PERTINENT HISTORY: Patient is a 72 year old female with referral for Parkinsons disease. She was diagnosed in 2015 and per [name], PA report from visit on 01/20/2023: She reports her balance has been poor as of late. This is why she has started using a walker on a regular basis. She feels she cannot turn around fast and needs to hold on to something when she does. Last fall was around Christmastime. She states since then she has been especially cautious with her gait. She has also noticed some new dyskinesias over the past six-eight months. She does not feel it is worse at a specific time of day or with relation to medication timing. Her tremor is worst in her right foot,  especially when she relaxes at night. She does feel like she is moving slowly overall. She notes all of her symptoms are worse with anxiety and feeling rushed. Because of her balance and dyskinesias, she feels like she is worse overall than at her last appointment.  She has not noticed when her medication wears off. She does not typically notice when her medication kicks in, either.  She notes she is recovering from knee replacement surgery, and this is making her left leg stiff. Otherwise, she does not feel any stiffness. She also has psoriatic arthritis.  She reports feelings of depression and anxiety. She notes she lost her mother in 2022, just before she moved here. She was very close with her. She notes not being able to get out and about has also contributed.  She last had PT for her knee replacement, never for PD.    PAIN:  Are you having pain?  Tightness Left knee- wearing compression stocking  PRECAUTIONS: Fall  WEIGHT BEARING RESTRICTIONS: No  FALLS: Has patient fallen in last 6 months?  Last fall was Dec 2023 - while trying to decorate her Christmas tree.   LIVING ENVIRONMENT: Lives with: lives alone Lives in: House/apartment Stairs:  2 level home- basement (does not currently use) steps from garage- 3 with rail on right Has following equipment at home: Single point cane, Walker - 2 wheeled, and [employer] - 4 wheeled  PLOF: Independent  PATIENT GOALS: Improve my balance and loosen up my left knee  OBJECTIVE:   DIAGNOSTIC FINDINGS: EXAM: LEFT KNEE - COMPLETE 4+ VIEW   COMPARISON:  None Available.   FINDINGS: Total knee prosthesis observed, with proximally 1.5 mm lucency along the lateral tibial tray component on the frontal projection (frontal zones 3 and 4). Equivocal/minimal lucency along the methacrylate-bone interface along the femoral component in zone 5. The appearance suggests mild findings of loosening.   Small knee effusion noted.   IMPRESSION: 1. Mild  lucency along the lateral tibial tray component (zones 3 and 4) and along the Methacrylate-bone interface along the femoral component (lateral zone 5), suggesting mild loosening. 2. Small knee effusion.     Electronically Signed   By: Gaylyn Rong M.D.   On: 12/31/2022 17:16    COGNITION: Overall cognitive status: Within functional limits for tasks assessed   SENSATION: WFL  COORDINATION: Slow initiation of movement  EDEMA:  None present    POSTURE: rounded shoulders and forward head  LOWER EXTREMITY ROM:     Active  Right Eval Left Eval  Hip flexion    Hip extension    Hip abduction  Hip adduction    Hip internal rotation    Hip external rotation    Knee flexion 115 88  Knee extension    Ankle dorsiflexion    Ankle plantarflexion    Ankle inversion    Ankle eversion     (Blank rows = not tested)  LOWER EXTREMITY MMT:    MMT Right Eval Left Eval  Hip flexion 4 4  Hip extension 4 4  Hip abduction 4 4  Hip adduction 4 4  Hip internal rotation 4 4  Hip external rotation 4 4  Knee flexion 4 4  Knee extension 4 2-  Ankle dorsiflexion 4 4  Ankle plantarflexion    Ankle inversion    Ankle eversion    (Blank rows = not tested)  TRANSFERS: Assistive device utilized: Single point cane  Sit to stand: CGA Stand to sit: CGA Chair to chair: CGA Floor:  Not assessed   GAIT: Gait pattern: step through pattern, decreased arm swing- Right, decreased arm swing- Left, decreased step length- Right, and decreased step length- Left Distance walked: 730 Assistive device utilized: Single point cane Level of assistance: CGA Comments: decreased overall step length- no observed shuffling  FUNCTIONAL TESTS:  5 times sit to stand: 53.48 sec with UE support * Left knee pain Timed up and go (TUG): 25.52 with cane 6 minute walk test: 730 feet with cane 10 meter walk test: 0.62 m/s  PATIENT SURVEYS:  FOTO 49 with goal of 59  TODAY'S TREATMENT:                                                                                                                                BP: 148/67 with HR 68   Exercise/Activity Sets/Reps/Time/ Resistance Assistance Charge type Comments  Nustep  X 6 min:  3 min level 1 1 min level 2   2 min level 1  TE B UE and LE for reciprocal movement training   Elevated mat table in seated  -LAQ HS curl    Alternating between the two x 10 each leg, no weight, working on ROM.  TE   Manual Distraction at L knee, combination of distraction and PROM. X 8 min   Manual  improve flexion end range of motion, noted increased ease of movement into end range flexion as well as increase AROM following  Seated PWR! Twist, and Up X 10   NMR Eye boosts for twist, hand boost for Up  Ambulation with head turns / visual scanning  2 x 40 feet in hallway, working on visual scanning  NMR Improved ability to scan after PWR! Moves preformed prior.  STS utilizing basketball   2 x 8, cues for anterior shift using quick and safe momentum   TE Cues for forward weight shift and education regarding proper form with sit to stand in functional setting. Good results, pt reports challenge of 6/10 for this exercise, but is doing so with  good form           Treatment Provided this session   Rationale for Evaluation and Treatment Rehabilitation  Pt educated throughout session about proper posture and technique with exercises. Improved exercise technique, movement at target joints, use of target muscles after min to mod verbal, visual, tactile cues. Note: Portions of this document were prepared using Dragon voice recognition software and although reviewed may contain unintentional dictation errors in syntax, grammar, or spelling.    PATIENT EDUCATION: Education details: Goals, Functional outcome measures, Purpose of PT Person educated: Patient Education method: Explanation Education comprehension: verbalized understanding  HOME EXERCISE  PROGRAM: Access Code: [email or web address] URL: [email or web address] Date: 04/23/2023 Prepared by: Maureen Ralphs  Exercises - Seated Knee Flexion Stretch  - 2 x daily - 3-4 sets - 20-30 sec hold - Seated Hamstring Stretch  - 2 x daily - 3-4 sets - 20-30 hold    -PWR! Seated exercises handout provided and reviewed each exercise GOALS: Goals reviewed with patient? Yes  SHORT TERM GOALS: Target date: 06/03/2023  Pt will be independent with HEP in order to improve strength and balance in order to decrease fall risk and improve function at home and work.  Baseline: EVAL: No formal HEP in place Goal status: INITIAL   LONG TERM GOALS: Target date: 07/15/2023  Pt will improve FOTO to target score of 59 to display perceived improvements in ability to complete ADL's.  Baseline: EVAL: 49 Goal status: INITIAL  2.  Pt will increase by at least 0.23 m/s in order to demonstrate clinically significant improvement in community ambulation.   Baseline: EVAL= 16.23 sec with cane or 0.62 m/s Goal status: INITIAL  3.  Pt will decrease TUG to below 17 seconds/decrease in order to demonstrate decreased fall risk. Baseline: EVAL: 25.52 sec Goal status: INITIAL  4.  Pt will decrease 5TSTS by at least 20 seconds in order to demonstrate clinically significant improvement in LE strength.  Baseline: EVAL: 53.48 sec with B UE support Goal status: INITIAL  5.  Pt will increase by at least 61m (171ft) in order to demonstrate clinically significant improvement in cardiopulmonary endurance and community ambulation  Baseline: EVAL: 730 feet Goal status: INITIAL  6. Pt will demonstrate improved left knee AROM knee flex > 100 deg for improved functional mobility including transfers and walking.   Baseline: EVAL: 88 deg  Goal status: INITIAL  7. Pt will improve FGA by at least 3 points in order to demonstrate clinically significant improvement in balance and decreased risk for  falls.  Baseline: 04/29/2023=17/30 Goal status: INITIAL   8. Pt will improve BERG by at least 3 points in order to demonstrate clinically significant improvement in balance.  Baseline: 04/29/2023= 35/56  Goal Status: INITIAL  ASSESSMENT:  CLINICAL IMPRESSION:  Patient appeared motivated and ready for treatment on this day. Session consisted or manual therapy to address L knee ROM deficits and prepare pt for rest of session, she responded well stating her knee felt better afterward. She was able to complete STS today from slightly elevated pt mat table while holding basketball with good results, pt reports surprising herself with how well she did. Additionally introduced to all seated PWR! Move sets and gave out physical copy for her to add into her HEP. Pt will continue to benefit from skilled physical therapy intervention to address impairments, improve QOL, and attain therapy goals.   OBJECTIVE IMPAIRMENTS: Abnormal gait, cardiopulmonary status limiting activity, decreased activity tolerance, decreased balance, decreased coordination, decreased  endurance, decreased mobility, difficulty walking, decreased ROM, decreased strength, hypomobility, impaired flexibility, and pain.   ACTIVITY LIMITATIONS: carrying, lifting, bending, sitting, standing, squatting, sleeping, stairs, and transfers  PARTICIPATION LIMITATIONS: cleaning, laundry, shopping, community activity, and yard work  PERSONAL FACTORS: Age and 1-2 comorbidities: Arthritis, Breast cancer, TKA  are also affecting patient's functional outcome.   REHAB POTENTIAL: Good  CLINICAL DECISION MAKING: Stable/uncomplicated  EVALUATION COMPLEXITY: Low  PLAN:  PT FREQUENCY: 1-2x/week  PT DURATION: 12 weeks  PLANNED INTERVENTIONS: Therapeutic exercises, Therapeutic activity, Neuromuscular re-education, Balance training, Gait training, Patient/Family education, Self Care, Joint mobilization, Stair training, Vestibular training, Canalith  repositioning, DME instructions, Dry Needling, Electrical stimulation, Spinal manipulation, Spinal mobilization, Cryotherapy, Moist heat, Manual therapy, and Re-evaluation  PLAN FOR NEXT SESSION:Therex For Bilateral Knee ROM/Strength; balance and gait training.  Work on transfers to empower more use of Left LE. Incorporation of large amplitude movements when appropriate.   Norman Herrlich, PT 05/20/2023, 5:14 PM

## 2023-05-25 ENCOUNTER — Encounter: Payer: Self-pay | Admitting: Physical Therapy

## 2023-05-25 ENCOUNTER — Ambulatory Visit: Payer: Medicare Other | Admitting: Physical Therapy

## 2023-05-25 DIAGNOSIS — R2681 Unsteadiness on feet: Secondary | ICD-10-CM

## 2023-05-25 DIAGNOSIS — R269 Unspecified abnormalities of gait and mobility: Secondary | ICD-10-CM

## 2023-05-25 DIAGNOSIS — R2689 Other abnormalities of gait and mobility: Secondary | ICD-10-CM

## 2023-05-25 DIAGNOSIS — R262 Difficulty in walking, not elsewhere classified: Secondary | ICD-10-CM | POA: Diagnosis not present

## 2023-05-25 NOTE — Therapy (Signed)
OUTPATIENT PHYSICAL THERAPY NEURO TREATMENT   Patient Name: Dorothy Mccoy MRN: [id number] DOB:1951-04-01, 72 y.o., female Today's Date: 05/25/2023   PCP: Dr. Manson Allan REFERRING PROVIDER: Dr. Cristopher Peru  END OF SESSION:  PT End of Session - 05/25/23 1450     Visit Number 6    Number of Visits 24    Date for PT Re-Evaluation 07/15/23    Progress Note Due on Visit 10    PT Start Time 1447    PT Stop Time 1529    PT Time Calculation (min) 42 min    Equipment Utilized During Treatment Gait belt    Activity Tolerance Patient tolerated treatment well    Behavior During Therapy WFL for tasks assessed/performed               Past Medical History:  Diagnosis Date   Breast cancer (HCC)    bilateral   Chronic constipation    GERD (gastroesophageal reflux disease)    Osteoarthritis    Parkinson disease    Personal history of chemotherapy    Personal history of radiation therapy    PONV (postoperative nausea and vomiting)    Psoriatic arthritis (HCC)    Pulmonary embolism (HCC) 2000   following mastectomy   Past Surgical History:  Procedure Laterality Date   BREAST LUMPECTOMY Right 2005   CATARACT EXTRACTION Bilateral    COLONOSCOPY WITH PROPOFOL N/A 04/10/2022   Procedure: COLONOSCOPY WITH PROPOFOL;  Surgeon: Regis Bill, MD;  Location: [facility] ENDOSCOPY;  Service: Endoscopy;  Laterality: N/A;   INGUINAL HERNIA REPAIR Right 2018   MASTECTOMY Left 2000   MYOMECTOMY     TOTAL KNEE ARTHROPLASTY Left    Patient Active Problem List   Diagnosis Date Noted   Pain and swelling of left lower leg 01/25/2023   Arthralgia of multiple sites 01/26/2022   Asthenia 01/26/2022   Gastroesophageal reflux disease 01/26/2022   History of mastectomy 01/26/2022   History of myomectomy 01/26/2022   Malignant tumor of breast (HCC) 01/26/2022   Postoperative retention of urine 01/26/2022   History of pulmonary embolus (PE) 01/26/2022   Constipation 01/08/2022   History of  colonic polyps 01/08/2022   History of total knee arthroplasty 06/19/2021   History of breast cancer in female 06/02/2021   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   REFERRING DIAG: Parkinson's disease  THERAPY DIAG:  Difficulty in walking, not elsewhere classified  Abnormality of gait and mobility  Other abnormalities of gait and mobility  Unsteadiness on feet  Rationale for Evaluation and Treatment: Rehabilitation  SUBJECTIVE:  SUBJECTIVE STATEMENT: Pt reports she is doing well today, she reports she did test positive for UTI has being treated for that actively.  No new symptoms have occurred.   Pt accompanied by: self  PERTINENT HISTORY: Patient is a 72 year old female with referral for Parkinsons disease. She was diagnosed in 2015 and per [name], PA report from visit on 01/20/2023: She reports her balance has been poor as of late. This is why she has started using a walker on a regular basis. She feels she cannot turn around fast and needs to hold on to something when she does. Last fall was around Christmastime. She states since then she has been especially cautious with her gait. She has also noticed some new dyskinesias over the past six-eight months. She does not feel it is worse at a specific time of day or with relation  to medication timing. Her tremor is worst in her right foot, especially when she relaxes at night. She does feel like she is moving slowly overall. She notes all of her symptoms are worse with anxiety and feeling rushed. Because of her balance and dyskinesias, she feels like she is worse overall than at her last appointment.  She has not noticed when her medication wears off. She does not typically notice when her medication kicks in, either.  She notes she is recovering from knee replacement surgery, and this is making her left leg stiff. Otherwise, she does not feel any stiffness. She also has psoriatic arthritis.  She reports feelings of depression and anxiety. She notes she lost her mother in 2022, just before she moved here. She was very close with her. She notes not being able to get out and about has also contributed.  She last had PT for her knee replacement, never for PD.    PAIN:  Are you having pain?  Tightness Left knee- wearing compression stocking  PRECAUTIONS: Fall  WEIGHT BEARING RESTRICTIONS: No  FALLS: Has patient fallen in last 6 months?  Last fall was Dec 2023 - while trying to decorate her Christmas tree.   LIVING ENVIRONMENT: Lives with: lives alone Lives in: House/apartment Stairs:  2 level home- basement (does not currently use) steps from garage- 3 with rail on right Has following equipment at home: Single point cane, Walker - 2 wheeled, and [employer] - 4 wheeled  PLOF: Independent  PATIENT GOALS: Improve my balance and loosen up my left knee  OBJECTIVE:   DIAGNOSTIC FINDINGS: EXAM: LEFT KNEE - COMPLETE 4+ VIEW   COMPARISON:  None Available.   FINDINGS: Total knee prosthesis observed, with proximally 1.5 mm lucency along the lateral tibial tray component on the frontal projection (frontal zones 3 and 4). Equivocal/minimal lucency along the methacrylate-bone interface along the femoral component in zone 5. The appearance suggests mild findings of  loosening.   Small knee effusion noted.   IMPRESSION: 1. Mild lucency along the lateral tibial tray component (zones 3 and 4) and along the Methacrylate-bone interface along the femoral component (lateral zone 5), suggesting mild loosening. 2. Small knee effusion.     Electronically Signed   By: Gaylyn Rong M.D.   On: 12/31/2022 17:16    COGNITION: Overall cognitive status: Within functional limits for tasks assessed   SENSATION: WFL  COORDINATION: Slow initiation of movement  EDEMA:  None present    POSTURE: rounded shoulders and forward head  LOWER EXTREMITY ROM:     Active  Right Eval Left Eval  Hip flexion  Hip extension    Hip abduction    Hip adduction    Hip internal rotation    Hip external rotation    Knee flexion 115 88  Knee extension    Ankle dorsiflexion    Ankle plantarflexion    Ankle inversion    Ankle eversion     (Blank rows = not tested)  LOWER EXTREMITY MMT:    MMT Right Eval Left Eval  Hip flexion 4 4  Hip extension 4 4  Hip abduction 4 4  Hip adduction 4 4  Hip internal rotation 4 4  Hip external rotation 4 4  Knee flexion 4 4  Knee extension 4 2-  Ankle dorsiflexion 4 4  Ankle plantarflexion    Ankle inversion    Ankle eversion    (Blank rows = not tested)  TRANSFERS: Assistive device utilized: Single point cane  Sit to stand: CGA Stand to sit: CGA Chair to chair: CGA Floor:  Not assessed   GAIT: Gait pattern: step through pattern, decreased arm swing- Right, decreased arm swing- Left, decreased step length- Right, and decreased step length- Left Distance walked: 730 Assistive device utilized: Single point cane Level of assistance: CGA Comments: decreased overall step length- no observed shuffling  FUNCTIONAL TESTS:  5 times sit to stand: 53.48 sec with UE support * Left knee pain Timed up and go (TUG): 25.52 with cane 6 minute walk test: 730 feet with cane 10 meter walk test: 0.62 m/s  PATIENT  SURVEYS:  FOTO 49 with goal of 59  TODAY'S TREATMENT:                                                                                                                               BP: 148/67 with HR 68   Exercise/Activity Sets/Reps/Time/ Resistance Assistance Charge type Comments  Nustep  X 2 min   TE B UE and LE for reciprocal movement training  Cramping noted in right hip flexor musculature during this activity.  Attempted some rest and stretching but did not alleviate and so NuStep was discontinued at this time.  Elevated mat table in seated  -LAQ HS curl    Alternating between the two x 10 each leg, no weight, working on ROM.  TE   Manual Distraction at L knee, combination of distraction and PROM. A/P glides x 2 min  STM to R quad secondary to cramping on this side.  X 15 min   Manual  improve flexion end range of motion, noted increased ease of movement into end range flexion as well as increase AROM following  Seated PWR! Rock, Twist, Step, and Up X 10   NMR Eye boosts for twist, hand boost for Up PWR! Up works on postural strengthening and antigravity extension, PRW! Rock working on [employer] shifting, PRW! Twist targeting trunk rotation and PRW! Step targeting transition movements. PWR! Moves target bradykinesia, rigidity, and dyskinesia through targeted functional movements that address  four core movement difficulties for people with Parkinson's disease.   PWR! Step standing  X 10 ea direction   NMR   STS utilizing basketball   3 x 6, cues for anterior shift using quick and safe momentum, last rep lowered mat table to increase muscular challenge    TE Cues for forward weight shift and education regarding proper form with sit to stand in functional setting. Good results, pt reports challenge of 6/10 for this exercise, but is doing so with good form    Treatment Provided this session   Rationale for Evaluation and Treatment Rehabilitation  Pt educated throughout session  about proper posture and technique with exercises. Improved exercise technique, movement at target joints, use of target muscles after min to mod verbal, visual, tactile cues. Note: Portions of this document were prepared using Dragon voice recognition software and although reviewed may contain unintentional dictation errors in syntax, grammar, or spelling.    PATIENT EDUCATION: Education details: Goals, Functional outcome measures, Purpose of PT Person educated: Patient Education method: Explanation Education comprehension: verbalized understanding  HOME EXERCISE PROGRAM: Access Code: [email or web address] URL: [email or web address] Date: 04/23/2023 Prepared by: Maureen Ralphs  Exercises - Seated Knee Flexion Stretch  - 2 x daily - 3-4 sets - 20-30 sec hold - Seated Hamstring Stretch  - 2 x daily - 3-4 sets - 20-30 hold    -PWR! Seated exercises handout provided and reviewed each exercise GOALS: Goals reviewed with patient? Yes  SHORT TERM GOALS: Target date: 06/03/2023  Pt will be independent with HEP in order to improve strength and balance in order to decrease fall risk and improve function at home and work.  Baseline: EVAL: No formal HEP in place Goal status: INITIAL   LONG TERM GOALS: Target date: 07/15/2023  Pt will improve FOTO to target score of 59 to display perceived improvements in ability to complete ADL's.  Baseline: EVAL: 49 Goal status: INITIAL  2.  Pt will increase by at least 0.23 m/s in order to demonstrate clinically significant improvement in community ambulation.   Baseline: EVAL= 16.23 sec with cane or 0.62 m/s Goal status: INITIAL  3.  Pt will decrease TUG to below 17 seconds/decrease in order to demonstrate decreased fall risk. Baseline: EVAL: 25.52 sec Goal status: INITIAL  4.  Pt will decrease 5TSTS by at least 20 seconds in order to demonstrate clinically significant improvement in LE strength.  Baseline: EVAL: 53.48 sec with B UE  support Goal status: INITIAL  5.  Pt will increase by at least 65m (11ft) in order to demonstrate clinically significant improvement in cardiopulmonary endurance and community ambulation  Baseline: EVAL: 730 feet Goal status: INITIAL  6. Pt will demonstrate improved left knee AROM knee flex > 100 deg for improved functional mobility including transfers and walking.   Baseline: EVAL: 88 deg  Goal status: INITIAL  7. Pt will improve FGA by at least 3 points in order to demonstrate clinically significant improvement in balance and decreased risk for falls.  Baseline: 04/29/2023=17/30 Goal status: INITIAL   8. Pt will improve BERG by at least 3 points in order to demonstrate clinically significant improvement in balance.  Baseline: 04/29/2023= 35/56  Goal Status: INITIAL  ASSESSMENT:  CLINICAL IMPRESSION:  Patient appeared motivated and ready for treatment on this day. Session consisted or manual therapy to address L knee ROM deficits and prepare pt for rest of session.  Patient making progress with sit to stand activity but is still somewhat limited due  to left knee range of motion restrictions from standing from lower surfaces.  Patient had some discomfort in her right hip flexor musculature which she reports is cramping sensation while completing the NuStep activity.  This was alleviated with some stretching and soft tissue mobilization but did recur later with further hip flexion muscle activation.  Patient instructed to ensure to hydrate properly to avoid these issues.  Pt will continue to benefit from skilled physical therapy intervention to address impairments, improve QOL, and attain therapy goals.   OBJECTIVE IMPAIRMENTS: Abnormal gait, cardiopulmonary status limiting activity, decreased activity tolerance, decreased balance, decreased coordination, decreased endurance, decreased mobility, difficulty walking, decreased ROM, decreased strength, hypomobility, impaired flexibility,  and pain.   ACTIVITY LIMITATIONS: carrying, lifting, bending, sitting, standing, squatting, sleeping, stairs, and transfers  PARTICIPATION LIMITATIONS: cleaning, laundry, shopping, community activity, and yard work  PERSONAL FACTORS: Age and 1-2 comorbidities: Arthritis, Breast cancer, TKA  are also affecting patient's functional outcome.   REHAB POTENTIAL: Good  CLINICAL DECISION MAKING: Stable/uncomplicated  EVALUATION COMPLEXITY: Low  PLAN:  PT FREQUENCY: 1-2x/week  PT DURATION: 12 weeks  PLANNED INTERVENTIONS: Therapeutic exercises, Therapeutic activity, Neuromuscular re-education, Balance training, Gait training, Patient/Family education, Self Care, Joint mobilization, Stair training, Vestibular training, Canalith repositioning, DME instructions, Dry Needling, Electrical stimulation, Spinal manipulation, Spinal mobilization, Cryotherapy, Moist heat, Manual therapy, and Re-evaluation  PLAN FOR NEXT SESSION:Therex For Bilateral Knee ROM/Strength; balance and gait training.  Work on transfers to empower more use of Left LE. Incorporation of large amplitude movements when appropriate.   Norman Herrlich, PT 05/25/2023, 2:51 PM

## 2023-05-27 ENCOUNTER — Ambulatory Visit: Payer: Medicare Other | Admitting: Physical Therapy

## 2023-05-28 ENCOUNTER — Ambulatory Visit: Payer: Medicare Other | Admitting: Physical Therapy

## 2023-05-28 NOTE — Therapy (Deleted)
OUTPATIENT PHYSICAL THERAPY NEURO TREATMENT   Patient Name: Dorothy Mccoy MRN: [id number] DOB:18-Sep-1951, 72 y.o., female Today's Date: 05/28/2023   PCP: Dr. Manson Allan REFERRING PROVIDER: Dr. Cristopher Peru  END OF SESSION:      Past Medical History:  Diagnosis Date   Breast cancer [facility])    bilateral   Chronic constipation    GERD (gastroesophageal reflux disease)    Osteoarthritis    Parkinson disease    Personal history of chemotherapy    Personal history of radiation therapy    PONV (postoperative nausea and vomiting)    Psoriatic arthritis (HCC)    Pulmonary embolism (HCC) 2000   following mastectomy   Past Surgical History:  Procedure Laterality Date   BREAST LUMPECTOMY Right 2005   CATARACT EXTRACTION Bilateral    COLONOSCOPY WITH PROPOFOL N/A 04/10/2022   Procedure: COLONOSCOPY WITH PROPOFOL;  Surgeon: Regis Bill, MD;  Location: [facility] ENDOSCOPY;  Service: Endoscopy;  Laterality: N/A;   INGUINAL HERNIA REPAIR Right 2018   MASTECTOMY Left 2000   MYOMECTOMY     TOTAL KNEE ARTHROPLASTY Left    Patient Active Problem List   Diagnosis Date Noted   Pain and swelling of left lower leg 01/25/2023   Arthralgia of multiple sites 01/26/2022   Asthenia 01/26/2022   Gastroesophageal reflux disease 01/26/2022   History of mastectomy 01/26/2022   History of myomectomy 01/26/2022   Malignant tumor of breast (HCC) 01/26/2022   Postoperative retention of urine 01/26/2022   History of pulmonary embolus (PE) 01/26/2022   Constipation 01/08/2022   History of colonic polyps 01/08/2022   History of total knee arthroplasty 06/19/2021   History of breast cancer in female 06/02/2021   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   REFERRING DIAG: Parkinson's disease  THERAPY DIAG:  No diagnosis found.  Rationale for Evaluation and Treatment: Rehabilitation  SUBJECTIVE:                                                                                                                                                                                             SUBJECTIVE STATEMENT: Pt reports she is doing well today, she reports she did test positive  for UTI has being treated for that actively.  No new symptoms have occurred.   Pt accompanied by: self  PERTINENT HISTORY: Patient is a 72 year old female with referral for Parkinsons disease. She was diagnosed in 2015 and per Dorothy Shears, PA report from visit on 01/20/2023: She reports her balance has been poor as of late. This is why she has started using a walker on a regular basis. She feels she cannot turn around fast and needs to hold on to something when she does. Last fall was around Christmastime. She states since then she has been especially cautious with her gait. She has also noticed some new dyskinesias over the past six-eight months. She does not feel it is worse at a specific time of day or with relation to medication timing. Her tremor is worst in her right foot, especially when she relaxes at night. She does feel like she is moving slowly overall. She notes all of her symptoms are worse with anxiety and feeling rushed. Because of her balance and dyskinesias, she feels like she is worse overall than at her last appointment.  She has not noticed when her medication wears off. She does not typically notice when her medication kicks in, either.  She notes she is recovering from knee replacement surgery, and this is making her left leg stiff.  Otherwise, she does not feel any stiffness. She also has psoriatic arthritis.  She reports feelings of depression and anxiety. She notes she lost her mother in 2022, just before she moved here. She was very close with her. She notes not being able to get out and about has also contributed.  She last had PT for her knee replacement, never for PD.    PAIN:  Are you having pain?  Tightness Left knee- wearing compression stocking  PRECAUTIONS: Fall  WEIGHT BEARING RESTRICTIONS: No  FALLS: Has patient fallen in last 6 months?  Last fall was Dec 2023 - while trying to decorate her Christmas tree.   LIVING ENVIRONMENT: Lives with: lives alone Lives in: House/apartment Stairs:  2 level home- basement (does not currently use) steps from garage- 3 with rail on right Has following equipment at home: Single point cane, Walker - 2 wheeled, and [employer] - 4 wheeled  PLOF: Independent  PATIENT GOALS: Improve my balance and loosen up my left knee  OBJECTIVE:   DIAGNOSTIC FINDINGS: EXAM: LEFT KNEE - COMPLETE 4+ VIEW   COMPARISON:  None Available.   FINDINGS: Total knee prosthesis observed, with proximally 1.5 mm lucency along the lateral tibial tray component on the frontal projection (frontal zones 3 and 4). Equivocal/minimal lucency along the methacrylate-bone interface along the femoral component in zone 5. The appearance suggests mild findings of loosening.   Small knee effusion noted.   IMPRESSION: 1. Mild lucency along the lateral tibial tray component (zones 3 and 4) and along the Methacrylate-bone interface along the femoral component (lateral zone 5), suggesting mild loosening. 2. Small knee effusion.     Electronically Signed   By: Gaylyn Rong M.D.   On: 12/31/2022 17:16    COGNITION: Overall cognitive status: Within functional limits for tasks assessed   SENSATION: WFL  COORDINATION: Slow initiation of movement  EDEMA:  None present    POSTURE:  rounded shoulders and forward head  LOWER EXTREMITY ROM:     Active  Right Eval Left Eval  Hip flexion    Hip extension    Hip abduction    Hip adduction  Hip internal rotation    Hip external rotation    Knee flexion 115 88  Knee extension    Ankle dorsiflexion    Ankle plantarflexion    Ankle inversion    Ankle eversion     (Blank rows = not tested)  LOWER EXTREMITY MMT:    MMT Right Eval Left Eval  Hip flexion 4 4  Hip extension 4 4  Hip abduction 4 4  Hip adduction 4 4  Hip internal rotation 4 4  Hip external rotation 4 4  Knee flexion 4 4  Knee extension 4 2-  Ankle dorsiflexion 4 4  Ankle plantarflexion    Ankle inversion    Ankle eversion    (Blank rows = not tested)  TRANSFERS: Assistive device utilized: Single point cane  Sit to stand: CGA Stand to sit: CGA Chair to chair: CGA Floor:  Not assessed   GAIT: Gait pattern: step through pattern, decreased arm swing- Right, decreased arm swing- Left, decreased step length- Right, and decreased step length- Left Distance walked: 730 Assistive device utilized: Single point cane Level of assistance: CGA Comments: decreased overall step length- no observed shuffling  FUNCTIONAL TESTS:  5 times sit to stand: 53.48 sec with UE support * Left knee pain Timed up and go (TUG): 25.52 with cane 6 minute walk test: 730 feet with cane 10 meter walk test: 0.62 m/s  PATIENT SURVEYS:  FOTO 49 with goal of 59  TODAY'S TREATMENT:                                                                                                                               BP: 148/67 with HR 68   Exercise/Activity Sets/Reps/Time/ Resistance Assistance Charge type Comments  Nustep  X 2 min   TE B UE and LE for reciprocal movement training  Cramping noted in right hip flexor musculature during this activity.  Attempted some rest and stretching but did not alleviate and so NuStep was discontinued at this time.  Elevated mat table  in seated  -LAQ HS curl    Alternating between the two x 10 each leg, no weight, working on ROM.  TE   Manual Distraction at L knee, combination of distraction and PROM. A/P glides x 2 min  STM to R quad secondary to cramping on this side.  X 15 min   Manual  improve flexion end range of motion, noted increased ease of movement into end range flexion as well as increase AROM following  Seated PWR! Rock, Twist, Step, and Up X 10   NMR Eye boosts for twist, hand boost for Up PWR! Up works on postural strengthening and antigravity extension, PRW! Rock working on [employer] shifting, PRW! Twist targeting trunk rotation and PRW! Step targeting transition movements. PWR! Moves target bradykinesia, rigidity, and dyskinesia through targeted functional movements that address four core movement difficulties for people with Parkinson's disease.   PWR! Step standing  X 10 ea direction   NMR   STS utilizing basketball   3 x 6, cues for anterior shift using quick and safe momentum, last rep lowered mat table to increase muscular challenge    TE Cues for forward weight shift and education regarding proper form with sit to stand in functional setting. Good results, pt reports challenge of 6/10 for this exercise, but is doing so with good form    Treatment Provided this session   Rationale for Evaluation and Treatment Rehabilitation  Pt educated throughout session about proper posture and technique with exercises. Improved exercise technique, movement at target joints, use of target muscles after min to mod verbal, visual, tactile cues. Note: Portions of this document were prepared using Dragon voice recognition software and although reviewed may contain unintentional dictation errors in syntax, grammar, or spelling.    PATIENT EDUCATION: Education details: Goals, Functional outcome measures, Purpose of PT Person educated: Patient Education method: Explanation Education comprehension: verbalized  understanding  HOME EXERCISE PROGRAM: Access Code: [email or web address] URL: [email or web address] Date: 04/23/2023 Prepared by: Maureen Ralphs  Exercises - Seated Knee Flexion Stretch  - 2 x daily - 3-4 sets - 20-30 sec hold - Seated Hamstring Stretch  - 2 x daily - 3-4 sets - 20-30 hold    -PWR! Seated exercises handout provided and reviewed each exercise GOALS: Goals reviewed with patient? Yes  SHORT TERM GOALS: Target date: 06/03/2023  Pt will be independent with HEP in order to improve strength and balance in order to decrease fall risk and improve function at home and work.  Baseline: EVAL: No formal HEP in place Goal status: INITIAL   LONG TERM GOALS: Target date: 07/15/2023  Pt will improve FOTO to target score of 59 to display perceived improvements in ability to complete ADL's.  Baseline: EVAL: 49 Goal status: INITIAL  2.  Pt will increase by at least 0.23 m/s in order to demonstrate clinically significant improvement in community ambulation.   Baseline: EVAL= 16.23 sec with cane or 0.62 m/s Goal status: INITIAL  3.  Pt will decrease TUG to below 17 seconds/decrease in order to demonstrate decreased fall risk. Baseline: EVAL: 25.52 sec Goal status: INITIAL  4.  Pt will decrease 5TSTS by at least 20 seconds in order to demonstrate clinically significant improvement in LE strength.  Baseline: EVAL: 53.48 sec with B UE support Goal status: INITIAL  5.  Pt will increase by at least 34m (149ft) in order to demonstrate clinically significant improvement in cardiopulmonary endurance and community ambulation  Baseline: EVAL: 730 feet Goal status: INITIAL  6. Pt will demonstrate improved left knee AROM knee flex > 100 deg for improved functional mobility including transfers and walking.   Baseline: EVAL: 88 deg  Goal status: INITIAL  7. Pt will improve FGA by at least 3 points in order to demonstrate clinically significant improvement in balance and  decreased risk for falls.  Baseline: 04/29/2023=17/30 Goal status: INITIAL   8. Pt will improve BERG by at least 3 points in order to demonstrate clinically significant improvement in balance.  Baseline: 04/29/2023= 35/56  Goal Status: INITIAL  ASSESSMENT:  CLINICAL IMPRESSION:  Patient appeared motivated and ready for treatment on this day. Session consisted or manual therapy to address L knee ROM deficits and prepare pt for rest of session.  Patient making progress with sit to stand activity but is still somewhat limited due to left knee range of motion restrictions from standing from lower surfaces.  Patient had  some discomfort in her right hip flexor musculature which she reports is cramping sensation while completing the NuStep activity.  This was alleviated with some stretching and soft tissue mobilization but did recur later with further hip flexion muscle activation.  Patient instructed to ensure to hydrate properly to avoid these issues.  Pt will continue to benefit from skilled physical therapy intervention to address impairments, improve QOL, and attain therapy goals.   OBJECTIVE IMPAIRMENTS: Abnormal gait, cardiopulmonary status limiting activity, decreased activity tolerance, decreased balance, decreased coordination, decreased endurance, decreased mobility, difficulty walking, decreased ROM, decreased strength, hypomobility, impaired flexibility, and pain.   ACTIVITY LIMITATIONS: carrying, lifting, bending, sitting, standing, squatting, sleeping, stairs, and transfers  PARTICIPATION LIMITATIONS: cleaning, laundry, shopping, community activity, and yard work  PERSONAL FACTORS: Age and 1-2 comorbidities: Arthritis, Breast cancer, TKA  are also affecting patient's functional outcome.   REHAB POTENTIAL: Good  CLINICAL DECISION MAKING: Stable/uncomplicated  EVALUATION COMPLEXITY: Low  PLAN:  PT FREQUENCY: 1-2x/week  PT DURATION: 12 weeks  PLANNED INTERVENTIONS: Therapeutic  exercises, Therapeutic activity, Neuromuscular re-education, Balance training, Gait training, Patient/Family education, Self Care, Joint mobilization, Stair training, Vestibular training, Canalith repositioning, DME instructions, Dry Needling, Electrical stimulation, Spinal manipulation, Spinal mobilization, Cryotherapy, Moist heat, Manual therapy, and Re-evaluation  PLAN FOR NEXT SESSION:Therex For Bilateral Knee ROM/Strength; balance and gait training.  Work on transfers to empower more use of Left LE. Incorporation of large amplitude movements when appropriate.   Norman Herrlich, PT 05/28/2023, 7:59 AM

## 2023-06-01 ENCOUNTER — Ambulatory Visit: Payer: Medicare Other | Admitting: Physical Therapy

## 2023-06-01 DIAGNOSIS — M6281 Muscle weakness (generalized): Secondary | ICD-10-CM

## 2023-06-01 DIAGNOSIS — R262 Difficulty in walking, not elsewhere classified: Secondary | ICD-10-CM

## 2023-06-01 DIAGNOSIS — R269 Unspecified abnormalities of gait and mobility: Secondary | ICD-10-CM

## 2023-06-01 DIAGNOSIS — R2681 Unsteadiness on feet: Secondary | ICD-10-CM

## 2023-06-01 DIAGNOSIS — R2689 Other abnormalities of gait and mobility: Secondary | ICD-10-CM

## 2023-06-01 NOTE — Therapy (Signed)
OUTPATIENT PHYSICAL THERAPY NEURO TREATMENT   Patient Name: Dorothy Mccoy MRN: [id number] DOB:Sep 01, 1951, 72 y.o., female Today's Date: 06/01/2023   PCP: Dr. Manson Allan REFERRING PROVIDER: Dr. Cristopher Peru  END OF SESSION:  PT End of Session - 06/01/23 1451     Visit Number 7    Number of Visits 24    Date for PT Re-Evaluation 07/15/23    Progress Note Due on Visit 10    PT Start Time 1448    PT Stop Time 1528    PT Time Calculation (min) 40 min    Equipment Utilized During Treatment Gait belt    Activity Tolerance Patient tolerated treatment well    Behavior During Therapy WFL for tasks assessed/performed                Past Medical History:  Diagnosis Date   Breast cancer (HCC)    bilateral   Chronic constipation    GERD (gastroesophageal reflux disease)    Osteoarthritis    Parkinson disease    Personal history of chemotherapy    Personal history of radiation therapy    PONV (postoperative nausea and vomiting)    Psoriatic arthritis (HCC)    Pulmonary embolism (HCC) 2000   following mastectomy   Past Surgical History:  Procedure Laterality Date   BREAST LUMPECTOMY Right 2005   CATARACT EXTRACTION Bilateral    COLONOSCOPY WITH PROPOFOL N/A 04/10/2022   Procedure: COLONOSCOPY WITH PROPOFOL;  Surgeon: Regis Bill, MD;  Location: [facility] ENDOSCOPY;  Service: Endoscopy;  Laterality: N/A;   INGUINAL HERNIA REPAIR Right 2018   MASTECTOMY Left 2000   MYOMECTOMY     TOTAL KNEE ARTHROPLASTY Left    Patient Active Problem List   Diagnosis Date Noted   Pain and swelling of left lower leg 01/25/2023   Arthralgia of multiple sites 01/26/2022   Asthenia 01/26/2022   Gastroesophageal reflux disease 01/26/2022   History of mastectomy 01/26/2022   History of myomectomy 01/26/2022   Malignant tumor of breast (HCC) 01/26/2022   Postoperative retention of urine 01/26/2022   History of pulmonary embolus (PE) 01/26/2022   Constipation 01/08/2022   History of  colonic polyps 01/08/2022   History of total knee arthroplasty 06/19/2021   History of breast cancer in female 06/02/2021   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   REFERRING DIAG: Parkinson's disease  THERAPY DIAG:  Difficulty in walking, not elsewhere classified  Abnormality of gait and mobility  Other abnormalities of gait and mobility  Unsteadiness on feet  Muscle weakness (generalized)  Rationale for Evaluation and Treatment: Rehabilitation  SUBJECTIVE:  SUBJECTIVE STATEMENT: Patient reports doing well today.  She reports her brother will be moving in with her Thursday and she spent the weekend getting ready for this.  She does report she felt her balance and mobility was improved and she was able to do a lot of stuff over the weekend she previously was uncomfortable doing.   Pt accompanied by: self  PERTINENT HISTORY: Patient is a 72 year old female with referral for Parkinsons disease. She was diagnosed in 2015 and per [name], PA report from visit on 01/20/2023: She reports her balance has been poor as of late. This is why she has started using a walker on a regular basis. She feels she cannot turn around fast and needs to hold on to something when she does. Last fall was around Christmastime. She states since then she  has been especially cautious with her gait. She has also noticed some new dyskinesias over the past six-eight months. She does not feel it is worse at a specific time of day or with relation to medication timing. Her tremor is worst in her right foot, especially when she relaxes at night. She does feel like she is moving slowly overall. She notes all of her symptoms are worse with anxiety and feeling rushed. Because of her balance and dyskinesias, she feels like she is worse overall than at her last appointment.  She has not noticed when her medication wears off. She does not typically notice when her medication kicks in, either.  She notes she is recovering from knee replacement surgery, and this is making her left leg stiff. Otherwise, she does not feel any stiffness. She also has psoriatic arthritis.  She reports feelings of depression and anxiety. She notes she lost her mother in 2022, just before she moved here. She was very close with her. She notes not being able to get out and about has also contributed.  She last had PT for her knee replacement, never for PD.    PAIN:  Are you having pain?  Tightness Left knee- wearing compression stocking  PRECAUTIONS: Fall  WEIGHT BEARING RESTRICTIONS: No  FALLS: Has patient fallen in last 6 months?  Last fall was Dec 2023 - while trying to decorate her Christmas tree.   LIVING ENVIRONMENT: Lives with: lives alone Lives in: House/apartment Stairs:  2 level home- basement (does not currently use) steps from garage- 3 with rail on right Has following equipment at home: Single point cane, Walker - 2 wheeled, and [employer] - 4 wheeled  PLOF: Independent  PATIENT GOALS: Improve my balance and loosen up my left knee  OBJECTIVE:   DIAGNOSTIC FINDINGS: EXAM: LEFT KNEE - COMPLETE 4+ VIEW   COMPARISON:  None Available.   FINDINGS: Total knee prosthesis observed, with proximally 1.5 mm lucency along the lateral tibial tray component on the  frontal projection (frontal zones 3 and 4). Equivocal/minimal lucency along the methacrylate-bone interface along the femoral component in zone 5. The appearance suggests mild findings of loosening.   Small knee effusion noted.   IMPRESSION: 1. Mild lucency along the lateral tibial tray component (zones 3 and 4) and along the Methacrylate-bone interface along the femoral component (lateral zone 5), suggesting mild loosening. 2. Small knee effusion.     Electronically Signed   By: Gaylyn Rong M.D.   On: 12/31/2022 17:16    COGNITION: Overall cognitive status: Within functional limits for tasks assessed   SENSATION: WFL  COORDINATION: Slow initiation of movement  EDEMA:  None present    POSTURE: rounded shoulders and forward head  LOWER EXTREMITY ROM:     Active  Right Eval Left Eval  Hip flexion    Hip extension    Hip abduction    Hip adduction    Hip internal rotation    Hip external rotation    Knee flexion 115 88  Knee extension    Ankle dorsiflexion    Ankle plantarflexion    Ankle inversion    Ankle eversion     (Blank rows = not tested)  LOWER EXTREMITY MMT:    MMT Right Eval Left Eval  Hip flexion 4 4  Hip extension 4 4  Hip abduction 4 4  Hip adduction 4 4  Hip internal rotation 4 4  Hip external rotation 4 4  Knee flexion 4 4  Knee extension 4 2-  Ankle dorsiflexion 4 4  Ankle plantarflexion    Ankle inversion    Ankle eversion    (Blank rows = not tested)  TRANSFERS: Assistive device utilized: Single point cane  Sit to stand: CGA Stand to sit: CGA Chair to chair: CGA Floor:  Not assessed   GAIT: Gait pattern: step through pattern, decreased arm swing- Right, decreased arm swing- Left, decreased step length- Right, and decreased step length- Left Distance walked: 730 Assistive device utilized: Single point cane Level of assistance: CGA Comments: decreased overall step length- no observed shuffling  FUNCTIONAL  TESTS:  5 times sit to stand: 53.48 sec with UE support * Left knee pain Timed up and go (TUG): 25.52 with cane 6 minute walk test: 730 feet with cane 10 meter walk test: 0.62 m/s  PATIENT SURVEYS:  FOTO 49 with goal of 59  TODAY'S TREATMENT:                                                                                                                                Exercise/Activity Sets/Reps/Time/ Resistance Assistance Charge type Comments  Nustep  X 6 min   TE B UE and LE for reciprocal movement training  Cramping noted in right hip flexor musculature during this activity.  Attempted some rest and stretching but did not alleviate and so NuStep was discontinued at this time.  Elevated mat table in seated  -LAQ HS curl    Alternating between the two x 10 each, no weight, working on ROM.  TE   Manual Distraction at L knee, combination of distraction and PROM. A/P glides x 2 min   X 10 min   Manual  improve flexion end range of motion, noted increased ease of movement into end range flexion as well as increase AROM following  Seated PWR! Rock, Twist, Step, and Up X 10   NMR Eye boosts for twist, hand boost for Up PWR! Up works on postural strengthening and antigravity extension, PRW! Rock working on [employer] shifting, PRW! Twist targeting trunk rotation and PRW! Step targeting transition  movements. PWR! Moves target bradykinesia, rigidity, and dyskinesia through targeted functional movements that address four core movement difficulties for people with Parkinson's disease.   PWR! Up, rock, twist, Step standing  X 10 ea direction   NMR Patient instructed not to complete these at home quite yet but continue working on seated PWR!  Moves  Balance obstacle course including stepping around cones stepping over hurdles and stepping over half bolsters X 4 rounds  NMR Cues for left lower extremity prevention of compensatory movement to make up for lack of knee flexion.  Treatment Provided  this session   Rationale for Evaluation and Treatment Rehabilitation  Pt educated throughout session about proper posture and technique with exercises. Improved exercise technique, movement at target joints, use of target muscles after min to mod verbal, visual, tactile cues. Note: Portions of this document were prepared using Dragon voice recognition software and although reviewed may contain unintentional dictation errors in syntax, grammar, or spelling.    PATIENT EDUCATION: Education details: Goals, Functional outcome measures, Purpose of PT Person educated: Patient Education method: Explanation Education comprehension: verbalized understanding  HOME EXERCISE PROGRAM: Access Code: [email or web address] URL: [email or web address] Date: 04/23/2023 Prepared by: Maureen Ralphs  Exercises - Seated Knee Flexion Stretch  - 2 x daily - 3-4 sets - 20-30 sec hold - Seated Hamstring Stretch  - 2 x daily - 3-4 sets - 20-30 hold    -PWR! Seated exercises handout provided and reviewed each exercise GOALS: Goals reviewed with patient? Yes  SHORT TERM GOALS: Target date: 06/03/2023  Pt will be independent with HEP in order to improve strength and balance in order to decrease fall risk and improve function at home and work.  Baseline: EVAL: No formal HEP in place Goal status: INITIAL   LONG TERM GOALS: Target date: 07/15/2023  Pt will improve FOTO to target score of 59 to display perceived improvements in ability to complete ADL's.  Baseline: EVAL: 49 Goal status: INITIAL  2.  Pt will increase by at least 0.23 m/s in order to demonstrate clinically significant improvement in community ambulation.   Baseline: EVAL= 16.23 sec with cane or 0.62 m/s Goal status: INITIAL  3.  Pt will decrease TUG to below 17 seconds/decrease in order to demonstrate decreased fall risk. Baseline: EVAL: 25.52 sec Goal status: INITIAL  4.  Pt will decrease 5TSTS by at least 20 seconds in order to  demonstrate clinically significant improvement in LE strength.  Baseline: EVAL: 53.48 sec with B UE support Goal status: INITIAL  5.  Pt will increase by at least 69m (170ft) in order to demonstrate clinically significant improvement in cardiopulmonary endurance and community ambulation  Baseline: EVAL: 730 feet Goal status: INITIAL  6. Pt will demonstrate improved left knee AROM knee flex > 100 deg for improved functional mobility including transfers and walking.   Baseline: EVAL: 88 deg  Goal status: INITIAL  7. Pt will improve FGA by at least 3 points in order to demonstrate clinically significant improvement in balance and decreased risk for falls.  Baseline: 04/29/2023=17/30 Goal status: INITIAL   8. Pt will improve BERG by at least 3 points in order to demonstrate clinically significant improvement in balance.  Baseline: 04/29/2023= 35/56  Goal Status: INITIAL  ASSESSMENT:  CLINICAL IMPRESSION:  Patient appeared motivated and ready for treatment on this day. Session consisted or manual therapy to address L knee ROM deficits and prepare pt for rest of session.  Patient making progress with sit to stand activity but  is still somewhat limited due to left knee range of motion restrictions from standing from lower surfaces.  Patient had some discomfort in her right hip flexor musculature which she reports is cramping sensation while completing the NuStep activity.  This was alleviated with some stretching and soft tissue mobilization but did recur later with further hip flexion muscle activation.  Patient instructed to ensure to hydrate properly to avoid these issues.  Pt will continue to benefit from skilled physical therapy intervention to address impairments, improve QOL, and attain therapy goals.   OBJECTIVE IMPAIRMENTS: Abnormal gait, cardiopulmonary status limiting activity, decreased activity tolerance, decreased balance, decreased coordination, decreased endurance, decreased  mobility, difficulty walking, decreased ROM, decreased strength, hypomobility, impaired flexibility, and pain.   ACTIVITY LIMITATIONS: carrying, lifting, bending, sitting, standing, squatting, sleeping, stairs, and transfers  PARTICIPATION LIMITATIONS: cleaning, laundry, shopping, community activity, and yard work  PERSONAL FACTORS: Age and 1-2 comorbidities: Arthritis, Breast cancer, TKA  are also affecting patient's functional outcome.   REHAB POTENTIAL: Good  CLINICAL DECISION MAKING: Stable/uncomplicated  EVALUATION COMPLEXITY: Low  PLAN:  PT FREQUENCY: 1-2x/week  PT DURATION: 12 weeks  PLANNED INTERVENTIONS: Therapeutic exercises, Therapeutic activity, Neuromuscular re-education, Balance training, Gait training, Patient/Family education, Self Care, Joint mobilization, Stair training, Vestibular training, Canalith repositioning, DME instructions, Dry Needling, Electrical stimulation, Spinal manipulation, Spinal mobilization, Cryotherapy, Moist heat, Manual therapy, and Re-evaluation  PLAN FOR NEXT SESSION:Therex For Bilateral Knee ROM/Strength; balance and gait training.  Work on transfers to empower more use of Left LE. Incorporation of large amplitude movements when appropriate.   Norman Herrlich, PT 06/01/2023, 2:51 PM

## 2023-06-03 ENCOUNTER — Ambulatory Visit: Payer: Medicare Other | Admitting: Physical Therapy

## 2023-06-08 ENCOUNTER — Ambulatory Visit: Payer: Medicare Other | Attending: Neurology | Admitting: Physical Therapy

## 2023-06-08 DIAGNOSIS — R2689 Other abnormalities of gait and mobility: Secondary | ICD-10-CM | POA: Diagnosis present

## 2023-06-08 DIAGNOSIS — R2681 Unsteadiness on feet: Secondary | ICD-10-CM | POA: Insufficient documentation

## 2023-06-08 DIAGNOSIS — M6281 Muscle weakness (generalized): Secondary | ICD-10-CM | POA: Insufficient documentation

## 2023-06-08 DIAGNOSIS — R269 Unspecified abnormalities of gait and mobility: Secondary | ICD-10-CM | POA: Diagnosis present

## 2023-06-08 DIAGNOSIS — R262 Difficulty in walking, not elsewhere classified: Secondary | ICD-10-CM | POA: Insufficient documentation

## 2023-06-08 NOTE — Therapy (Signed)
OUTPATIENT PHYSICAL THERAPY NEURO TREATMENT   Patient Name: Dorothy Mccoy MRN: [id number] DOB:[birth year], 72 y.o., female Today's Date: 06/08/2023   PCP: Dr. Manson Allan REFERRING PROVIDER: Dr. Cristopher Peru  END OF SESSION:  PT End of Session - 06/08/23 1450     Visit Number 8    Number of Visits 24    Date for PT Re-Evaluation 07/15/23    Progress Note Due on Visit 10    PT Start Time 1448    PT Stop Time 1529    PT Time Calculation (min) 41 min    Equipment Utilized During Treatment Gait belt    Activity Tolerance Patient tolerated treatment well    Behavior During Therapy WFL for tasks assessed/performed                Past Medical History:  Diagnosis Date   Breast cancer (HCC)    bilateral   Chronic constipation    GERD (gastroesophageal reflux disease)    Osteoarthritis    Parkinson disease    Personal history of chemotherapy    Personal history of radiation therapy    PONV (postoperative nausea and vomiting)    Psoriatic arthritis (HCC)    Pulmonary embolism (HCC) 2000   following mastectomy   Past Surgical History:  Procedure Laterality Date   BREAST LUMPECTOMY Right 2005   CATARACT EXTRACTION Bilateral    COLONOSCOPY WITH PROPOFOL N/A 04/10/2022   Procedure: COLONOSCOPY WITH PROPOFOL;  Surgeon: Regis Bill, MD;  Location: [facility] ENDOSCOPY;  Service: Endoscopy;  Laterality: N/A;   INGUINAL HERNIA REPAIR Right 2018   MASTECTOMY Left 2000   MYOMECTOMY     TOTAL KNEE ARTHROPLASTY Left    Patient Active Problem List   Diagnosis Date Noted   Pain and swelling of left lower leg 01/25/2023   Arthralgia of multiple sites 01/26/2022   Asthenia 01/26/2022   Gastroesophageal reflux disease 01/26/2022   History of mastectomy 01/26/2022   History of myomectomy 01/26/2022   Malignant tumor of breast (HCC) 01/26/2022   Postoperative retention of urine 01/26/2022   History of pulmonary embolus (PE) 01/26/2022   Constipation 01/08/2022   History of  colonic polyps 01/08/2022   History of total knee arthroplasty 06/19/2021   History of breast cancer in female 06/02/2021   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   REFERRING DIAG: Parkinson's disease  THERAPY DIAG:  Difficulty in walking, not elsewhere classified  Abnormality of gait and mobility  Other abnormalities of gait and mobility  Unsteadiness on feet  Muscle weakness (generalized)  Rationale for Evaluation and Treatment: Rehabilitation  SUBJECTIVE:  SUBJECTIVE STATEMENT:  Patient reports doing well today. Her brother moved in and hse has been adjusting to this.    Pt accompanied by: self  PERTINENT HISTORY: Patient is a 72 year old female with referral for Parkinsons disease. She was diagnosed in 2015 and per [name], PA report from visit on 01/20/2023: She reports her balance has been poor as of late. This is why she has started using a walker on a regular basis. She feels she cannot turn around fast and needs to hold on to something when she does. Last fall was around Christmastime. She states since then she has been especially cautious with her gait. She has also noticed some new dyskinesias over the past six-eight months. She does not feel it is worse at a specific time of day or with relation to medication timing.  Her tremor is worst in her right foot, especially when she relaxes at night. She does feel like she is moving slowly overall. She notes all of her symptoms are worse with anxiety and feeling rushed. Because of her balance and dyskinesias, she feels like she is worse overall than at her last appointment.  She has not noticed when her medication wears off. She does not typically notice when her medication kicks in, either.  She notes she is recovering from knee replacement surgery, and this is making her left leg stiff. Otherwise, she does not feel any stiffness. She also has psoriatic arthritis.  She reports feelings of depression and anxiety. She notes she lost her mother in 2022, just before she moved here. She was very close with her. She notes not being able to get out and about has also contributed.  She last had PT for her knee replacement, never for PD.    PAIN:  Are you having pain?  Tightness Left knee- wearing compression stocking  PRECAUTIONS: Fall  WEIGHT BEARING RESTRICTIONS: No  FALLS: Has patient fallen in last 6 months?  Last fall was Dec 2023 - while trying to decorate her Christmas tree.   LIVING ENVIRONMENT: Lives with: lives alone Lives in: House/apartment Stairs:  2 level home- basement (does not currently use) steps from garage- 3 with rail on right Has following equipment at home: Single point cane, Walker - 2 wheeled, and [employer] - 4 wheeled  PLOF: Independent  PATIENT GOALS: Improve my balance and loosen up my left knee  OBJECTIVE:   DIAGNOSTIC FINDINGS: EXAM: LEFT KNEE - COMPLETE 4+ VIEW   COMPARISON:  None Available.   FINDINGS: Total knee prosthesis observed, with proximally 1.5 mm lucency along the lateral tibial tray component on the frontal projection (frontal zones 3 and 4). Equivocal/minimal lucency along the methacrylate-bone interface along the femoral component in zone 5. The appearance suggests mild findings of loosening.   Small knee  effusion noted.   IMPRESSION: 1. Mild lucency along the lateral tibial tray component (zones 3 and 4) and along the Methacrylate-bone interface along the femoral component (lateral zone 5), suggesting mild loosening. 2. Small knee effusion.     Electronically Signed   By: Gaylyn Rong M.D.   On: 12/31/2022 17:16    COGNITION: Overall cognitive status: Within functional limits for tasks assessed   SENSATION: WFL  COORDINATION: Slow initiation of movement  EDEMA:  None present    POSTURE: rounded shoulders and forward head  LOWER EXTREMITY ROM:     Active  Right Eval Left Eval  Hip flexion    Hip extension    Hip abduction  Hip adduction    Hip internal rotation    Hip external rotation    Knee flexion 115 88  Knee extension    Ankle dorsiflexion    Ankle plantarflexion    Ankle inversion    Ankle eversion     (Blank rows = not tested)  LOWER EXTREMITY MMT:    MMT Right Eval Left Eval  Hip flexion 4 4  Hip extension 4 4  Hip abduction 4 4  Hip adduction 4 4  Hip internal rotation 4 4  Hip external rotation 4 4  Knee flexion 4 4  Knee extension 4 2-  Ankle dorsiflexion 4 4  Ankle plantarflexion    Ankle inversion    Ankle eversion    (Blank rows = not tested)  TRANSFERS: Assistive device utilized: Single point cane  Sit to stand: CGA Stand to sit: CGA Chair to chair: CGA Floor:  Not assessed   GAIT: Gait pattern: step through pattern, decreased arm swing- Right, decreased arm swing- Left, decreased step length- Right, and decreased step length- Left Distance walked: 730 Assistive device utilized: Single point cane Level of assistance: CGA Comments: decreased overall step length- no observed shuffling  FUNCTIONAL TESTS:  5 times sit to stand: 53.48 sec with UE support * Left knee pain Timed up and go (TUG): 25.52 with cane 6 minute walk test: 730 feet with cane 10 meter walk test: 0.62 m/s  PATIENT SURVEYS:  FOTO 49 with  goal of 59  TODAY'S TREATMENT:                                                                                                                                Exercise/Activity Sets/Reps/Time/ Resistance Assistance Charge type Comments  Nustep  X 6 min   TE B UE and LE for reciprocal movement training  Cramping noted in right hip flexor musculature during this activity.  Attempted some rest and stretching but did not alleviate and so NuStep was discontinued at this time.  Elevated mat table in seated  -LAQ HS curl    Alternating between the two x 10 each, no weight, working on ROM.  TE   Manual Distraction at L knee, combination of distraction and PROM. A/P glides x 2 min   X 10 min   Manual  improve flexion end range of motion, noted increased ease of movement into end range flexion as well as increase AROM following  Standing tandem balance  3 x 45 sec ea LE   NMR Eye boosts for twist, hand boost for Up PWR! Up works on postural strengthening and antigravity extension, PRW! Rock working on [employer] shifting, PRW! Twist targeting trunk rotation and PRW! Step targeting transition movements. PWR! Moves target bradykinesia, rigidity, and dyskinesia through targeted functional movements that address four core movement difficulties for people with Parkinson's disease.   // bar squats 2 x 10   TE Cues for hip hinge and for  equal WB bilaterally   Rear lunge  X 10 bilateral   TE Cues for step length   Ambulation x 325 ft with 2.5# AW  X 4 rounds  NMR Cues for left lower extremity prevention of compensatory movement to make up for lack of knee flexion.  Treatment Provided this session   Rationale for Evaluation and Treatment Rehabilitation  Pt educated throughout session about proper posture and technique with exercises. Improved exercise technique, movement at target joints, use of target muscles after min to mod verbal, visual, tactile cues. Note: Portions of this document were prepared  using Dragon voice recognition software and although reviewed may contain unintentional dictation errors in syntax, grammar, or spelling.    PATIENT EDUCATION: Education details: Goals, Functional outcome measures, Purpose of PT Person educated: Patient Education method: Explanation Education comprehension: verbalized understanding  HOME EXERCISE PROGRAM: Access Code: [email or web address] URL: [email or web address] Date: 04/23/2023 Prepared by: Maureen Ralphs  Exercises - Seated Knee Flexion Stretch  - 2 x daily - 3-4 sets - 20-30 sec hold - Seated Hamstring Stretch  - 2 x daily - 3-4 sets - 20-30 hold    -PWR! Seated exercises handout provided and reviewed each exercise GOALS: Goals reviewed with patient? Yes  SHORT TERM GOALS: Target date: 06/03/2023  Pt will be independent with HEP in order to improve strength and balance in order to decrease fall risk and improve function at home and work.  Baseline: EVAL: No formal HEP in place Goal status: INITIAL   LONG TERM GOALS: Target date: 07/15/2023  Pt will improve FOTO to target score of 59 to display perceived improvements in ability to complete ADL's.  Baseline: EVAL: 49 Goal status: INITIAL  2.  Pt will increase by at least 0.23 m/s in order to demonstrate clinically significant improvement in community ambulation.   Baseline: EVAL= 16.23 sec with cane or 0.62 m/s Goal status: INITIAL  3.  Pt will decrease TUG to below 17 seconds/decrease in order to demonstrate decreased fall risk. Baseline: EVAL: 25.52 sec Goal status: INITIAL  4.  Pt will decrease 5TSTS by at least 20 seconds in order to demonstrate clinically significant improvement in LE strength.  Baseline: EVAL: 53.48 sec with B UE support Goal status: INITIAL  5.  Pt will increase by at least 30m (14ft) in order to demonstrate clinically significant improvement in cardiopulmonary endurance and community ambulation  Baseline: EVAL: 730  feet Goal status: INITIAL  6. Pt will demonstrate improved left knee AROM knee flex > 100 deg for improved functional mobility including transfers and walking.   Baseline: EVAL: 88 deg  Goal status: INITIAL  7. Pt will improve FGA by at least 3 points in order to demonstrate clinically significant improvement in balance and decreased risk for falls.  Baseline: 04/29/2023=17/30 Goal status: INITIAL   8. Pt will improve BERG by at least 3 points in order to demonstrate clinically significant improvement in balance.  Baseline: 04/29/2023= 35/56  Goal Status: INITIAL  ASSESSMENT:  CLINICAL IMPRESSION:  Patient appeared motivated and ready for treatment on this day. Session consisted or manual therapy to address L knee ROM deficits and prepare pt for rest of session.  Pt challenged with progressive balance and ambulation interventions with good success.   Pt will continue to benefit from skilled physical therapy intervention to address impairments, improve QOL, and attain therapy goals.   OBJECTIVE IMPAIRMENTS: Abnormal gait, cardiopulmonary status limiting activity, decreased activity tolerance, decreased balance, decreased coordination, decreased endurance, decreased mobility, difficulty  walking, decreased ROM, decreased strength, hypomobility, impaired flexibility, and pain.   ACTIVITY LIMITATIONS: carrying, lifting, bending, sitting, standing, squatting, sleeping, stairs, and transfers  PARTICIPATION LIMITATIONS: cleaning, laundry, shopping, community activity, and yard work  PERSONAL FACTORS: Age and 1-2 comorbidities: Arthritis, Breast cancer, TKA  are also affecting patient's functional outcome.   REHAB POTENTIAL: Good  CLINICAL DECISION MAKING: Stable/uncomplicated  EVALUATION COMPLEXITY: Low  PLAN:  PT FREQUENCY: 1-2x/week  PT DURATION: 12 weeks  PLANNED INTERVENTIONS: Therapeutic exercises, Therapeutic activity, Neuromuscular re-education, Balance training, Gait training,  Patient/Family education, Self Care, Joint mobilization, Stair training, Vestibular training, Canalith repositioning, DME instructions, Dry Needling, Electrical stimulation, Spinal manipulation, Spinal mobilization, Cryotherapy, Moist heat, Manual therapy, and Re-evaluation  PLAN FOR NEXT SESSION:Therex For Bilateral Knee ROM/Strength; balance and gait training.  Work on transfers to empower more use of Left LE. Incorporation of large amplitude movements when appropriate.   Norman Herrlich, PT 06/08/2023, 2:51 PM

## 2023-06-10 ENCOUNTER — Ambulatory Visit: Payer: Medicare Other | Admitting: Physical Therapy

## 2023-06-15 ENCOUNTER — Ambulatory Visit: Payer: Medicare Other | Admitting: Physical Therapy

## 2023-06-17 ENCOUNTER — Ambulatory Visit: Payer: Medicare Other | Admitting: Physical Therapy

## 2023-06-17 DIAGNOSIS — R269 Unspecified abnormalities of gait and mobility: Secondary | ICD-10-CM

## 2023-06-17 DIAGNOSIS — R2689 Other abnormalities of gait and mobility: Secondary | ICD-10-CM

## 2023-06-17 DIAGNOSIS — R262 Difficulty in walking, not elsewhere classified: Secondary | ICD-10-CM

## 2023-06-17 DIAGNOSIS — R2681 Unsteadiness on feet: Secondary | ICD-10-CM

## 2023-06-17 NOTE — Therapy (Signed)
OUTPATIENT PHYSICAL THERAPY NEURO TREATMENT   Patient Name: Dorothy Mccoy MRN: [id number] DOB:Jan 28, 1951, 72 y.o., female Today's Date: 06/18/2023   PCP: Dr. Manson Allan REFERRING PROVIDER: Dr. Cristopher Peru  END OF SESSION:  PT End of Session - 06/17/23 1457     Visit Number 9    Number of Visits 24    Date for PT Re-Evaluation 07/15/23    Progress Note Due on Visit 10    PT Start Time 1450    PT Stop Time 1529    PT Time Calculation (min) 39 min    Equipment Utilized During Treatment Gait belt    Activity Tolerance Patient tolerated treatment well    Behavior During Therapy WFL for tasks assessed/performed                 Past Medical History:  Diagnosis Date   Breast cancer (HCC)    bilateral   Chronic constipation    GERD (gastroesophageal reflux disease)    Osteoarthritis    Parkinson disease    Personal history of chemotherapy    Personal history of radiation therapy    PONV (postoperative nausea and vomiting)    Psoriatic arthritis (HCC)    Pulmonary embolism (HCC) 2000   following mastectomy   Past Surgical History:  Procedure Laterality Date   BREAST LUMPECTOMY Right 2005   CATARACT EXTRACTION Bilateral    COLONOSCOPY WITH PROPOFOL N/A 04/10/2022   Procedure: COLONOSCOPY WITH PROPOFOL;  Surgeon: Regis Bill, MD;  Location: [facility] ENDOSCOPY;  Service: Endoscopy;  Laterality: N/A;   INGUINAL HERNIA REPAIR Right 2018   MASTECTOMY Left 2000   MYOMECTOMY     TOTAL KNEE ARTHROPLASTY Left    Patient Active Problem List   Diagnosis Date Noted   Pain and swelling of left lower leg 01/25/2023   Arthralgia of multiple sites 01/26/2022   Asthenia 01/26/2022   Gastroesophageal reflux disease 01/26/2022   History of mastectomy 01/26/2022   History of myomectomy 01/26/2022   Malignant tumor of breast (HCC) 01/26/2022   Postoperative retention of urine 01/26/2022   History of pulmonary embolus (PE) 01/26/2022   Constipation 01/08/2022   History of  colonic polyps 01/08/2022   History of total knee arthroplasty 06/19/2021   History of breast cancer in female 06/02/2021   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   REFERRING DIAG: Parkinson's disease  THERAPY DIAG:  Difficulty in walking, not elsewhere classified  Abnormality of gait and mobility  Other abnormalities of gait and mobility  Unsteadiness on feet  Rationale for Evaluation and Treatment: Rehabilitation  SUBJECTIVE:  SUBJECTIVE STATEMENT:  Patient reports doing well today. Her brother moved in and she has been adjusting to this. She did okay in the [place] but stayed in during that time to be safe.    Pt accompanied by: self  PERTINENT HISTORY: Patient is a 72 year old female with referral for Parkinsons disease. She was diagnosed in 2015 and per [name], PA report from visit on 01/20/2023: She reports her balance has been poor as of late. This is why she has started using a walker on a regular basis. She feels she cannot turn around fast and needs to hold on to something when she does. Last fall was around Christmastime. She states since then she has been especially cautious with her gait. She has also noticed some new dyskinesias over the past six-eight months. She does not feel it is worse at a specific time of  day or with relation to medication timing. Her tremor is worst in her right foot, especially when she relaxes at night. She does feel like she is moving slowly overall. She notes all of her symptoms are worse with anxiety and feeling rushed. Because of her balance and dyskinesias, she feels like she is worse overall than at her last appointment.  She has not noticed when her medication wears off. She does not typically notice when her medication kicks in, either.  She notes she is recovering from knee replacement surgery, and this is making her left leg stiff. Otherwise, she does not feel any stiffness. She also has psoriatic arthritis.  She reports feelings of depression and anxiety. She notes she lost her mother in 2022, just before she moved here. She was very close with her. She notes not being able to get out and about has also contributed.  She last had PT for her knee replacement, never for PD.    PAIN:  Are you having pain?  Tightness Left knee- wearing compression stocking  PRECAUTIONS: Fall  WEIGHT BEARING RESTRICTIONS: No  FALLS: Has patient fallen in last 6 months?  Last fall was Dec 2023 - while trying to decorate her Christmas tree.   LIVING ENVIRONMENT: Lives with: lives alone Lives in: House/apartment Stairs:  2 level home- basement (does not currently use) steps from garage- 3 with rail on right Has following equipment at home: Single point cane, Walker - 2 wheeled, and [employer] - 4 wheeled  PLOF: Independent  PATIENT GOALS: Improve my balance and loosen up my left knee  OBJECTIVE:   DIAGNOSTIC FINDINGS: EXAM: LEFT KNEE - COMPLETE 4+ VIEW   COMPARISON:  None Available.   FINDINGS: Total knee prosthesis observed, with proximally 1.5 mm lucency along the lateral tibial tray component on the frontal projection (frontal zones 3 and 4). Equivocal/minimal lucency along the methacrylate-bone interface along the femoral component in zone 5. The appearance suggests  mild findings of loosening.   Small knee effusion noted.   IMPRESSION: 1. Mild lucency along the lateral tibial tray component (zones 3 and 4) and along the Methacrylate-bone interface along the femoral component (lateral zone 5), suggesting mild loosening. 2. Small knee effusion.     Electronically Signed   By: Gaylyn Rong M.D.   On: 12/31/2022 17:16    COGNITION: Overall cognitive status: Within functional limits for tasks assessed   SENSATION: WFL  COORDINATION: Slow initiation of movement  EDEMA:  None present    POSTURE: rounded shoulders and forward head  LOWER EXTREMITY ROM:     Active  Right Eval  Left Eval  Hip flexion    Hip extension    Hip abduction    Hip adduction    Hip internal rotation    Hip external rotation    Knee flexion 115 88  Knee extension    Ankle dorsiflexion    Ankle plantarflexion    Ankle inversion    Ankle eversion     (Blank rows = not tested)  LOWER EXTREMITY MMT:    MMT Right Eval Left Eval  Hip flexion 4 4  Hip extension 4 4  Hip abduction 4 4  Hip adduction 4 4  Hip internal rotation 4 4  Hip external rotation 4 4  Knee flexion 4 4  Knee extension 4 2-  Ankle dorsiflexion 4 4  Ankle plantarflexion    Ankle inversion    Ankle eversion    (Blank rows = not tested)  TRANSFERS: Assistive device utilized: Single point cane  Sit to stand: CGA Stand to sit: CGA Chair to chair: CGA Floor:  Not assessed   GAIT: Gait pattern: step through pattern, decreased arm swing- Right, decreased arm swing- Left, decreased step length- Right, and decreased step length- Left Distance walked: 730 Assistive device utilized: Single point cane Level of assistance: CGA Comments: decreased overall step length- no observed shuffling  FUNCTIONAL TESTS:  5 times sit to stand: 53.48 sec with UE support * Left knee pain Timed up and go (TUG): 25.52 with cane 6 minute walk test: 730 feet with cane 10 meter walk test:  0.62 m/s  PATIENT SURVEYS:  FOTO 49 with goal of 59  TODAY'S TREATMENT:                                                                                                                                Exercise/Activity Sets/Reps/Time/ Resistance Assistance Charge type Comments  Elevated mat table in seated  -LAQ -HS curl    Alternating between the 2 x 10 each, 3# AW and RTB respectively  TE   Manual Distraction at L knee, combination of distraction and PROM. A/P glides x 2 min   X 10 min   Manual  improve flexion end range of motion, noted increased ease of movement into end range flexion as well as increase AROM following  Standing tandem balance  3 x 45 sec ea LE   NMR   Standing PWR! Up c in flow with PWR! Step standing  *10  NMR Some discomfort in knee on the R toward end of reps, did not do second set as a result.   Supine PWR! Up  Supine PRW! Step  2 x 10 reps   NMR   Ambulation x 257ft with 3# AW and intermittent retro walking X 1 rounds  NMR   Treatment Provided this session   Rationale for Evaluation and Treatment Rehabilitation  Pt educated throughout session about proper posture and technique with exercises. Improved exercise technique, movement at target joints,  use of target muscles after min to mod verbal, visual, tactile cues. Note: Portions of this document were prepared using Dragon voice recognition software and although reviewed may contain unintentional dictation errors in syntax, grammar, or spelling.    PATIENT EDUCATION: Education details: Goals, Functional outcome measures, Purpose of PT Person educated: Patient Education method: Explanation Education comprehension: verbalized understanding  HOME EXERCISE PROGRAM: Access Code: [email or web address] URL: [email or web address] Date: 04/23/2023 Prepared by: Maureen Ralphs  Exercises - Seated Knee Flexion Stretch  - 2 x daily - 3-4 sets - 20-30 sec hold - Seated Hamstring Stretch  - 2 x daily - 3-4  sets - 20-30 hold    -PWR! Seated exercises handout provided and reviewed each exercise GOALS: Goals reviewed with patient? Yes  SHORT TERM GOALS: Target date: 06/03/2023  Pt will be independent with HEP in order to improve strength and balance in order to decrease fall risk and improve function at home and work.  Baseline: EVAL: No formal HEP in place Goal status: INITIAL   LONG TERM GOALS: Target date: 07/15/2023  Pt will improve FOTO to target score of 59 to display perceived improvements in ability to complete ADL's.  Baseline: EVAL: 49 Goal status: INITIAL  2.  Pt will increase by at least 0.23 m/s in order to demonstrate clinically significant improvement in community ambulation.   Baseline: EVAL= 16.23 sec with cane or 0.62 m/s Goal status: INITIAL  3.  Pt will decrease TUG to below 17 seconds/decrease in order to demonstrate decreased fall risk. Baseline: EVAL: 25.52 sec Goal status: INITIAL  4.  Pt will decrease 5TSTS by at least 20 seconds in order to demonstrate clinically significant improvement in LE strength.  Baseline: EVAL: 53.48 sec with B UE support Goal status: INITIAL  5.  Pt will increase by at least 48m (133ft) in order to demonstrate clinically significant improvement in cardiopulmonary endurance and community ambulation  Baseline: EVAL: 730 feet Goal status: INITIAL  6. Pt will demonstrate improved left knee AROM knee flex > 100 deg for improved functional mobility including transfers and walking.   Baseline: EVAL: 88 deg  Goal status: INITIAL  7. Pt will improve FGA by at least 3 points in order to demonstrate clinically significant improvement in balance and decreased risk for falls.  Baseline: 04/29/2023=17/30 Goal status: INITIAL   8. Pt will improve BERG by at least 3 points in order to demonstrate clinically significant improvement in balance.  Baseline: 04/29/2023= 35/56  Goal Status: INITIAL  ASSESSMENT:  CLINICAL  IMPRESSION:  Pt presents with good motivation for completion of PT activities. Pt having R knee pain so some interventions limited as a result.  Began with some supine symptoms wellness recovery moves in order to limit strain on right knee that was having pain this date.  Pt will continue to benefit from skilled physical therapy intervention to address impairments, improve QOL, and attain therapy goals.   OBJECTIVE IMPAIRMENTS: Abnormal gait, cardiopulmonary status limiting activity, decreased activity tolerance, decreased balance, decreased coordination, decreased endurance, decreased mobility, difficulty walking, decreased ROM, decreased strength, hypomobility, impaired flexibility, and pain.   ACTIVITY LIMITATIONS: carrying, lifting, bending, sitting, standing, squatting, sleeping, stairs, and transfers  PARTICIPATION LIMITATIONS: cleaning, laundry, shopping, community activity, and yard work  PERSONAL FACTORS: Age and 1-2 comorbidities: Arthritis, Breast cancer, TKA  are also affecting patient's functional outcome.   REHAB POTENTIAL: Good  CLINICAL DECISION MAKING: Stable/uncomplicated  EVALUATION COMPLEXITY: Low  PLAN:  PT FREQUENCY: 1-2x/week  PT DURATION: 12 weeks  PLANNED INTERVENTIONS: Therapeutic exercises, Therapeutic activity, Neuromuscular re-education, Balance training, Gait training, Patient/Family education, Self Care, Joint mobilization, Stair training, Vestibular training, Canalith repositioning, DME instructions, Dry Needling, Electrical stimulation, Spinal manipulation, Spinal mobilization, Cryotherapy, Moist heat, Manual therapy, and Re-evaluation  PLAN FOR NEXT SESSION:Therex For Bilateral Knee ROM/Strength; balance and gait training.  Work on transfers to empower more use of Left LE. Incorporation of large amplitude movements when appropriate.   Norman Herrlich, PT 06/18/2023, 7:49 AM

## 2023-06-22 ENCOUNTER — Ambulatory Visit: Payer: Medicare Other | Admitting: Physical Therapy

## 2023-06-22 DIAGNOSIS — R269 Unspecified abnormalities of gait and mobility: Secondary | ICD-10-CM

## 2023-06-22 DIAGNOSIS — R2681 Unsteadiness on feet: Secondary | ICD-10-CM

## 2023-06-22 DIAGNOSIS — R262 Difficulty in walking, not elsewhere classified: Secondary | ICD-10-CM

## 2023-06-22 DIAGNOSIS — R2689 Other abnormalities of gait and mobility: Secondary | ICD-10-CM

## 2023-06-22 NOTE — Therapy (Signed)
OUTPATIENT PHYSICAL THERAPY NEURO TREATMENT/ Physical Therapy Progress Note   Dates of reporting period  04/22/23   to   06/22/23    Patient Name: Dorothy Mccoy MRN: [id number] DOB:1950-12-18, 72 y.o., female Today's Date: 06/22/2023   PCP: Dr. Manson Allan REFERRING PROVIDER: Dr. Cristopher Peru  END OF SESSION:  PT End of Session - 06/22/23 1446     Visit Number 10    Number of Visits 24    Date for PT Re-Evaluation 07/15/23    Progress Note Due on Visit 10    PT Start Time 1446    PT Stop Time 1528    PT Time Calculation (min) 42 min    Equipment Utilized During Treatment Gait belt    Activity Tolerance Patient tolerated treatment well    Behavior During Therapy WFL for tasks assessed/performed                  Past Medical History:  Diagnosis Date   Breast cancer (HCC)    bilateral   Chronic constipation    GERD (gastroesophageal reflux disease)    Osteoarthritis    Parkinson disease    Personal history of chemotherapy    Personal history of radiation therapy    PONV (postoperative nausea and vomiting)    Psoriatic arthritis (HCC)    Pulmonary embolism (HCC) 2000   following mastectomy   Past Surgical History:  Procedure Laterality Date   BREAST LUMPECTOMY Right 2005   CATARACT EXTRACTION Bilateral    COLONOSCOPY WITH PROPOFOL N/A 04/10/2022   Procedure: COLONOSCOPY WITH PROPOFOL;  Surgeon: Regis Bill, MD;  Location: [facility] ENDOSCOPY;  Service: Endoscopy;  Laterality: N/A;   INGUINAL HERNIA REPAIR Right 2018   MASTECTOMY Left 2000   MYOMECTOMY     TOTAL KNEE ARTHROPLASTY Left    Patient Active Problem List   Diagnosis Date Noted   Pain and swelling of left lower leg 01/25/2023   Arthralgia of multiple sites 01/26/2022   Asthenia 01/26/2022   Gastroesophageal reflux disease 01/26/2022   History of mastectomy 01/26/2022   History of myomectomy 01/26/2022   Malignant tumor of breast (HCC) 01/26/2022   Postoperative retention of urine  01/26/2022   History of pulmonary embolus (PE) 01/26/2022   Constipation 01/08/2022   History of colonic polyps 01/08/2022   History of total knee arthroplasty 06/19/2021   History of breast cancer in female 06/02/2021   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   REFERRING DIAG: Parkinson's disease  THERAPY DIAG:  No diagnosis found.  Rationale for Evaluation and Treatment: Rehabilitation  SUBJECTIVE:  SUBJECTIVE STATEMENT:  Patient reports having increased tremors this date but overall feeling okay with no significant changes since last visit.   Pt accompanied by: self  PERTINENT HISTORY: Patient is a 72 year old female with referral for Parkinsons disease. She was diagnosed in 2015 and per [name], PA report from visit on 01/20/2023: She reports her balance has been poor as of late. This is why she has started using a walker on a regular basis. She feels she cannot turn around fast and needs to hold on to something when she does. Last fall was around Christmastime. She states since then she has been especially cautious with her gait. She has also noticed some new dyskinesias over the past six-eight months. She does not feel it is worse at a specific time of day or with relation to medication timing. Her tremor is worst in her right  foot, especially when she relaxes at night. She does feel like she is moving slowly overall. She notes all of her symptoms are worse with anxiety and feeling rushed. Because of her balance and dyskinesias, she feels like she is worse overall than at her last appointment.  She has not noticed when her medication wears off. She does not typically notice when her medication kicks in, either.  She notes she is recovering from knee replacement surgery, and this is making her left leg stiff. Otherwise, she does not feel any stiffness. She also has psoriatic arthritis.  She reports feelings of depression and anxiety. She notes she lost her mother in 2022, just before she moved here. She was very close with her. She notes not being able to get out and about has also contributed.  She last had PT for her knee replacement, never for PD.    PAIN:  Are you having pain?  Tightness Left knee- wearing compression stocking  PRECAUTIONS: Fall  WEIGHT BEARING RESTRICTIONS: No  FALLS: Has patient fallen in last 6 months?  Last fall was Dec 2023 - while trying to decorate her Christmas tree.   LIVING ENVIRONMENT: Lives with: lives alone Lives in: House/apartment Stairs:  2 level home- basement (does not currently use) steps from garage- 3 with rail on right Has following equipment at home: Single point cane, Walker - 2 wheeled, and [employer] - 4 wheeled  PLOF: Independent  PATIENT GOALS: Improve my balance and loosen up my left knee  OBJECTIVE:   DIAGNOSTIC FINDINGS: EXAM: LEFT KNEE - COMPLETE 4+ VIEW   COMPARISON:  None Available.   FINDINGS: Total knee prosthesis observed, with proximally 1.5 mm lucency along the lateral tibial tray component on the frontal projection (frontal zones 3 and 4). Equivocal/minimal lucency along the methacrylate-bone interface along the femoral component in zone 5. The appearance suggests mild findings of loosening.   Small knee effusion noted.   IMPRESSION: 1.  Mild lucency along the lateral tibial tray component (zones 3 and 4) and along the Methacrylate-bone interface along the femoral component (lateral zone 5), suggesting mild loosening. 2. Small knee effusion.     Electronically Signed   By: Gaylyn Rong M.D.   On: 12/31/2022 17:16    COGNITION: Overall cognitive status: Within functional limits for tasks assessed   SENSATION: WFL  COORDINATION: Slow initiation of movement  EDEMA:  None present    POSTURE: rounded shoulders and forward head  LOWER EXTREMITY ROM:     Active  Right Eval Left Eval  Hip flexion    Hip extension    Hip  abduction    Hip adduction    Hip internal rotation    Hip external rotation    Knee flexion 115 88  Knee extension    Ankle dorsiflexion    Ankle plantarflexion    Ankle inversion    Ankle eversion     (Blank rows = not tested)  LOWER EXTREMITY MMT:    MMT Right Eval Left Eval  Hip flexion 4 4  Hip extension 4 4  Hip abduction 4 4  Hip adduction 4 4  Hip internal rotation 4 4  Hip external rotation 4 4  Knee flexion 4 4  Knee extension 4 2-  Ankle dorsiflexion 4 4  Ankle plantarflexion    Ankle inversion    Ankle eversion    (Blank rows = not tested)  TRANSFERS: Assistive device utilized: Single point cane  Sit to stand: CGA Stand to sit: CGA Chair to chair: CGA Floor:  Not assessed   GAIT: Gait pattern: step through pattern, decreased arm swing- Right, decreased arm swing- Left, decreased step length- Right, and decreased step length- Left Distance walked: 730 Assistive device utilized: Single point cane Level of assistance: CGA Comments: decreased overall step length- no observed shuffling  FUNCTIONAL TESTS:  5 times sit to stand: 53.48 sec with UE support * Left knee pain Timed up and go (TUG): 25.52 with cane 6 minute walk test: 730 feet with cane 10 meter walk test: 0.62 m/s  PATIENT SURVEYS:  FOTO 49 with goal of 59  TODAY'S TREATMENT:                                                                                                                               Physical therapy treatment session today consisted of completing assessment of goals and administration of testing as demonstrated and documented in flow sheet, treatment, and goals section of this note. Addition treatments may be found below.   10 Meter Walk Test: Patient instructed to walk 10 meters (32.8 ft) as quickly and as safely as possible at their normal speed x2 Time measured from 2 meter mark to 8 meter mark to accommodate ramp-up and ramp-down.  Normal speed 1: .68 m/s Normal speed 2: .79 m/s Average Normal speed: .74 m/s Cut off scores: <0.4 m/s = household Ambulator, 0.4-0.8 m/s = limited community Ambulator, >0.8 m/s = community Ambulator, >1.2 m/s = crossing a street, <1.0 = increased fall risk MCID 0.05 m/s (small), 0.13 m/s (moderate) (ANPTA Core Set of Outcome Measures for Adults with Neurologic Conditions, 2018)  PT instructed pt in TUG: 22.95 sec sec ( >13.5 sec indicates increased fall risk)  Knee ROM: 90 degrees on L   FGA:  [facility] PT Assessment - 06/22/23 0001       Berg Balance Test   Sit to Stand Able to stand  independently using hands    Standing Unsupported Able to stand safely 2 minutes    Sitting with Back Unsupported  but Feet Supported on Floor or Stool Able to sit safely and securely 2 minutes    Stand to Sit Controls descent by using hands    Transfers Able to transfer safely, definite need of hands    Standing Unsupported with Eyes Closed Able to stand 10 seconds safely    Standing Unsupported with Feet Together Able to place feet together independently and stand 1 minute safely    From Standing, Reach Forward with Outstretched Arm Can reach forward >12 cm safely (5")    From Standing Position, Pick up Object from Floor Able to pick up shoe, needs supervision    From Standing Position, Turn to Look Behind Over each Shoulder Looks behind  from both sides and weight shifts well    Turn 360 Degrees Able to turn 360 degrees safely but slowly    Standing Unsupported, Alternately Place Feet on Step/Stool Able to stand independently and complete 8 steps >20 seconds    Standing Unsupported, One Foot in Front Able to plae foot ahead of the other independently and hold 30 seconds    Standing on One Leg Tries to lift leg/unable to hold 3 seconds but remains standing independently    Total Score 44      Functional Gait  Assessment   Gait Level Surface Walks 20 ft in less than 7 sec but greater than 5.5 sec, uses assistive device, slower speed, mild gait deviations, or deviates 6-10 in outside of the 12 in walkway width.    Change in Gait Speed Able to change speed, demonstrates mild gait deviations, deviates 6-10 in outside of the 12 in walkway width, or no gait deviations, unable to achieve a major change in velocity, or uses a change in velocity, or uses an assistive device.    Gait with Horizontal Head Turns Performs head turns smoothly with slight change in gait velocity (eg, minor disruption to smooth gait path), deviates 6-10 in outside 12 in walkway width, or uses an assistive device.    Gait with Vertical Head Turns Performs task with slight change in gait velocity (eg, minor disruption to smooth gait path), deviates 6 - 10 in outside 12 in walkway width or uses assistive device    Gait and Pivot Turn Pivot turns safely in greater than 3 sec and stops with no loss of balance, or pivot turns safely within 3 sec and stops with mild imbalance, requires small steps to catch balance.    Step Over Obstacle Cannot perform without assistance.    Gait with Narrow Base of Support Ambulates 4-7 steps.    Gait with Eyes Closed Walks 20 ft, uses assistive device, slower speed, mild gait deviations, deviates 6-10 in outside 12 in walkway width. Ambulates 20 ft in less than 9 sec but greater than 7 sec.    Ambulating Backwards Walks 20 ft, uses  assistive device, slower speed, mild gait deviations, deviates 6-10 in outside 12 in walkway width.    Steps Alternating feet, must use rail.    Total Score 17             Patient demonstrates increased fall risk as noted by score of  44 /56 on Berg Balance Scale.  (<36= high risk for falls, close to 100%; 37-45 significant >80%; 46-51 moderate >50%; 52-55 lower >25%)    Note: Portions of this document were prepared using Dragon voice recognition software and although reviewed may contain unintentional dictation errors in syntax, grammar, or spelling.    PATIENT EDUCATION:  Education details: Goals, Functional outcome measures, Purpose of PT Person educated: Patient Education method: Explanation Education comprehension: verbalized understanding  HOME EXERCISE PROGRAM: Access Code: [email or web address] URL: [email or web address] Date: 04/23/2023 Prepared by: Maureen Ralphs  Exercises - Seated Knee Flexion Stretch  - 2 x daily - 3-4 sets - 20-30 sec hold - Seated Hamstring Stretch  - 2 x daily - 3-4 sets - 20-30 hold    -PWR! Seated exercises handout provided and reviewed each exercise GOALS: Goals reviewed with patient? Yes  SHORT TERM GOALS: Target date: 06/03/2023  Pt will be independent with HEP in order to improve strength and balance in order to decrease fall risk and improve function at home and work.  Baseline: EVAL: No formal HEP in place Goal status: MET   LONG TERM GOALS: Target date: 07/15/2023  Pt will improve FOTO to target score of 59 to display perceived improvements in ability to complete ADL's.  Baseline: EVAL: 49  8/20:53.2 Goal status: IN PROGRESS  2.  Pt will increase by at least 0.23 m/s in order to demonstrate clinically significant improvement in community ambulation.   Baseline: EVAL= 16.23 sec with cane or 0.62 m/s  8/20: .74 m/s Goal status: INITIAL  3.  Pt will decrease TUG to below 17 seconds/decrease in order to demonstrate  decreased fall risk. Baseline: EVAL: 25.52 sec 8/20:22.95 sec  Goal status: IN PROGRESS  4.  Pt will decrease 5TSTS by at least 20 seconds in order to demonstrate clinically significant improvement in LE strength.  Baseline: EVAL: 53.48 sec with B UE support 06/22/23: 31.27 sec  Goal status: IN PROGRESS  5.  Pt will increase by at least 31m (162ft) in order to demonstrate clinically significant improvement in cardiopulmonary endurance and community ambulation  Baseline: EVAL: 730 feet  8/20: 845 ft no AD  Goal status: IN PROGRESS  6. Pt will demonstrate improved left knee AROM knee flex > 100 deg for improved functional mobility including transfers and walking.   Baseline: EVAL: 88 deg  8/20:90 degrees   Goal status: In Progress  7. Pt will improve FGA by at least 3 points in order to demonstrate clinically significant improvement in balance and decreased risk for falls.  Baseline: 04/29/2023=17/30  06/22/23: 17 Goal status: Ongoing   8. Pt will improve BERG by at least 3 points in order to demonstrate clinically significant improvement in balance.  Baseline: 04/29/2023= 35/56  06/22/23: :44  Goal Status: Ongoing   ASSESSMENT:  CLINICAL IMPRESSION:  Patient presents to physical therapy for progress note this date.  Patient's functional goals patient shows progress toward all of them including meeting her Sharlene Motts balance test goal showing improved balance.  Patient ambulating at improved speed as well as ambulating and increased distance with 6-minute walk test.  Although patient is making a lot of progress patient is still progressing toward many of her long-term goals to decrease her risk of falls as well as to improve her mobility and quality of life.  Patient still lacking in knee range of motion at this time this is only goal that continues to lack in comparison with others.  Will continue to work toward improving knee range of motion as this does impact patient's ability to perform  sit to stands as well as other transitions and transfers. Patient's condition has the potential to improve in response to therapy. Maximum improvement is yet to be obtained. The anticipated improvement is attainable and reasonable in a generally predictable time.  Pt will continue  to benefit from skilled physical therapy intervention to address impairments, improve QOL, and attain therapy goals.    OBJECTIVE IMPAIRMENTS: Abnormal gait, cardiopulmonary status limiting activity, decreased activity tolerance, decreased balance, decreased coordination, decreased endurance, decreased mobility, difficulty walking, decreased ROM, decreased strength, hypomobility, impaired flexibility, and pain.   ACTIVITY LIMITATIONS: carrying, lifting, bending, sitting, standing, squatting, sleeping, stairs, and transfers  PARTICIPATION LIMITATIONS: cleaning, laundry, shopping, community activity, and yard work  PERSONAL FACTORS: Age and 1-2 comorbidities: Arthritis, Breast cancer, TKA  are also affecting patient's functional outcome.   REHAB POTENTIAL: Good  CLINICAL DECISION MAKING: Stable/uncomplicated  EVALUATION COMPLEXITY: Low  PLAN:  PT FREQUENCY: 1-2x/week  PT DURATION: 12 weeks  PLANNED INTERVENTIONS: Therapeutic exercises, Therapeutic activity, Neuromuscular re-education, Balance training, Gait training, Patient/Family education, Self Care, Joint mobilization, Stair training, Vestibular training, Canalith repositioning, DME instructions, Dry Needling, Electrical stimulation, Spinal manipulation, Spinal mobilization, Cryotherapy, Moist heat, Manual therapy, and Re-evaluation  PLAN FOR NEXT SESSION:Therex For Bilateral Knee ROM/Strength; balance and gait training.  Work on transfers to empower more use of Left LE. Incorporation of large amplitude movements when appropriate.   Norman Herrlich, PT 06/22/2023, 4:59 PM

## 2023-06-24 ENCOUNTER — Ambulatory Visit: Payer: Medicare Other | Admitting: Physical Therapy

## 2023-06-24 DIAGNOSIS — R2681 Unsteadiness on feet: Secondary | ICD-10-CM

## 2023-06-24 DIAGNOSIS — R262 Difficulty in walking, not elsewhere classified: Secondary | ICD-10-CM

## 2023-06-24 DIAGNOSIS — R269 Unspecified abnormalities of gait and mobility: Secondary | ICD-10-CM

## 2023-06-24 DIAGNOSIS — R2689 Other abnormalities of gait and mobility: Secondary | ICD-10-CM

## 2023-06-24 NOTE — Therapy (Signed)
OUTPATIENT PHYSICAL THERAPY NEURO TREATMENT    Patient Name: Dorothy Mccoy MRN: [id number] DOB:1951/01/11, 72 y.o., female Today's Date: 06/24/2023   PCP: Dr. Manson Allan REFERRING PROVIDER: Dr. Cristopher Peru  END OF SESSION:  PT End of Session - 06/24/23 1451     Visit Number 11    Number of Visits 24    Date for PT Re-Evaluation 07/15/23    Progress Note Due on Visit 10    PT Start Time 1448    PT Stop Time 1528    PT Time Calculation (min) 40 min    Equipment Utilized During Treatment Gait belt    Activity Tolerance Patient tolerated treatment well    Behavior During Therapy WFL for tasks assessed/performed                   Past Medical History:  Diagnosis Date   Breast cancer (HCC)    bilateral   Chronic constipation    GERD (gastroesophageal reflux disease)    Osteoarthritis    Parkinson disease    Personal history of chemotherapy    Personal history of radiation therapy    PONV (postoperative nausea and vomiting)    Psoriatic arthritis (HCC)    Pulmonary embolism (HCC) 2000   following mastectomy   Past Surgical History:  Procedure Laterality Date   BREAST LUMPECTOMY Right 2005   CATARACT EXTRACTION Bilateral    COLONOSCOPY WITH PROPOFOL N/A 04/10/2022   Procedure: COLONOSCOPY WITH PROPOFOL;  Surgeon: Regis Bill, MD;  Location: [facility] END[facility]COPY;  Service: Endoscopy;  Laterality: N/A;   INGUINAL HERNIA REPAIR Right 2018   MASTECTOMY Left 2000   MYOMECTOMY     TOTAL KNEE ARTHROPLASTY Left    Patient Active Problem List   Diagnosis Date Noted   Pain and swelling of left lower leg 01/25/2023   Arthralgia of multiple sites 01/26/2022   Asthenia 01/26/2022   Gastroesophageal reflux disease 01/26/2022   History of mastectomy 01/26/2022   History of myomectomy 01/26/2022   Malignant tumor of breast (HCC) 01/26/2022   Postoperative retention of urine 01/26/2022   History of pulmonary embolus (PE) 01/26/2022   Constipation 01/08/2022    History of colonic polyps 01/08/2022   History of total knee arthroplasty 06/19/2021   History of breast cancer in female 06/02/2021   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   REFERRING DIAG: Parkinson's disease  THERAPY DIAG:  Difficulty in walking, not elsewhere classified  Abnormality of gait and mobility  Other abnormalities of gait and mobility  Unsteadiness on feet  Rationale for Evaluation and Treatment: Rehabilitation  SUBJECTIVE:  SUBJECTIVE STATEMENT:  Pt reports doing well today. Pt denies any recent falls/stumbles since prior session. Pt denies any updates to medications or medical appointment since prior session. Pt reports good compliance with HEP when time permits.    Pt accompanied by: self  PERTINENT HISTORY: Patient is a 72 year old female with referral for Parkinsons disease. She was diagnosed in 2015 and per [name], PA report from visit on 01/20/2023: She reports her balance has been poor as of late. This is why she has started using a walker on a regular basis. She feels she cannot turn around fast and needs to hold on to something when she does. Last fall was around Christmastime. She states since then she has been especially cautious with her gait. She has also noticed some new dyskinesias over the past  six-eight months. She does not feel it is worse at a specific time of day or with relation to medication timing. Her tremor is worst in her right foot, especially when she relaxes at night. She does feel like she is moving slowly overall. She notes all of her symptoms are worse with anxiety and feeling rushed. Because of her balance and dyskinesias, she feels like she is worse overall than at her last appointment.  She has not noticed when her medication wears off. She does not typically notice when her medication kicks in, either.  She notes she is recovering from knee replacement surgery, and this is making her left leg stiff. Otherwise, she does not feel any stiffness. She also has psoriatic arthritis.  She reports feelings of depression and anxiety. She notes she lost her mother in 2022, just before she moved here. She was very close with her. She notes not being able to get out and about has also contributed.  She last had PT for her knee replacement, never for PD.    PAIN:  Are you having pain?  Tightness Left knee- wearing compression stocking  PRECAUTIONS: Fall  WEIGHT BEARING RESTRICTIONS: No  FALLS: Has patient fallen in last 6 months?  Last fall was Dec 2023 - while trying to decorate her Christmas tree.   LIVING ENVIRONMENT: Lives with: lives alone Lives in: House/apartment Stairs:  2 level home- basement (does not currently use) steps from garage- 3 with rail on right Has following equipment at home: Single point cane, Walker - 2 wheeled, and [employer] - 4 wheeled  PLOF: Independent  PATIENT GOALS: Improve my balance and loosen up my left knee  OBJECTIVE:   DIAGNOSTIC FINDINGS: EXAM: LEFT KNEE - COMPLETE 4+ VIEW   COMPARISON:  None Available.   FINDINGS: Total knee prosthesis observed, with proximally 1.5 mm lucency along the lateral tibial tray component on the frontal projection (frontal zones 3 and 4). Equivocal/minimal lucency along the methacrylate-bone  interface along the femoral component in zone 5. The appearance suggests mild findings of loosening.   Small knee effusion noted.   IMPRESSION: 1. Mild lucency along the lateral tibial tray component (zones 3 and 4) and along the Methacrylate-bone interface along the femoral component (lateral zone 5), suggesting mild loosening. 2. Small knee effusion.     Electronically Signed   By: Gaylyn Rong M.D.   On: 12/31/2022 17:16    COGNITION: Overall cognitive status: Within functional limits for tasks assessed   SENSATION: WFL  COORDINATION: Slow initiation of movement  EDEMA:  None present    POSTURE: rounded shoulders and forward head  LOWER EXTREMITY ROM:     Active  Right Eval Left Eval  Hip flexion    Hip extension    Hip abduction    Hip adduction    Hip internal rotation    Hip external rotation    Knee flexion 115 88  Knee extension    Ankle dorsiflexion    Ankle plantarflexion    Ankle inversion    Ankle eversion     (Blank rows = not tested)  LOWER EXTREMITY MMT:    MMT Right Eval Left Eval  Hip flexion 4 4  Hip extension 4 4  Hip abduction 4 4  Hip adduction 4 4  Hip internal rotation 4 4  Hip external rotation 4 4  Knee flexion 4 4  Knee extension 4 2-  Ankle dorsiflexion 4 4  Ankle plantarflexion    Ankle inversion    Ankle eversion    (Blank rows = not tested)  TRANSFERS: Assistive device utilized: Single point cane  Sit to stand: CGA Stand to sit: CGA Chair to chair: CGA Floor:  Not assessed   GAIT: Gait pattern: step through pattern, decreased arm swing- Right, decreased arm swing- Left, decreased step length- Right, and decreased step length- Left Distance walked: 730 Assistive device utilized: Single point cane Level of assistance: CGA Comments: decreased overall step length- no observed shuffling  FUNCTIONAL TESTS:  5 times sit to stand: 53.48 sec with UE support * Left knee pain Timed up and go (TUG): 25.52  with cane 6 minute walk test: 730 feet with cane 10 meter walk test: 0.62 m/s  PATIENT SURVEYS:  FOTO 49 with goal of 59  TODAY'S TREATMENT:                                                                                                                              TE Nustep, challenging knee flexion -started seat levl 10 at end range L knee flexion then progressed to level 9 at 3 min to increase knee flexion angle at end range of nustep movement   Manual Patellar mobs ( superior grade 3) in multiple ranges of motion  Tib-fem mobs Grade 3/4 x many repetitions  Contract relax (agonist) x 5 x 5 seconds for improved knee flexion Knee PROM with end range holds, able to achieve 95 degrees knee flexion in supine  95 degrees knee flexion following ( 5 degrees more than last session wihtout treatment)   NMR:   Ambulation with obstacles course stepping over obstacles of various heights   STS with PWR! Step FLOW 2 x 5 reps, slow eccentric control to work on functional knee flexion with gravity assist. Cues for large forward weight shift helped significantly    Note: Portions of this document were prepared using Dragon voice recognition software and although reviewed may contain unintentional dictation errors in syntax, grammar, or spelling.    PATIENT EDUCATION: Education details: Goals, Functional outcome measures, Purpose of PT Person educated: Patient Education method: Explanation Education comprehension: verbalized understanding  HOME EXERCISE PROGRAM: Access Code: [email or web address] URL: [email or web address] Date: 04/23/2023 Prepared by: Maureen Ralphs  Exercises - Seated Knee Flexion Stretch  - 2 x daily - 3-4 sets - 20-30 sec hold - Seated Hamstring Stretch  - 2 x daily - 3-4 sets - 20-30 hold    -PWR! Seated exercises handout provided and reviewed each exercise GOALS: Goals reviewed with patient? Yes  SHORT TERM GOALS: Target date: 06/03/2023  Pt will be  independent with HEP in order to improve strength and balance in order to decrease fall risk and improve function at home and work.  Baseline: EVAL: No formal HEP in place Goal status: MET   LONG TERM GOALS: Target date: 07/15/2023  Pt will improve FOTO to target score of 59 to display perceived improvements in ability to complete ADL's.  Baseline: EVAL: 49  8/20:53.2 Goal status: IN PROGRESS  2.  Pt will increase by at least 0.23 m/s in order to demonstrate clinically significant improvement in community ambulation.   Baseline: EVAL= 16.23 sec with cane or 0.62 m/s  8/20: .74 m/s Goal status: INITIAL  3.  Pt will decrease TUG to below 17 seconds/decrease in order to demonstrate decreased fall risk. Baseline: EVAL: 25.52 sec 8/20:22.95 sec  Goal status: IN PROGRESS  4.  Pt will decrease 5TSTS by at least 20 seconds in order to demonstrate clinically significant improvement in LE strength.  Baseline: EVAL: 53.48 sec with B UE support 06/22/23: 31.27 sec  Goal status: IN PROGRESS  5.  Pt will increase by at least 20m (155ft) in order to demonstrate clinically significant improvement in cardiopulmonary endurance and community ambulation  Baseline: EVAL: 730 feet  8/20: 845 ft no AD  Goal status: IN PROGRESS  6. Pt will demonstrate improved left knee AROM knee flex > 100 deg for improved functional mobility including transfers and walking.   Baseline: EVAL: 88 deg  8/20:90 degrees   Goal status: In Progress  7. Pt will improve FGA by at least 3 points in order to demonstrate clinically significant improvement in balance and decreased risk for falls.  Baseline: 04/29/2023=17/30  06/22/23: 17 Goal status: Ongoing   8. Pt will improve BERG by at least 3 points in order to demonstrate clinically significant improvement in balance.  Baseline: 04/29/2023= 35/56  06/22/23: :44  Goal Status: Ongoing   ASSESSMENT:  CLINICAL IMPRESSION:  Patient presents with good motivation  for completion of physical therapy activities.  Patient challenged with several exercises and manual therapies to improve left knee flexion range of motion.  Overall this was successful achieving greater range of motion than measured at previous progress note.  Is important to note that previous progress note patient was not complaining any of the stretches or activities we did to achieve this motion today.  Patient instructed to continue to work on knee flexion range of motion at home and particularly on days after therapy where her knee range of motion may be better than typical days.  Patient did well with obstacle navigation stepping over several obstacles without freezing of gait or loss of balance compared to previous session for patient had freezing of gait when stepping over shoe box with gait and balance testing.Pt will continue to benefit from skilled physical therapy intervention to address impairments, improve QOL, and attain therapy goals.    OBJECTIVE IMPAIRMENTS: Abnormal gait, cardiopulmonary status limiting activity, decreased activity tolerance, decreased balance, decreased coordination, decreased endurance, decreased mobility, difficulty walking, decreased ROM, decreased strength, hypomobility, impaired flexibility,  and pain.   ACTIVITY LIMITATIONS: carrying, lifting, bending, sitting, standing, squatting, sleeping, stairs, and transfers  PARTICIPATION LIMITATIONS: cleaning, laundry, shopping, community activity, and yard work  PERSONAL FACTORS: Age and 1-2 comorbidities: Arthritis, Breast cancer, TKA  are also affecting patient's functional outcome.   REHAB POTENTIAL: Good  CLINICAL DECISION MAKING: Stable/uncomplicated  EVALUATION COMPLEXITY: Low  PLAN:  PT FREQUENCY: 1-2x/week  PT DURATION: 12 weeks  PLANNED INTERVENTIONS: Therapeutic exercises, Therapeutic activity, Neuromuscular re-education, Balance training, Gait training, Patient/Family education, Self Care, Joint  mobilization, Stair training, Vestibular training, Canalith repositioning, DME instructions, Dry Needling, Electrical stimulation, Spinal manipulation, Spinal mobilization, Cryotherapy, Moist heat, Manual therapy, and Re-evaluation  PLAN FOR NEXT SESSION:Therex For Bilateral Knee ROM/Strength; balance and gait training.  Work on transfers to empower more use of Left LE. Incorporation of large amplitude movements when appropriate.   Norman Herrlich, PT 06/24/2023, 2:51 PM

## 2023-06-29 ENCOUNTER — Ambulatory Visit: Payer: Medicare Other | Admitting: Physical Therapy

## 2023-06-29 DIAGNOSIS — R262 Difficulty in walking, not elsewhere classified: Secondary | ICD-10-CM | POA: Diagnosis not present

## 2023-06-29 DIAGNOSIS — M6281 Muscle weakness (generalized): Secondary | ICD-10-CM

## 2023-06-29 DIAGNOSIS — R2681 Unsteadiness on feet: Secondary | ICD-10-CM

## 2023-06-29 DIAGNOSIS — R2689 Other abnormalities of gait and mobility: Secondary | ICD-10-CM

## 2023-06-29 DIAGNOSIS — R269 Unspecified abnormalities of gait and mobility: Secondary | ICD-10-CM

## 2023-06-29 NOTE — Therapy (Signed)
OUTPATIENT PHYSICAL THERAPY NEURO TREATMENT    Patient Name: Dorothy Mccoy MRN: [id number] DOB:1951-06-01, 72 y.o., female Today's Date: 06/29/2023   PCP: Dr. Manson Allan REFERRING PROVIDER: Dr. Cristopher Peru  END OF SESSION:  PT End of Session - 06/29/23 1443     Visit Number 12    Number of Visits 24    Date for PT Re-Evaluation 07/15/23    Progress Note Due on Visit 10    PT Start Time 1438    PT Stop Time 1521    PT Time Calculation (min) 43 min    Equipment Utilized During Treatment Gait belt    Activity Tolerance Patient tolerated treatment well    Behavior During Therapy WFL for tasks assessed/performed                    Past Medical History:  Diagnosis Date   Breast cancer (HCC)    bilateral   Chronic constipation    GERD (gastroesophageal reflux disease)    Osteoarthritis    Parkinson disease    Personal history of chemotherapy    Personal history of radiation therapy    PONV (postoperative nausea and vomiting)    Psoriatic arthritis (HCC)    Pulmonary embolism (HCC) 2000   following mastectomy   Past Surgical History:  Procedure Laterality Date   BREAST LUMPECTOMY Right 2005   CATARACT EXTRACTION Bilateral    COLONOSCOPY WITH PROPOFOL N/A 04/10/2022   Procedure: COLONOSCOPY WITH PROPOFOL;  Surgeon: Regis Bill, MD;  Location: [facility] ENDOSCOPY;  Service: Endoscopy;  Laterality: N/A;   INGUINAL HERNIA REPAIR Right 2018   MASTECTOMY Left 2000   MYOMECTOMY     TOTAL KNEE ARTHROPLASTY Left    Patient Active Problem List   Diagnosis Date Noted   Pain and swelling of left lower leg 01/25/2023   Arthralgia of multiple sites 01/26/2022   Asthenia 01/26/2022   Gastroesophageal reflux disease 01/26/2022   History of mastectomy 01/26/2022   History of myomectomy 01/26/2022   Malignant tumor of breast (HCC) 01/26/2022   Postoperative retention of urine 01/26/2022   History of pulmonary embolus (PE) 01/26/2022   Constipation 01/08/2022    History of colonic polyps 01/08/2022   History of total knee arthroplasty 06/19/2021   History of breast cancer in female 06/02/2021   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   REFERRING DIAG: Parkinson's disease  THERAPY DIAG:  Difficulty in walking, not elsewhere classified  Abnormality of gait and mobility  Other abnormalities of gait and mobility  Unsteadiness on feet  Muscle weakness (generalized)  Rationale for Evaluation and Treatment: Rehabilitation  SUBJECTIVE:  SUBJECTIVE STATEMENT:  Pt reports doing well today. Pt denies any recent falls/stumbles since prior session. Pt denies any updates to medications or medical appointment since prior session. Pt reports good compliance with HEP when time permits.    Pt accompanied by: self  PERTINENT HISTORY: Patient is a 72 year old female with referral for Parkinsons disease. She was diagnosed in 2015 and per [name], PA report from visit on 01/20/2023: She reports her balance has been poor as of late. This is why she has started using a walker on a regular basis. She feels she cannot turn around fast and needs to hold on to something when she does. Last fall was around Christmastime. She states since then she has been especially cautious with her gait. She has also noticed some  new dyskinesias over the past six-eight months. She does not feel it is worse at a specific time of day or with relation to medication timing. Her tremor is worst in her right foot, especially when she relaxes at night. She does feel like she is moving slowly overall. She notes all of her symptoms are worse with anxiety and feeling rushed. Because of her balance and dyskinesias, she feels like she is worse overall than at her last appointment.  She has not noticed when her medication wears off. She does not typically notice when her medication kicks in, either.  She notes she is recovering from knee replacement surgery, and this is making her left leg stiff. Otherwise, she does not feel any stiffness. She also has psoriatic arthritis.  She reports feelings of depression and anxiety. She notes she lost her mother in 2022, just before she moved here. She was very close with her. She notes not being able to get out and about has also contributed.  She last had PT for her knee replacement, never for PD.    PAIN:  Yes, 7/10 pain in the Right knee   PRECAUTIONS: Fall  WEIGHT BEARING RESTRICTIONS: No  FALLS: Has patient fallen in last 6 months?  Last fall was Dec 2023 - while trying to decorate her Christmas tree.   LIVING ENVIRONMENT: Lives with: lives alone Lives in: House/apartment Stairs:  2 level home- basement (does not currently use) steps from garage- 3 with rail on right Has following equipment at home: Single point cane, Walker - 2 wheeled, and [employer] - 4 wheeled  PLOF: Independent  PATIENT GOALS: Improve my balance and loosen up my left knee  OBJECTIVE:   DIAGNOSTIC FINDINGS: EXAM: LEFT KNEE - COMPLETE 4+ VIEW   COMPARISON:  None Available.   FINDINGS: Total knee prosthesis observed, with proximally 1.5 mm lucency along the lateral tibial tray component on the frontal projection (frontal zones 3 and 4). Equivocal/minimal lucency along the methacrylate-bone interface  along the femoral component in zone 5. The appearance suggests mild findings of loosening.   Small knee effusion noted.   IMPRESSION: 1. Mild lucency along the lateral tibial tray component (zones 3 and 4) and along the Methacrylate-bone interface along the femoral component (lateral zone 5), suggesting mild loosening. 2. Small knee effusion.     Electronically Signed   By: Gaylyn Rong M.D.   On: 12/31/2022 17:16    COGNITION: Overall cognitive status: Within functional limits for tasks assessed   SENSATION: WFL  COORDINATION: Slow initiation of movement  EDEMA:  None present    POSTURE: rounded shoulders and forward head  LOWER EXTREMITY ROM:     Active  Right Eval  Left Eval  Hip flexion    Hip extension    Hip abduction    Hip adduction    Hip internal rotation    Hip external rotation    Knee flexion 115 88  Knee extension    Ankle dorsiflexion    Ankle plantarflexion    Ankle inversion    Ankle eversion     (Blank rows = not tested)  LOWER EXTREMITY MMT:    MMT Right Eval Left Eval  Hip flexion 4 4  Hip extension 4 4  Hip abduction 4 4  Hip adduction 4 4  Hip internal rotation 4 4  Hip external rotation 4 4  Knee flexion 4 4  Knee extension 4 2-  Ankle dorsiflexion 4 4  Ankle plantarflexion    Ankle inversion    Ankle eversion    (Blank rows = not tested)  TRANSFERS: Assistive device utilized: Single point cane  Sit to stand: CGA Stand to sit: CGA Chair to chair: CGA Floor:  Not assessed   GAIT: Gait pattern: step through pattern, decreased arm swing- Right, decreased arm swing- Left, decreased step length- Right, and decreased step length- Left Distance walked: 730 Assistive device utilized: Single point cane Level of assistance: CGA Comments: decreased overall step length- no observed shuffling  FUNCTIONAL TESTS:  5 times sit to stand: 53.48 sec with UE support * Left knee pain Timed up and go (TUG): 25.52 with  cane 6 minute walk test: 730 feet with cane 10 meter walk test: 0.62 m/s  PATIENT SURVEYS:  FOTO 49 with goal of 59  TODAY'S TREATMENT:                                                                                                                               TE Nustep, challenging knee flexion -started seat levl 10 at end range L knee flexion then progressed to level 9 at 3 min to increase knee flexion angle at end range of nustep movement  -Pt reports L knee flexion felt okay but right knee continues to be bigger source of pain.   Seated HS curl with RTB cues for slow eccentric control. X 10 on elevated plinth to allow greater ROM.   STS 2 x 5 reps, slow eccentric control to work on functional knee flexion with gravity assist. Cues for large forward weight shift helped significantly   Manual Patellar mobs ( superior grade 3) in multiple ranges of motion  Tib-fem mobs Grade 3/4 x many repetitions  Contract relax (agonist) x 5 x 5 seconds for improved knee flexion with 60 sec hold at end range on last rep  Knee PROM with end range holds, able to achieve 95 degrees knee flexion in supine  95 degrees knee flexion following ( 5 degrees more than last session wihtout treatment)   NMR:   Ambulation with obstacles course stepping over obstacles of various heights ( 1/2 foam rollers, canes, hurdles) x  4 extensions     Note: Portions of this document were prepared using Dragon voice recognition software and although reviewed may contain unintentional dictation errors in syntax, grammar, or spelling.    PATIENT EDUCATION: Education details: Goals, Functional outcome measures, Purpose of PT Person educated: Patient Education method: Explanation Education comprehension: verbalized understanding  HOME EXERCISE PROGRAM: Access Code: [email or web address] URL: [email or web address] Date: 04/23/2023 Prepared by: Maureen Ralphs  Exercises - Seated Knee Flexion Stretch  - 2 x  daily - 3-4 sets - 20-30 sec hold - Seated Hamstring Stretch  - 2 x daily - 3-4 sets - 20-30 hold    -PWR! Seated exercises handout provided and reviewed each exercise GOALS: Goals reviewed with patient? Yes  SHORT TERM GOALS: Target date: 06/03/2023  Pt will be independent with HEP in order to improve strength and balance in order to decrease fall risk and improve function at home and work.  Baseline: EVAL: No formal HEP in place Goal status: MET   LONG TERM GOALS: Target date: 07/15/2023  Pt will improve FOTO to target score of 59 to display perceived improvements in ability to complete ADL's.  Baseline: EVAL: 49  8/20:53.2 Goal status: IN PROGRESS  2.  Pt will increase by at least 0.23 m/s in order to demonstrate clinically significant improvement in community ambulation.   Baseline: EVAL= 16.23 sec with cane or 0.62 m/s  8/20: .74 m/s Goal status: INITIAL  3.  Pt will decrease TUG to below 17 seconds/decrease in order to demonstrate decreased fall risk. Baseline: EVAL: 25.52 sec 8/20:22.95 sec  Goal status: IN PROGRESS  4.  Pt will decrease 5TSTS by at least 20 seconds in order to demonstrate clinically significant improvement in LE strength.  Baseline: EVAL: 53.48 sec with B UE support 06/22/23: 31.27 sec  Goal status: IN PROGRESS  5.  Pt will increase by at least 61m (168ft) in order to demonstrate clinically significant improvement in cardiopulmonary endurance and community ambulation  Baseline: EVAL: 730 feet  8/20: 845 ft no AD  Goal status: IN PROGRESS  6. Pt will demonstrate improved left knee AROM knee flex > 100 deg for improved functional mobility including transfers and walking.   Baseline: EVAL: 88 deg  8/20:90 degrees   Goal status: In Progress  7. Pt will improve FGA by at least 3 points in order to demonstrate clinically significant improvement in balance and decreased risk for falls.  Baseline: 04/29/2023=17/30  06/22/23: 17 Goal status:  Ongoing   8. Pt will improve BERG by at least 3 points in order to demonstrate clinically significant improvement in balance.  Baseline: 04/29/2023= 35/56  06/22/23: :44  Goal Status: Ongoing   ASSESSMENT:  CLINICAL IMPRESSION:  Patient presents with good motivation for completion of physical therapy activities.  Patient challenged with several exercises and manual therapies to improve left knee flexion range of motion.   Patient instructed to continue to work on knee flexion range of motion at home and particularly on days after therapy where her knee range of motion may be better than typical days.  Patient did well with obstacle navigation stepping over several obstacles without freezing of gait or loss of balance and with good foot clearance. Pt will continue to benefit from skilled physical therapy intervention to address impairments, improve QOL, and attain therapy goals.    OBJECTIVE IMPAIRMENTS: Abnormal gait, cardiopulmonary status limiting activity, decreased activity tolerance, decreased balance, decreased coordination, decreased endurance, decreased mobility, difficulty walking, decreased ROM, decreased strength, hypomobility, impaired  flexibility, and pain.   ACTIVITY LIMITATIONS: carrying, lifting, bending, sitting, standing, squatting, sleeping, stairs, and transfers  PARTICIPATION LIMITATIONS: cleaning, laundry, shopping, community activity, and yard work  PERSONAL FACTORS: Age and 1-2 comorbidities: Arthritis, Breast cancer, TKA  are also affecting patient's functional outcome.   REHAB POTENTIAL: Good  CLINICAL DECISION MAKING: Stable/uncomplicated  EVALUATION COMPLEXITY: Low  PLAN:  PT FREQUENCY: 1-2x/week  PT DURATION: 12 weeks  PLANNED INTERVENTIONS: Therapeutic exercises, Therapeutic activity, Neuromuscular re-education, Balance training, Gait training, Patient/Family education, Self Care, Joint mobilization, Stair training, Vestibular training, Canalith  repositioning, DME instructions, Dry Needling, Electrical stimulation, Spinal manipulation, Spinal mobilization, Cryotherapy, Moist heat, Manual therapy, and Re-evaluation  PLAN FOR NEXT SESSION:Therex For Bilateral Knee ROM/Strength; balance and gait training.  Work on transfers to empower more use of Left LE. Incorporation of large amplitude movements when appropriate.   Norman Herrlich, PT 06/29/2023, 2:44 PM

## 2023-07-01 ENCOUNTER — Ambulatory Visit: Payer: Medicare Other | Admitting: Physical Therapy

## 2023-07-01 NOTE — Therapy (Deleted)
OUTPATIENT PHYSICAL THERAPY NEURO TREATMENT    Patient Name: Dorothy Mccoy MRN: [id number] DOB:03-25-1951, 72 y.o., female Today's Date: 07/01/2023   PCP: Dr. Manson Allan REFERRING PROVIDER: Dr. Cristopher Peru  END OF SESSION:           Past Medical History:  Diagnosis Date   Breast cancer [facility])    bilateral   Chronic constipation    GERD (gastroesophageal reflux disease)    Osteoarthritis    Parkinson disease    Personal history of chemotherapy    Personal history of radiation therapy    PONV (postoperative nausea and vomiting)    Psoriatic arthritis (HCC)    Pulmonary embolism (HCC) 2000   following mastectomy   Past Surgical History:  Procedure Laterality Date   BREAST LUMPECTOMY Right 2005   CATARACT EXTRACTION Bilateral    COLONOSCOPY WITH PROPOFOL N/A 04/10/2022   Procedure: COLONOSCOPY WITH PROPOFOL;  Surgeon: Regis Bill, MD;  Location: [facility] ENDOSCOPY;  Service: Endoscopy;  Laterality: N/A;   INGUINAL HERNIA REPAIR Right 2018   MASTECTOMY Left 2000   MYOMECTOMY     TOTAL KNEE ARTHROPLASTY Left    Patient Active Problem List   Diagnosis Date Noted   Pain and swelling of left lower leg 01/25/2023   Arthralgia of multiple sites 01/26/2022   Asthenia 01/26/2022   Gastroesophageal reflux disease 01/26/2022   History of mastectomy 01/26/2022   History of myomectomy 01/26/2022   Malignant tumor of breast (HCC) 01/26/2022   Postoperative retention of urine 01/26/2022   History of pulmonary embolus (PE) 01/26/2022   Constipation 01/08/2022   History of colonic polyps 01/08/2022   History of total knee arthroplasty 06/19/2021   History of breast cancer in female 06/02/2021   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   REFERRING DIAG: Parkinson's disease  THERAPY DIAG:  No diagnosis found.  Rationale for Evaluation and Treatment: Rehabilitation  SUBJECTIVE:                                                                                                                                                                                             SUBJECTIVE STATEMENT:  Pt reports doing well today. Pt  denies any recent falls/stumbles since prior session. Pt denies any updates to medications or medical appointment since prior session. Pt reports good compliance with HEP when time permits.    Pt accompanied by: self  PERTINENT HISTORY: Patient is a 72 year old female with referral for Parkinsons disease. She was diagnosed in 2015 and per [name], PA report from visit on 01/20/2023: She reports her balance has been poor as of late. This is why she has started using a walker on a regular basis. She feels she cannot turn around fast and needs to hold on to something when she does. Last fall was around Christmastime. She states since then she has been especially cautious with her gait. She has also noticed some new dyskinesias over the past six-eight months. She does not feel it is worse at a specific time of day or with relation to medication timing. Her tremor is worst in her right foot, especially when she relaxes at night. She does feel like she is moving slowly overall. She notes all of her symptoms are worse with anxiety and feeling rushed. Because of her balance and dyskinesias, she feels like she is worse overall than at her last appointment.  She has not noticed when her medication wears off. She does not typically notice when her medication kicks in, either.  She notes  she is recovering from knee replacement surgery, and this is making her left leg stiff. Otherwise, she does not feel any stiffness. She also has psoriatic arthritis.  She reports feelings of depression and anxiety. She notes she lost her mother in 2022, just before she moved here. She was very close with her. She notes not being able to get out and about has also contributed.  She last had PT for her knee replacement, never for PD.    PAIN:  Yes, 7/10 pain in the Right knee   PRECAUTIONS: Fall  WEIGHT BEARING RESTRICTIONS: No  FALLS: Has patient fallen in last 6 months?  Last fall was Dec 2023 - while trying to decorate her Christmas tree.   LIVING ENVIRONMENT: Lives with: lives alone Lives in: House/apartment Stairs:  2 level home- basement (does not currently use) steps from garage- 3 with rail on right Has following equipment at home: Single point cane, Walker - 2 wheeled, and [employer] - 4 wheeled  PLOF: Independent  PATIENT GOALS: Improve my balance and loosen up my left knee  OBJECTIVE:   DIAGNOSTIC FINDINGS: EXAM: LEFT KNEE - COMPLETE 4+ VIEW   COMPARISON:  None Available.   FINDINGS: Total knee prosthesis observed, with proximally 1.5 mm lucency along the lateral tibial tray component on the frontal projection (frontal zones 3 and 4). Equivocal/minimal lucency along the methacrylate-bone interface along the femoral component in zone 5. The appearance suggests mild findings of loosening.   Small knee effusion noted.   IMPRESSION: 1. Mild lucency along the lateral tibial tray component (zones 3 and 4) and along the Methacrylate-bone interface along the femoral component (lateral zone 5), suggesting mild loosening. 2. Small knee effusion.     Electronically Signed   By: Gaylyn Rong M.D.   On: 12/31/2022 17:16    COGNITION: Overall cognitive status: Within functional limits for tasks assessed   SENSATION: WFL  COORDINATION: Slow initiation of  movement  EDEMA:  None present    POSTURE: rounded shoulders and forward head  LOWER EXTREMITY ROM:     Active  Right Eval Left Eval  Hip flexion    Hip  extension    Hip abduction    Hip adduction    Hip internal rotation    Hip external rotation    Knee flexion 115 88  Knee extension    Ankle dorsiflexion    Ankle plantarflexion    Ankle inversion    Ankle eversion     (Blank rows = not tested)  LOWER EXTREMITY MMT:    MMT Right Eval Left Eval  Hip flexion 4 4  Hip extension 4 4  Hip abduction 4 4  Hip adduction 4 4  Hip internal rotation 4 4  Hip external rotation 4 4  Knee flexion 4 4  Knee extension 4 2-  Ankle dorsiflexion 4 4  Ankle plantarflexion    Ankle inversion    Ankle eversion    (Blank rows = not tested)  TRANSFERS: Assistive device utilized: Single point cane  Sit to stand: CGA Stand to sit: CGA Chair to chair: CGA Floor:  Not assessed   GAIT: Gait pattern: step through pattern, decreased arm swing- Right, decreased arm swing- Left, decreased step length- Right, and decreased step length- Left Distance walked: 730 Assistive device utilized: Single point cane Level of assistance: CGA Comments: decreased overall step length- no observed shuffling  FUNCTIONAL TESTS:  5 times sit to stand: 53.48 sec with UE support * Left knee pain Timed up and go (TUG): 25.52 with cane 6 minute walk test: 730 feet with cane 10 meter walk test: 0.62 m/s  PATIENT SURVEYS:  FOTO 49 with goal of 59  TODAY'S TREATMENT:                                                                                                                               TE Nustep, challenging knee flexion -started seat levl 10 at end range L knee flexion then progressed to level 9 at 3 min to increase knee flexion angle at end range of nustep movement  -Pt reports L knee flexion felt okay but right knee continues to be bigger source of pain.   Seated HS curl with RTB cues for  slow eccentric control. X 10 on elevated plinth to allow greater ROM.   STS 2 x 5 reps, slow eccentric control to work on functional knee flexion with gravity assist. Cues for large forward weight shift helped significantly   Manual Patellar mobs ( superior grade 3) in multiple ranges of motion  Tib-fem mobs Grade 3/4 x many repetitions  Contract relax (agonist) x 5 x 5 seconds for improved knee flexion with 60 sec hold at end range on last rep  Knee PROM with end range holds, able to achieve 95 degrees knee flexion in supine  95 degrees knee flexion following ( 5 degrees more than last session wihtout treatment)   NMR:   Ambulation with obstacles course stepping over obstacles of various heights ( 1/2 foam rollers, canes, hurdles) x 4 extensions     Note: Portions of  this document were prepared using Dragon voice recognition software and although reviewed may contain unintentional dictation errors in syntax, grammar, or spelling.    PATIENT EDUCATION: Education details: Goals, Functional outcome measures, Purpose of PT Person educated: Patient Education method: Explanation Education comprehension: verbalized understanding  HOME EXERCISE PROGRAM: Access Code: [email or web address] URL: [email or web address] Date: 04/23/2023 Prepared by: Maureen Ralphs  Exercises - Seated Knee Flexion Stretch  - 2 x daily - 3-4 sets - 20-30 sec hold - Seated Hamstring Stretch  - 2 x daily - 3-4 sets - 20-30 hold    -PWR! Seated exercises handout provided and reviewed each exercise GOALS: Goals reviewed with patient? Yes  SHORT TERM GOALS: Target date: 06/03/2023  Pt will be independent with HEP in order to improve strength and balance in order to decrease fall risk and improve function at home and work.  Baseline: EVAL: No formal HEP in place Goal status: MET   LONG TERM GOALS: Target date: 07/15/2023  Pt will improve FOTO to target score of 59 to display perceived improvements in  ability to complete ADL's.  Baseline: EVAL: 49  8/20:53.2 Goal status: IN PROGRESS  2.  Pt will increase by at least 0.23 m/s in order to demonstrate clinically significant improvement in community ambulation.   Baseline: EVAL= 16.23 sec with cane or 0.62 m/s  8/20: .74 m/s Goal status: INITIAL  3.  Pt will decrease TUG to below 17 seconds/decrease in order to demonstrate decreased fall risk. Baseline: EVAL: 25.52 sec 8/20:22.95 sec  Goal status: IN PROGRESS  4.  Pt will decrease 5TSTS by at least 20 seconds in order to demonstrate clinically significant improvement in LE strength.  Baseline: EVAL: 53.48 sec with B UE support 06/22/23: 31.27 sec  Goal status: IN PROGRESS  5.  Pt will increase by at least 16m (119ft) in order to demonstrate clinically significant improvement in cardiopulmonary endurance and community ambulation  Baseline: EVAL: 730 feet  8/20: 845 ft no AD  Goal status: IN PROGRESS  6. Pt will demonstrate improved left knee AROM knee flex > 100 deg for improved functional mobility including transfers and walking.   Baseline: EVAL: 88 deg  8/20:90 degrees   Goal status: In Progress  7. Pt will improve FGA by at least 3 points in order to demonstrate clinically significant improvement in balance and decreased risk for falls.  Baseline: 04/29/2023=17/30  06/22/23: 17 Goal status: Ongoing   8. Pt will improve BERG by at least 3 points in order to demonstrate clinically significant improvement in balance.  Baseline: 04/29/2023= 35/56  06/22/23: :44  Goal Status: Ongoing   ASSESSMENT:  CLINICAL IMPRESSION:  Patient presents with good motivation for completion of physical therapy activities.  Patient challenged with several exercises and manual therapies to improve left knee flexion range of motion.   Patient instructed to continue to work on knee flexion range of motion at home and particularly on days after therapy where her knee range of motion may be  better than typical days.  Patient did well with obstacle navigation stepping over several obstacles without freezing of gait or loss of balance and with good foot clearance. Pt will continue to benefit from skilled physical therapy intervention to address impairments, improve QOL, and attain therapy goals.    OBJECTIVE IMPAIRMENTS: Abnormal gait, cardiopulmonary status limiting activity, decreased activity tolerance, decreased balance, decreased coordination, decreased endurance, decreased mobility, difficulty walking, decreased ROM, decreased strength, hypomobility, impaired flexibility, and pain.   ACTIVITY LIMITATIONS: carrying, lifting,  bending, sitting, standing, squatting, sleeping, stairs, and transfers  PARTICIPATION LIMITATIONS: cleaning, laundry, shopping, community activity, and yard work  PERSONAL FACTORS: Age and 1-2 comorbidities: Arthritis, Breast cancer, TKA  are also affecting patient's functional outcome.   REHAB POTENTIAL: Good  CLINICAL DECISION MAKING: Stable/uncomplicated  EVALUATION COMPLEXITY: Low  PLAN:  PT FREQUENCY: 1-2x/week  PT DURATION: 12 weeks  PLANNED INTERVENTIONS: Therapeutic exercises, Therapeutic activity, Neuromuscular re-education, Balance training, Gait training, Patient/Family education, Self Care, Joint mobilization, Stair training, Vestibular training, Canalith repositioning, DME instructions, Dry Needling, Electrical stimulation, Spinal manipulation, Spinal mobilization, Cryotherapy, Moist heat, Manual therapy, and Re-evaluation  PLAN FOR NEXT SESSION:Therex For Bilateral Knee ROM/Strength; balance and gait training.  Work on transfers to empower more use of Left LE. Incorporation of large amplitude movements when appropriate.   Norman Herrlich, PT 07/01/2023, 9:06 AM

## 2023-07-06 ENCOUNTER — Ambulatory Visit: Payer: Medicare Other | Attending: Neurology | Admitting: Physical Therapy

## 2023-07-06 DIAGNOSIS — R269 Unspecified abnormalities of gait and mobility: Secondary | ICD-10-CM

## 2023-07-06 DIAGNOSIS — R2681 Unsteadiness on feet: Secondary | ICD-10-CM | POA: Diagnosis present

## 2023-07-06 DIAGNOSIS — R262 Difficulty in walking, not elsewhere classified: Secondary | ICD-10-CM

## 2023-07-06 DIAGNOSIS — M6281 Muscle weakness (generalized): Secondary | ICD-10-CM | POA: Diagnosis present

## 2023-07-06 DIAGNOSIS — R2689 Other abnormalities of gait and mobility: Secondary | ICD-10-CM

## 2023-07-06 NOTE — Therapy (Signed)
OUTPATIENT PHYSICAL THERAPY NEURO TREATMENT    Patient Name: Dorothy Mccoy MRN: [id number] DOB:1951/02/13, 72 y.o., female Today's Date: 07/06/2023   PCP: Dr. Manson Allan REFERRING PROVIDER: Dr. Cristopher Peru  END OF SESSION:  PT End of Session - 07/06/23 1516     Visit Number 13    Number of Visits 24    Date for PT Re-Evaluation 07/15/23    Progress Note Due on Visit 20    PT Start Time 1445    PT Stop Time 1528    PT Time Calculation (min) 43 min    Equipment Utilized During Treatment Gait belt    Activity Tolerance Patient tolerated treatment well    Behavior During Therapy WFL for tasks assessed/performed                     Past Medical History:  Diagnosis Date   Breast cancer (HCC)    bilateral   Chronic constipation    GERD (gastroesophageal reflux disease)    Osteoarthritis    Parkinson disease    Personal history of chemotherapy    Personal history of radiation therapy    PONV (postoperative nausea and vomiting)    Psoriatic arthritis (HCC)    Pulmonary embolism (HCC) 2000   following mastectomy   Past Surgical History:  Procedure Laterality Date   BREAST LUMPECTOMY Right 2005   CATARACT EXTRACTION Bilateral    COLONOSCOPY WITH PROPOFOL N/A 04/10/2022   Procedure: COLONOSCOPY WITH PROPOFOL;  Surgeon: Regis Bill, MD;  Location: [facility] ENDOSCOPY;  Service: Endoscopy;  Laterality: N/A;   INGUINAL HERNIA REPAIR Right 2018   MASTECTOMY Left 2000   MYOMECTOMY     TOTAL KNEE ARTHROPLASTY Left    Patient Active Problem List   Diagnosis Date Noted   Pain and swelling of left lower leg 01/25/2023   Arthralgia of multiple sites 01/26/2022   Asthenia 01/26/2022   Gastroesophageal reflux disease 01/26/2022   History of mastectomy 01/26/2022   History of myomectomy 01/26/2022   Malignant tumor of breast (HCC) 01/26/2022   Postoperative retention of urine 01/26/2022   History of pulmonary embolus (PE) 01/26/2022   Constipation 01/08/2022    History of colonic polyps 01/08/2022   History of total knee arthroplasty 06/19/2021   History of breast cancer in female 06/02/2021   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   REFERRING DIAG: Parkinson's disease  THERAPY DIAG:  No diagnosis found.  Rationale for Evaluation and Treatment: Rehabilitation  SUBJECTIVE:  SUBJECTIVE STATEMENT:  Pt reports doing well today. Pt denies any recent falls/stumbles since prior session. Pt denies any updates to medications or medical appointment since prior session. Pt reports good compliance with HEP when time permits.    Pt accompanied by: self  PERTINENT HISTORY: Patient is a 72 year old female with referral for Parkinsons disease. She was diagnosed in 2015 and per [name], PA report from visit on 01/20/2023: She reports her balance has been poor as of late. This is why she has started using a walker on a regular basis. She feels she cannot turn around fast and needs to hold on to something when she does. Last fall was around Christmastime. She states since then she has been especially cautious with her gait. She has also noticed some new dyskinesias over the past six-eight months. She does not feel it is worse at a specific time of day or with relation to medication timing. Her tremor is worst in  her right foot, especially when she relaxes at night. She does feel like she is moving slowly overall. She notes all of her symptoms are worse with anxiety and feeling rushed. Because of her balance and dyskinesias, she feels like she is worse overall than at her last appointment.  She has not noticed when her medication wears off. She does not typically notice when her medication kicks in, either.  She notes she is recovering from knee replacement surgery, and this is making her left leg stiff. Otherwise, she does not feel any stiffness. She also has psoriatic arthritis.  She reports feelings of depression and anxiety. She notes she lost her mother in 2022, just before she moved here. She was very close with her. She notes not being able to get out and about has also contributed.  She last had PT for her knee replacement, never for PD.    PAIN:  Yes, 7/10 pain in the Right knee   PRECAUTIONS: Fall  WEIGHT BEARING RESTRICTIONS: No  FALLS: Has patient fallen in last 6 months?  Last fall was Dec 2023 - while trying to decorate her Christmas tree.   LIVING ENVIRONMENT: Lives with: lives alone Lives in: House/apartment Stairs:  2 level home- basement (does not currently use) steps from garage- 3 with rail on right Has following equipment at home: Single point cane, Walker - 2 wheeled, and [employer] - 4 wheeled  PLOF: Independent  PATIENT GOALS: Improve my balance and loosen up my left knee  OBJECTIVE:   DIAGNOSTIC FINDINGS: EXAM: LEFT KNEE - COMPLETE 4+ VIEW   COMPARISON:  None Available.   FINDINGS: Total knee prosthesis observed, with proximally 1.5 mm lucency along the lateral tibial tray component on the frontal projection (frontal zones 3 and 4). Equivocal/minimal lucency along the methacrylate-bone interface along the femoral component in zone 5. The appearance suggests mild findings of loosening.   Small knee effusion noted.   IMPRESSION: 1. Mild lucency along the  lateral tibial tray component (zones 3 and 4) and along the Methacrylate-bone interface along the femoral component (lateral zone 5), suggesting mild loosening. 2. Small knee effusion.     Electronically Signed   By: Gaylyn Rong M.D.   On: 12/31/2022 17:16    COGNITION: Overall cognitive status: Within functional limits for tasks assessed   SENSATION: WFL  COORDINATION: Slow initiation of movement  EDEMA:  None present    POSTURE: rounded shoulders and forward head  LOWER EXTREMITY ROM:     Active  Right Eval  Left Eval  Hip flexion    Hip extension    Hip abduction    Hip adduction    Hip internal rotation    Hip external rotation    Knee flexion 115 88  Knee extension    Ankle dorsiflexion    Ankle plantarflexion    Ankle inversion    Ankle eversion     (Blank rows = not tested)  LOWER EXTREMITY MMT:    MMT Right Eval Left Eval  Hip flexion 4 4  Hip extension 4 4  Hip abduction 4 4  Hip adduction 4 4  Hip internal rotation 4 4  Hip external rotation 4 4  Knee flexion 4 4  Knee extension 4 2-  Ankle dorsiflexion 4 4  Ankle plantarflexion    Ankle inversion    Ankle eversion    (Blank rows = not tested)  TRANSFERS: Assistive device utilized: Single point cane  Sit to stand: CGA Stand to sit: CGA Chair to chair: CGA Floor:  Not assessed   GAIT: Gait pattern: step through pattern, decreased arm swing- Right, decreased arm swing- Left, decreased step length- Right, and decreased step length- Left Distance walked: 730 Assistive device utilized: Single point cane Level of assistance: CGA Comments: decreased overall step length- no observed shuffling  FUNCTIONAL TESTS:  5 times sit to stand: 53.48 sec with UE support * Left knee pain Timed up and go (TUG): 25.52 with cane 6 minute walk test: 730 feet with cane 10 meter walk test: 0.62 m/s  PATIENT SURVEYS:  FOTO 49 with goal of 59  TODAY'S TREATMENT:                                                                                                                                TE Nustep, challenging knee flexion -started seat levl 10 at end range L knee flexion then progressed to level 9 at 3 min to increase knee flexion angle at end range of nustep movement  Took R LE off of nustep at minute 3 due to increased pain but still worked with UE and LLE working into increased knee flexion   Manual Patellar mobs ( superior grade 3) in multiple ranges of motion  Tib-fem mobs Grade 3/4 x many repetitions  Contract relax (agonist) x 5 x 5 seconds for improved knee flexion with 60 sec hold at end range on last rep  Knee PROM with end range holds, able to achieve 95 degrees knee flexion in supine  95 degrees knee flexion following ( 5 degrees more than last session wihtout treatment With prolonged flexion stretch noted HS muscle spasms and used IASTM to L HS musculature x 5 minutes for relief of spasms.   TE Seated HS curl with RTB cues for slow eccentric control. 2 X 10 on elevated plinth to allow greater ROM.   STS  x 5 reps, slow eccentric control to work on functional knee flexion with  gravity assist. Cues for large forward weight shift helped significantly   Knee flexion stretch at steps 3 x 45 seconds on LLE working into increased knee flexion range to pt tolerance.      Note: Portions of this document were prepared using Dragon voice recognition software and although reviewed may contain unintentional dictation errors in syntax, grammar, or spelling.    PATIENT EDUCATION: Education details: Goals, Functional outcome measures, Purpose of PT Person educated: Patient Education method: Explanation Education comprehension: verbalized understanding  HOME EXERCISE PROGRAM: Access Code: [email or web address] URL: [email or web address] Date: 04/23/2023 Prepared by: Maureen Ralphs  Exercises - Seated Knee Flexion Stretch  - 2 x daily - 3-4 sets - 20-30 sec  hold - Seated Hamstring Stretch  - 2 x daily - 3-4 sets - 20-30 hold    -PWR! Seated exercises handout provided and reviewed each exercise GOALS: Goals reviewed with patient? Yes  SHORT TERM GOALS: Target date: 06/03/2023  Pt will be independent with HEP in order to improve strength and balance in order to decrease fall risk and improve function at home and work.  Baseline: EVAL: No formal HEP in place Goal status: MET   LONG TERM GOALS: Target date: 07/15/2023  Pt will improve FOTO to target score of 59 to display perceived improvements in ability to complete ADL's.  Baseline: EVAL: 49  8/20:53.2 Goal status: IN PROGRESS  2.  Pt will increase by at least 0.23 m/s in order to demonstrate clinically significant improvement in community ambulation.   Baseline: EVAL= 16.23 sec with cane or 0.62 m/s  8/20: .74 m/s Goal status: INITIAL  3.  Pt will decrease TUG to below 17 seconds/decrease in order to demonstrate decreased fall risk. Baseline: EVAL: 25.52 sec 8/20:22.95 sec  Goal status: IN PROGRESS  4.  Pt will decrease 5TSTS by at least 20 seconds in order to demonstrate clinically significant improvement in LE strength.  Baseline: EVAL: 53.48 sec with B UE support 06/22/23: 31.27 sec  Goal status: IN PROGRESS  5.  Pt will increase by at least 17m (113ft) in order to demonstrate clinically significant improvement in cardiopulmonary endurance and community ambulation  Baseline: EVAL: 730 feet  8/20: 845 ft no AD  Goal status: IN PROGRESS  6. Pt will demonstrate improved left knee AROM knee flex > 100 deg for improved functional mobility including transfers and walking.   Baseline: EVAL: 88 deg  8/20:90 degrees   Goal status: In Progress  7. Pt will improve FGA by at least 3 points in order to demonstrate clinically significant improvement in balance and decreased risk for falls.  Baseline: 04/29/2023=17/30  06/22/23: 17 Goal status: Ongoing   8. Pt will improve BERG  by at least 3 points in order to demonstrate clinically significant improvement in balance.  Baseline: 04/29/2023= 35/56  06/22/23: :44  Goal Status: Ongoing   ASSESSMENT:  CLINICAL IMPRESSION:  Patient presents with good motivation for completion of physical therapy activities.  Patient challenged with several exercises and manual therapies to improve left knee flexion range of motion.   Patient instructed to continue to work on knee flexion range of motion at home and particularly on days after therapy where her knee range of motion may be better than typical days. Pt R LE continues to be a big source of pain and limitation. This was monitored throughout session as we focussed on L knee range of motion to improve her ability to perform functional activities.   Pt will continue to benefit  from skilled physical therapy intervention to address impairments, improve QOL, and attain therapy goals.    OBJECTIVE IMPAIRMENTS: Abnormal gait, cardiopulmonary status limiting activity, decreased activity tolerance, decreased balance, decreased coordination, decreased endurance, decreased mobility, difficulty walking, decreased ROM, decreased strength, hypomobility, impaired flexibility, and pain.   ACTIVITY LIMITATIONS: carrying, lifting, bending, sitting, standing, squatting, sleeping, stairs, and transfers  PARTICIPATION LIMITATIONS: cleaning, laundry, shopping, community activity, and yard work  PERSONAL FACTORS: Age and 1-2 comorbidities: Arthritis, Breast cancer, TKA  are also affecting patient's functional outcome.   REHAB POTENTIAL: Good  CLINICAL DECISION MAKING: Stable/uncomplicated  EVALUATION COMPLEXITY: Low  PLAN:  PT FREQUENCY: 1-2x/week  PT DURATION: 12 weeks  PLANNED INTERVENTIONS: Therapeutic exercises, Therapeutic activity, Neuromuscular re-education, Balance training, Gait training, Patient/Family education, Self Care, Joint mobilization, Stair training, Vestibular training,  Canalith repositioning, DME instructions, Dry Needling, Electrical stimulation, Spinal manipulation, Spinal mobilization, Cryotherapy, Moist heat, Manual therapy, and Re-evaluation  PLAN FOR NEXT SESSION:Therex For Bilateral Knee ROM/Strength; balance and gait training.  Work on transfers to empower more use of Left LE. Incorporation of large amplitude movements when appropriate.   Norman Herrlich, PT 07/06/2023, 3:17 PM

## 2023-07-08 ENCOUNTER — Ambulatory Visit: Payer: Medicare Other | Admitting: Physical Therapy

## 2023-07-08 ENCOUNTER — Encounter: Payer: Self-pay | Admitting: Physical Therapy

## 2023-07-08 DIAGNOSIS — R2681 Unsteadiness on feet: Secondary | ICD-10-CM

## 2023-07-08 DIAGNOSIS — R262 Difficulty in walking, not elsewhere classified: Secondary | ICD-10-CM | POA: Diagnosis not present

## 2023-07-08 DIAGNOSIS — M6281 Muscle weakness (generalized): Secondary | ICD-10-CM

## 2023-07-08 DIAGNOSIS — R269 Unspecified abnormalities of gait and mobility: Secondary | ICD-10-CM

## 2023-07-08 DIAGNOSIS — R2689 Other abnormalities of gait and mobility: Secondary | ICD-10-CM

## 2023-07-08 NOTE — Therapy (Signed)
OUTPATIENT PHYSICAL THERAPY NEURO TREATMENT    Patient Name: Dorothy Mccoy MRN: [id number] DOB:Jun 04, 1951, 72 y.o., female Today's Date: 07/08/2023   PCP: Dr. Manson Allan REFERRING PROVIDER: Dr. Cristopher Peru  END OF SESSION:  PT End of Session - 07/08/23 1516     Visit Number 14    Number of Visits 24    Date for PT Re-Evaluation 07/15/23    Progress Note Due on Visit 20    PT Start Time 1448    PT Stop Time 1527    PT Time Calculation (min) 39 min    Equipment Utilized During Treatment Gait belt    Activity Tolerance Patient tolerated treatment well    Behavior During Therapy WFL for tasks assessed/performed                     Past Medical History:  Diagnosis Date   Breast cancer (HCC)    bilateral   Chronic constipation    GERD (gastroesophageal reflux disease)    Osteoarthritis    Parkinson disease    Personal history of chemotherapy    Personal history of radiation therapy    PONV (postoperative nausea and vomiting)    Psoriatic arthritis (HCC)    Pulmonary embolism (HCC) 2000   following mastectomy   Past Surgical History:  Procedure Laterality Date   BREAST LUMPECTOMY Right 2005   CATARACT EXTRACTION Bilateral    COLONOSCOPY WITH PROPOFOL N/A 04/10/2022   Procedure: COLONOSCOPY WITH PROPOFOL;  Surgeon: Regis Bill, MD;  Location: [facility] ENDOSCOPY;  Service: Endoscopy;  Laterality: N/A;   INGUINAL HERNIA REPAIR Right 2018   MASTECTOMY Left 2000   MYOMECTOMY     TOTAL KNEE ARTHROPLASTY Left    Patient Active Problem List   Diagnosis Date Noted   Pain and swelling of left lower leg 01/25/2023   Arthralgia of multiple sites 01/26/2022   Asthenia 01/26/2022   Gastroesophageal reflux disease 01/26/2022   History of mastectomy 01/26/2022   History of myomectomy 01/26/2022   Malignant tumor of breast (HCC) 01/26/2022   Postoperative retention of urine 01/26/2022   History of pulmonary embolus (PE) 01/26/2022   Constipation 01/08/2022    History of colonic polyps 01/08/2022   History of total knee arthroplasty 06/19/2021   History of breast cancer in female 06/02/2021   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   REFERRING DIAG: Parkinson's disease  THERAPY DIAG:  Difficulty in walking, not elsewhere classified  Abnormality of gait and mobility  Other abnormalities of gait and mobility  Unsteadiness on feet  Muscle weakness (generalized)  Rationale for Evaluation and Treatment: Rehabilitation  SUBJECTIVE:  SUBJECTIVE STATEMENT:  Pt reports doing well today. Pt denies any recent falls/stumbles since prior session. Pt denies any updates to medications or medical appointment since prior session. Pt reports good compliance with HEP when time permits.    Pt accompanied by: self  PERTINENT HISTORY: Patient is a 72 year old female with referral for Parkinsons disease. She was diagnosed in 2015 and per [name], PA report from visit on 01/20/2023: She reports her balance has been poor as of late. This is why she has started using a walker on a regular basis. She feels she cannot turn around fast and needs to hold on to something when she does. Last fall was around Christmastime. She states since then she has been especially cautious with her gait. She has also noticed some  new dyskinesias over the past six-eight months. She does not feel it is worse at a specific time of day or with relation to medication timing. Her tremor is worst in her right foot, especially when she relaxes at night. She does feel like she is moving slowly overall. She notes all of her symptoms are worse with anxiety and feeling rushed. Because of her balance and dyskinesias, she feels like she is worse overall than at her last appointment.  She has not noticed when her medication wears off. She does not typically notice when her medication kicks in, either.  She notes she is recovering from knee replacement surgery, and this is making her left leg stiff. Otherwise, she does not feel any stiffness. She also has psoriatic arthritis.  She reports feelings of depression and anxiety. She notes she lost her mother in 2022, just before she moved here. She was very close with her. She notes not being able to get out and about has also contributed.  She last had PT for her knee replacement, never for PD.    PAIN:  Yes, 7/10 pain in the Right knee   PRECAUTIONS: Fall  WEIGHT BEARING RESTRICTIONS: No  FALLS: Has patient fallen in last 6 months?  Last fall was Dec 2023 - while trying to decorate her Christmas tree.   LIVING ENVIRONMENT: Lives with: lives alone Lives in: House/apartment Stairs:  2 level home- basement (does not currently use) steps from garage- 3 with rail on right Has following equipment at home: Single point cane, Walker - 2 wheeled, and [employer] - 4 wheeled  PLOF: Independent  PATIENT GOALS: Improve my balance and loosen up my left knee  OBJECTIVE:   DIAGNOSTIC FINDINGS: EXAM: LEFT KNEE - COMPLETE 4+ VIEW   COMPARISON:  None Available.   FINDINGS: Total knee prosthesis observed, with proximally 1.5 mm lucency along the lateral tibial tray component on the frontal projection (frontal zones 3 and 4). Equivocal/minimal lucency along the methacrylate-bone interface  along the femoral component in zone 5. The appearance suggests mild findings of loosening.   Small knee effusion noted.   IMPRESSION: 1. Mild lucency along the lateral tibial tray component (zones 3 and 4) and along the Methacrylate-bone interface along the femoral component (lateral zone 5), suggesting mild loosening. 2. Small knee effusion.     Electronically Signed   By: Gaylyn Rong M.D.   On: 12/31/2022 17:16    COGNITION: Overall cognitive status: Within functional limits for tasks assessed   SENSATION: WFL  COORDINATION: Slow initiation of movement  EDEMA:  None present    POSTURE: rounded shoulders and forward head  LOWER EXTREMITY ROM:     Active  Right Eval  Left Eval  Hip flexion    Hip extension    Hip abduction    Hip adduction    Hip internal rotation    Hip external rotation    Knee flexion 115 88  Knee extension    Ankle dorsiflexion    Ankle plantarflexion    Ankle inversion    Ankle eversion     (Blank rows = not tested)  LOWER EXTREMITY MMT:    MMT Right Eval Left Eval  Hip flexion 4 4  Hip extension 4 4  Hip abduction 4 4  Hip adduction 4 4  Hip internal rotation 4 4  Hip external rotation 4 4  Knee flexion 4 4  Knee extension 4 2-  Ankle dorsiflexion 4 4  Ankle plantarflexion    Ankle inversion    Ankle eversion    (Blank rows = not tested)  TRANSFERS: Assistive device utilized: Single point cane  Sit to stand: CGA Stand to sit: CGA Chair to chair: CGA Floor:  Not assessed   GAIT: Gait pattern: step through pattern, decreased arm swing- Right, decreased arm swing- Left, decreased step length- Right, and decreased step length- Left Distance walked: 730 Assistive device utilized: Single point cane Level of assistance: CGA Comments: decreased overall step length- no observed shuffling  FUNCTIONAL TESTS:  5 times sit to stand: 53.48 sec with UE support * Left knee pain Timed up and go (TUG): 25.52 with  cane 6 minute walk test: 730 feet with cane 10 meter walk test: 0.62 m/s  PATIENT SURVEYS:  FOTO 49 with goal of 59  TODAY'S TREATMENT:                                                                                                                               TE Nustep, challenging knee flexion -started seat level 10 at end range L knee flexion then progressed to level 9 at 3 min to increase knee flexion angle at end range of nustep movement   Took R LE off of nustep due to increased pain but still worked with UE and LLE working into increased knee flexion   Manual  Tib-fem mobs Grade 3/4 x 2-3 min Contract relax (agonist) x 5 x 5 seconds for improved knee flexion with 60 sec hold at end range on last rep  Knee PROM with end range holds, able to achieve 95 degrees knee flexion in supine  Flexion PROM with overpressure form PT x 3 min, knee motion seemed to improve but was unable to measure.  With prolonged flexion stretch noted HS muscle spasms and used IASTM to L HS musculature x 5 minutes for relief of spasms.   TE  Knee flexion stretch at steps 3 x 45 seconds on LLE working into increased knee flexion range to pt tolerance.    Ambulation training no AD walking over obstacles x 5 minutes, pt encouraged to use smooth gait pattern and increase speed as  pt tends to freeze when her gait is slow  Note: Portions of this document were prepared using Dragon voice recognition software and although reviewed may contain unintentional dictation errors in syntax, grammar, or spelling.    PATIENT EDUCATION: Education details: Goals, Functional outcome measures, Purpose of PT Person educated: Patient Education method: Explanation Education comprehension: verbalized understanding  HOME EXERCISE PROGRAM: Access Code: [email or web address] URL: [email or web address] Date: 04/23/2023 Prepared by: Maureen Ralphs  Exercises - Seated Knee Flexion Stretch  - 2 x daily - 3-4 sets -  20-30 sec hold - Seated Hamstring Stretch  - 2 x daily - 3-4 sets - 20-30 hold    -PWR! Seated exercises handout provided and reviewed each exercise GOALS: Goals reviewed with patient? Yes  SHORT TERM GOALS: Target date: 06/03/2023  Pt will be independent with HEP in order to improve strength and balance in order to decrease fall risk and improve function at home and work.  Baseline: EVAL: No formal HEP in place Goal status: MET   LONG TERM GOALS: Target date: 07/15/2023  Pt will improve FOTO to target score of 59 to display perceived improvements in ability to complete ADL's.  Baseline: EVAL: 49  8/20:53.2 Goal status: IN PROGRESS  2.  Pt will increase by at least 0.23 m/s in order to demonstrate clinically significant improvement in community ambulation.   Baseline: EVAL= 16.23 sec with cane or 0.62 m/s  8/20: .74 m/s Goal status: INITIAL  3.  Pt will decrease TUG to below 17 seconds/decrease in order to demonstrate decreased fall risk. Baseline: EVAL: 25.52 sec 8/20:22.95 sec  Goal status: IN PROGRESS  4.  Pt will decrease 5TSTS by at least 20 seconds in order to demonstrate clinically significant improvement in LE strength.  Baseline: EVAL: 53.48 sec with B UE support 06/22/23: 31.27 sec  Goal status: IN PROGRESS  5.  Pt will increase by at least 2m (130ft) in order to demonstrate clinically significant improvement in cardiopulmonary endurance and community ambulation  Baseline: EVAL: 730 feet  8/20: 845 ft no AD  Goal status: IN PROGRESS  6. Pt will demonstrate improved left knee AROM knee flex > 100 deg for improved functional mobility including transfers and walking.   Baseline: EVAL: 88 deg  8/20:90 degrees   Goal status: In Progress  7. Pt will improve FGA by at least 3 points in order to demonstrate clinically significant improvement in balance and decreased risk for falls.  Baseline: 04/29/2023=17/30  06/22/23: 17 Goal status: Ongoing   8. Pt will  improve BERG by at least 3 points in order to demonstrate clinically significant improvement in balance.  Baseline: 04/29/2023= 35/56  06/22/23: :44  Goal Status: Ongoing   ASSESSMENT:  CLINICAL IMPRESSION:  Patient presents with good motivation for completion of physical therapy activities.  Patient challenged with several exercises and manual therapies to improve left knee flexion range of motion.   Patient instructed to continue to work on knee flexion range of motion at home and particularly on days after therapy where her knee range of motion may be better than typical days. Pt R LE continues to be a big source of pain and limitation. This was monitored throughout session as we focussed on L knee range of motion to improve her ability to perform functional activities.   Pt will continue to benefit from skilled physical therapy intervention to address impairments, improve QOL, and attain therapy goals.    OBJECTIVE IMPAIRMENTS: Abnormal gait, cardiopulmonary status limiting activity, decreased activity  tolerance, decreased balance, decreased coordination, decreased endurance, decreased mobility, difficulty walking, decreased ROM, decreased strength, hypomobility, impaired flexibility, and pain.   ACTIVITY LIMITATIONS: carrying, lifting, bending, sitting, standing, squatting, sleeping, stairs, and transfers  PARTICIPATION LIMITATIONS: cleaning, laundry, shopping, community activity, and yard work  PERSONAL FACTORS: Age and 1-2 comorbidities: Arthritis, Breast cancer, TKA  are also affecting patient's functional outcome.   REHAB POTENTIAL: Good  CLINICAL DECISION MAKING: Stable/uncomplicated  EVALUATION COMPLEXITY: Low  PLAN:  PT FREQUENCY: 1-2x/week  PT DURATION: 12 weeks  PLANNED INTERVENTIONS: Therapeutic exercises, Therapeutic activity, Neuromuscular re-education, Balance training, Gait training, Patient/Family education, Self Care, Joint mobilization, Stair training, Vestibular  training, Canalith repositioning, DME instructions, Dry Needling, Electrical stimulation, Spinal manipulation, Spinal mobilization, Cryotherapy, Moist heat, Manual therapy, and Re-evaluation  PLAN FOR NEXT SESSION:Therex For Bilateral Knee ROM/Strength; balance and gait training.  Work on transfers to empower more use of Left LE. Incorporation of large amplitude movements when appropriate.   Norman Herrlich, PT 07/08/2023, 5:08 PM

## 2023-07-13 ENCOUNTER — Ambulatory Visit: Payer: Medicare Other | Admitting: Physical Therapy

## 2023-07-13 NOTE — Therapy (Deleted)
OUTPATIENT PHYSICAL THERAPY NEURO TREATMENT    Patient Name: Dorothy Mccoy MRN: [id number] DOB:1951-10-22, 72 y.o., female Today's Date: 07/13/2023   PCP: Dr. Manson Allan REFERRING PROVIDER: Dr. Cristopher Peru  END OF SESSION:            Past Medical History:  Diagnosis Date   Breast cancer [facility])    bilateral   Chronic constipation    GERD (gastroesophageal reflux disease)    Osteoarthritis    Parkinson disease    Personal history of chemotherapy    Personal history of radiation therapy    PONV (postoperative nausea and vomiting)    Psoriatic arthritis (HCC)    Pulmonary embolism (HCC) 2000   following mastectomy   Past Surgical History:  Procedure Laterality Date   BREAST LUMPECTOMY Right 2005   CATARACT EXTRACTION Bilateral    COLONOSCOPY WITH PROPOFOL N/A 04/10/2022   Procedure: COLONOSCOPY WITH PROPOFOL;  Surgeon: Regis Bill, MD;  Location: [facility] ENDOSCOPY;  Service: Endoscopy;  Laterality: N/A;   INGUINAL HERNIA REPAIR Right 2018   MASTECTOMY Left 2000   MYOMECTOMY     TOTAL KNEE ARTHROPLASTY Left    Patient Active Problem List   Diagnosis Date Noted   Pain and swelling of left lower leg 01/25/2023   Arthralgia of multiple sites 01/26/2022   Asthenia 01/26/2022   Gastroesophageal reflux disease 01/26/2022   History of mastectomy 01/26/2022   History of myomectomy 01/26/2022   Malignant tumor of breast (HCC) 01/26/2022   Postoperative retention of urine 01/26/2022   History of pulmonary embolus (PE) 01/26/2022   Constipation 01/08/2022   History of colonic polyps 01/08/2022   History of total knee arthroplasty 06/19/2021   History of breast cancer in female 06/02/2021   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   REFERRING DIAG: Parkinson's disease  THERAPY DIAG:  No diagnosis found.  Rationale for Evaluation and Treatment: Rehabilitation  SUBJECTIVE:                                                                                                                                                                                             SUBJECTIVE STATEMENT:  Pt reports doing well today.  Pt denies any recent falls/stumbles since prior session. Pt denies any updates to medications or medical appointment since prior session. Pt reports good compliance with HEP when time permits.    Pt accompanied by: self  PERTINENT HISTORY: Patient is a 72 year old female with referral for Parkinsons disease. She was diagnosed in 2015 and per Dorothy Shears, PA report from visit on 01/20/2023: She reports her balance has been poor as of late. This is why she has started using a walker on a regular basis. She feels she cannot turn around fast and needs to hold on to something when she does. Last fall was around Christmastime. She states since then she has been especially cautious with her gait. She has also noticed some new dyskinesias over the past six-eight months. She does not feel it is worse at a specific time of day or with relation to medication timing. Her tremor is worst in her right foot, especially when she relaxes at night. She does feel like she is moving slowly overall. She notes all of her symptoms are worse with anxiety and feeling rushed. Because of her balance and dyskinesias, she feels like she is worse overall than at her last appointment.  She has not noticed when her medication wears off. She does not typically notice when her medication kicks in,  either.  She notes she is recovering from knee replacement surgery, and this is making her left leg stiff. Otherwise, she does not feel any stiffness. She also has psoriatic arthritis.  She reports feelings of depression and anxiety. She notes she lost her mother in 2022, just before she moved here. She was very close with her. She notes not being able to get out and about has also contributed.  She last had PT for her knee replacement, never for PD.    PAIN:  Yes, 7/10 pain in the Right knee   PRECAUTIONS: Fall  WEIGHT BEARING RESTRICTIONS: No  FALLS: Has patient fallen in last 6 months?  Last fall was Dec 2023 - while trying to decorate her Christmas tree.   LIVING ENVIRONMENT: Lives with: lives alone Lives in: House/apartment Stairs:  2 level home- basement (does not currently use) steps from garage- 3 with rail on right Has following equipment at home: Single point cane, Walker - 2 wheeled, and [employer] - 4 wheeled  PLOF: Independent  PATIENT GOALS: Improve my balance and loosen up my left knee  OBJECTIVE:   DIAGNOSTIC FINDINGS: EXAM: LEFT KNEE - COMPLETE 4+ VIEW   COMPARISON:  None Available.   FINDINGS: Total knee prosthesis observed, with proximally 1.5 mm lucency along the lateral tibial tray component on the frontal projection (frontal zones 3 and 4). Equivocal/minimal lucency along the methacrylate-bone interface along the femoral component in zone 5. The appearance suggests mild findings of loosening.   Small knee effusion noted.   IMPRESSION: 1. Mild lucency along the lateral tibial tray component (zones 3 and 4) and along the Methacrylate-bone interface along the femoral component (lateral zone 5), suggesting mild loosening. 2. Small knee effusion.     Electronically Signed   By: Gaylyn Rong M.D.   On: 12/31/2022 17:16    COGNITION: Overall cognitive status: Within functional limits for tasks  assessed   SENSATION: WFL  COORDINATION: Slow initiation of movement  EDEMA:  None present    POSTURE: rounded shoulders and forward head  LOWER EXTREMITY ROM:     Active  Right Eval Left Eval  Hip flexion  Hip extension    Hip abduction    Hip adduction    Hip internal rotation    Hip external rotation    Knee flexion 115 88  Knee extension    Ankle dorsiflexion    Ankle plantarflexion    Ankle inversion    Ankle eversion     (Blank rows = not tested)  LOWER EXTREMITY MMT:    MMT Right Eval Left Eval  Hip flexion 4 4  Hip extension 4 4  Hip abduction 4 4  Hip adduction 4 4  Hip internal rotation 4 4  Hip external rotation 4 4  Knee flexion 4 4  Knee extension 4 2-  Ankle dorsiflexion 4 4  Ankle plantarflexion    Ankle inversion    Ankle eversion    (Blank rows = not tested)  TRANSFERS: Assistive device utilized: Single point cane  Sit to stand: CGA Stand to sit: CGA Chair to chair: CGA Floor:  Not assessed   GAIT: Gait pattern: step through pattern, decreased arm swing- Right, decreased arm swing- Left, decreased step length- Right, and decreased step length- Left Distance walked: 730 Assistive device utilized: Single point cane Level of assistance: CGA Comments: decreased overall step length- no observed shuffling  FUNCTIONAL TESTS:  5 times sit to stand: 53.48 sec with UE support * Left knee pain Timed up and go (TUG): 25.52 with cane 6 minute walk test: 730 feet with cane 10 meter walk test: 0.62 m/s  PATIENT SURVEYS:  FOTO 49 with goal of 59  TODAY'S TREATMENT:                                                                                                                               TE Nustep, challenging knee flexion -started seat level 10 at end range L knee flexion then progressed to level 9 at 3 min to increase knee flexion angle at end range of nustep movement   Took R LE off of nustep due to increased pain but still  worked with UE and LLE working into increased knee flexion   Manual  Tib-fem mobs Grade 3/4 x 2-3 min Contract relax (agonist) x 5 x 5 seconds for improved knee flexion with 60 sec hold at end range on last rep  Knee PROM with end range holds, able to achieve 95 degrees knee flexion in supine  Flexion PROM with overpressure form PT x 3 min, knee motion seemed to improve but was unable to measure.  With prolonged flexion stretch noted HS muscle spasms and used IASTM to L HS musculature x 5 minutes for relief of spasms.   TE  Knee flexion stretch at steps 3 x 45 seconds on LLE working into increased knee flexion range to pt tolerance.    Ambulation training no AD walking over obstacles x 5 minutes, pt encouraged to use smooth gait pattern and increase speed as pt tends to freeze when her gait is  slow  Note: Portions of this document were prepared using Dragon voice recognition software and although reviewed may contain unintentional dictation errors in syntax, grammar, or spelling.    PATIENT EDUCATION: Education details: Goals, Functional outcome measures, Purpose of PT Person educated: Patient Education method: Explanation Education comprehension: verbalized understanding  HOME EXERCISE PROGRAM: Access Code: [email or web address] URL: [email or web address] Date: 04/23/2023 Prepared by: Maureen Ralphs  Exercises - Seated Knee Flexion Stretch  - 2 x daily - 3-4 sets - 20-30 sec hold - Seated Hamstring Stretch  - 2 x daily - 3-4 sets - 20-30 hold    -PWR! Seated exercises handout provided and reviewed each exercise GOALS: Goals reviewed with patient? Yes  SHORT TERM GOALS: Target date: 06/03/2023  Pt will be independent with HEP in order to improve strength and balance in order to decrease fall risk and improve function at home and work.  Baseline: EVAL: No formal HEP in place Goal status: MET   LONG TERM GOALS: Target date: 07/15/2023  Pt will improve FOTO to target  score of 59 to display perceived improvements in ability to complete ADL's.  Baseline: EVAL: 49  8/20:53.2 Goal status: IN PROGRESS  2.  Pt will increase by at least 0.23 m/s in order to demonstrate clinically significant improvement in community ambulation.   Baseline: EVAL= 16.23 sec with cane or 0.62 m/s  8/20: .74 m/s Goal status: INITIAL  3.  Pt will decrease TUG to below 17 seconds/decrease in order to demonstrate decreased fall risk. Baseline: EVAL: 25.52 sec 8/20:22.95 sec  Goal status: IN PROGRESS  4.  Pt will decrease 5TSTS by at least 20 seconds in order to demonstrate clinically significant improvement in LE strength.  Baseline: EVAL: 53.48 sec with B UE support 06/22/23: 31.27 sec  Goal status: IN PROGRESS  5.  Pt will increase by at least 34m (189ft) in order to demonstrate clinically significant improvement in cardiopulmonary endurance and community ambulation  Baseline: EVAL: 730 feet  8/20: 845 ft no AD  Goal status: IN PROGRESS  6. Pt will demonstrate improved left knee AROM knee flex > 100 deg for improved functional mobility including transfers and walking.   Baseline: EVAL: 88 deg  8/20:90 degrees   Goal status: In Progress  7. Pt will improve FGA by at least 3 points in order to demonstrate clinically significant improvement in balance and decreased risk for falls.  Baseline: 04/29/2023=17/30  06/22/23: 17 Goal status: Ongoing   8. Pt will improve BERG by at least 3 points in order to demonstrate clinically significant improvement in balance.  Baseline: 04/29/2023= 35/56  06/22/23: :44  Goal Status: Ongoing   ASSESSMENT:  CLINICAL IMPRESSION:  Patient presents with good motivation for completion of physical therapy activities.  Patient challenged with several exercises and manual therapies to improve left knee flexion range of motion.   Patient instructed to continue to work on knee flexion range of motion at home and particularly on days after  therapy where her knee range of motion may be better than typical days. Pt R LE continues to be a big source of pain and limitation. This was monitored throughout session as we focussed on L knee range of motion to improve her ability to perform functional activities.   Pt will continue to benefit from skilled physical therapy intervention to address impairments, improve QOL, and attain therapy goals.    OBJECTIVE IMPAIRMENTS: Abnormal gait, cardiopulmonary status limiting activity, decreased activity tolerance, decreased balance, decreased coordination, decreased endurance, decreased  mobility, difficulty walking, decreased ROM, decreased strength, hypomobility, impaired flexibility, and pain.   ACTIVITY LIMITATIONS: carrying, lifting, bending, sitting, standing, squatting, sleeping, stairs, and transfers  PARTICIPATION LIMITATIONS: cleaning, laundry, shopping, community activity, and yard work  PERSONAL FACTORS: Age and 1-2 comorbidities: Arthritis, Breast cancer, TKA  are also affecting patient's functional outcome.   REHAB POTENTIAL: Good  CLINICAL DECISION MAKING: Stable/uncomplicated  EVALUATION COMPLEXITY: Low  PLAN:  PT FREQUENCY: 1-2x/week  PT DURATION: 12 weeks  PLANNED INTERVENTIONS: Therapeutic exercises, Therapeutic activity, Neuromuscular re-education, Balance training, Gait training, Patient/Family education, Self Care, Joint mobilization, Stair training, Vestibular training, Canalith repositioning, DME instructions, Dry Needling, Electrical stimulation, Spinal manipulation, Spinal mobilization, Cryotherapy, Moist heat, Manual therapy, and Re-evaluation  PLAN FOR NEXT SESSION:Therex For Bilateral Knee ROM/Strength; balance and gait training.  Work on transfers to empower more use of Left LE. Incorporation of large amplitude movements when appropriate.   Norman Herrlich, PT 07/13/2023, 8:12 AM

## 2023-07-15 ENCOUNTER — Ambulatory Visit: Payer: Medicare Other | Admitting: Physical Therapy

## 2023-07-15 DIAGNOSIS — R262 Difficulty in walking, not elsewhere classified: Secondary | ICD-10-CM | POA: Diagnosis not present

## 2023-07-15 DIAGNOSIS — R2689 Other abnormalities of gait and mobility: Secondary | ICD-10-CM

## 2023-07-15 DIAGNOSIS — R269 Unspecified abnormalities of gait and mobility: Secondary | ICD-10-CM

## 2023-07-15 DIAGNOSIS — M6281 Muscle weakness (generalized): Secondary | ICD-10-CM

## 2023-07-15 DIAGNOSIS — R2681 Unsteadiness on feet: Secondary | ICD-10-CM

## 2023-07-15 NOTE — Therapy (Signed)
OUTPATIENT PHYSICAL THERAPY NEURO TREATMENT/ Recert     Patient Name: Dorothy Mccoy MRN: [id number] DOB:[birth year], 72 y.o., female Today's Date: 07/15/2023   PCP: Dr. Manson Allan REFERRING PROVIDER: Dr. Cristopher Peru  END OF SESSION:  PT End of Session - 07/15/23 1449     Visit Number 15    Number of Visits 24    Date for PT Re-Evaluation 09/09/23    Progress Note Due on Visit 20    PT Start Time 1445    PT Stop Time 1528    PT Time Calculation (min) 43 min    Equipment Utilized During Treatment Gait belt    Activity Tolerance Patient tolerated treatment well    Behavior During Therapy WFL for tasks assessed/performed                      Past Medical History:  Diagnosis Date   Breast cancer (HCC)    bilateral   Chronic constipation    GERD (gastroesophageal reflux disease)    Osteoarthritis    Parkinson disease    Personal history of chemotherapy    Personal history of radiation therapy    PONV (postoperative nausea and vomiting)    Psoriatic arthritis (HCC)    Pulmonary embolism (HCC) 2000   following mastectomy   Past Surgical History:  Procedure Laterality Date   BREAST LUMPECTOMY Right 2005   CATARACT EXTRACTION Bilateral    COLONOSCOPY WITH PROPOFOL N/A 04/10/2022   Procedure: COLONOSCOPY WITH PROPOFOL;  Surgeon: Regis Bill, MD;  Location: [facility] ENDOSCOPY;  Service: Endoscopy;  Laterality: N/A;   INGUINAL HERNIA REPAIR Right 2018   MASTECTOMY Left 2000   MYOMECTOMY     TOTAL KNEE ARTHROPLASTY Left    Patient Active Problem List   Diagnosis Date Noted   Pain and swelling of left lower leg 01/25/2023   Arthralgia of multiple sites 01/26/2022   Asthenia 01/26/2022   Gastroesophageal reflux disease 01/26/2022   History of mastectomy 01/26/2022   History of myomectomy 01/26/2022   Malignant tumor of breast (HCC) 01/26/2022   Postoperative retention of urine 01/26/2022   History of pulmonary embolus (PE) 01/26/2022   Constipation  01/08/2022   History of colonic polyps 01/08/2022   History of total knee arthroplasty 06/19/2021   History of breast cancer in female 06/02/2021   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   REFERRING DIAG: Parkinson's disease  THERAPY DIAG:  Difficulty in walking, not elsewhere classified  Abnormality of gait and mobility  Other abnormalities of gait and mobility  Unsteadiness on feet  Muscle weakness (generalized)  Rationale for Evaluation and Treatment: Rehabilitation  SUBJECTIVE:  SUBJECTIVE STATEMENT:  Pt reports doing well today. Pt denies any recent falls/stumbles since prior session. Pt denies any updates to medications or medical appointment since prior session. Pt reports good compliance with HEP when time permits.    Pt accompanied by: self  PERTINENT HISTORY: Patient is a 72 year old female with referral for Parkinsons disease. She was diagnosed in 2015 and per [name], PA report from visit on 01/20/2023: She reports her balance has been poor as of late. This is why she has started using a walker on a regular basis. She feels she cannot turn around fast and needs to hold on to something when she does. Last fall was around Christmastime. She states since then she has been especially cautious with her gait. She has also  noticed some new dyskinesias over the past six-eight months. She does not feel it is worse at a specific time of day or with relation to medication timing. Her tremor is worst in her right foot, especially when she relaxes at night. She does feel like she is moving slowly overall. She notes all of her symptoms are worse with anxiety and feeling rushed. Because of her balance and dyskinesias, she feels like she is worse overall than at her last appointment.  She has not noticed when her medication wears off. She does not typically notice when her medication kicks in, either.  She notes she is recovering from knee replacement surgery, and this is making her left leg stiff. Otherwise, she does not feel any stiffness. She also has psoriatic arthritis.  She reports feelings of depression and anxiety. She notes she lost her mother in 2022, just before she moved here. She was very close with her. She notes not being able to get out and about has also contributed.  She last had PT for her knee replacement, never for PD.    PAIN:  Yes, 7/10 pain in the Right knee   PRECAUTIONS: Fall  WEIGHT BEARING RESTRICTIONS: No  FALLS: Has patient fallen in last 6 months?  Last fall was Dec 2023 - while trying to decorate her Christmas tree.   LIVING ENVIRONMENT: Lives with: lives alone Lives in: House/apartment Stairs:  2 level home- basement (does not currently use) steps from garage- 3 with rail on right Has following equipment at home: Single point cane, Walker - 2 wheeled, and [employer] - 4 wheeled  PLOF: Independent  PATIENT GOALS: Improve my balance and loosen up my left knee  OBJECTIVE:   DIAGNOSTIC FINDINGS: EXAM: LEFT KNEE - COMPLETE 4+ VIEW   COMPARISON:  None Available.   FINDINGS: Total knee prosthesis observed, with proximally 1.5 mm lucency along the lateral tibial tray component on the frontal projection (frontal zones 3 and 4). Equivocal/minimal lucency along the methacrylate-bone  interface along the femoral component in zone 5. The appearance suggests mild findings of loosening.   Small knee effusion noted.   IMPRESSION: 1. Mild lucency along the lateral tibial tray component (zones 3 and 4) and along the Methacrylate-bone interface along the femoral component (lateral zone 5), suggesting mild loosening. 2. Small knee effusion.     Electronically Signed   By: Gaylyn Rong M.D.   On: 12/31/2022 17:16    COGNITION: Overall cognitive status: Within functional limits for tasks assessed   SENSATION: WFL  COORDINATION: Slow initiation of movement  EDEMA:  None present    POSTURE: rounded shoulders and forward head  LOWER EXTREMITY ROM:     Active  Right Eval  Left Eval  Hip flexion    Hip extension    Hip abduction    Hip adduction    Hip internal rotation    Hip external rotation    Knee flexion 115 88  Knee extension    Ankle dorsiflexion    Ankle plantarflexion    Ankle inversion    Ankle eversion     (Blank rows = not tested)  LOWER EXTREMITY MMT:    MMT Right Eval Left Eval  Hip flexion 4 4  Hip extension 4 4  Hip abduction 4 4  Hip adduction 4 4  Hip internal rotation 4 4  Hip external rotation 4 4  Knee flexion 4 4  Knee extension 4 2-  Ankle dorsiflexion 4 4  Ankle plantarflexion    Ankle inversion    Ankle eversion    (Blank rows = not tested)  TRANSFERS: Assistive device utilized: Single point cane  Sit to stand: CGA Stand to sit: CGA Chair to chair: CGA Floor:  Not assessed   GAIT: Gait pattern: step through pattern, decreased arm swing- Right, decreased arm swing- Left, decreased step length- Right, and decreased step length- Left Distance walked: 730 Assistive device utilized: Single point cane Level of assistance: CGA Comments: decreased overall step length- no observed shuffling  FUNCTIONAL TESTS:  5 times sit to stand: 53.48 sec with UE support * Left knee pain Timed up and go (TUG): 25.52  with cane 6 minute walk test: 730 feet with cane 10 meter walk test: 0.62 m/s  PATIENT SURVEYS:  FOTO 49 with goal of 59  TODAY'S TREATMENT:                                                                                                                               Physical therapy treatment session today consisted of completing assessment of goals and administration of testing as demonstrated and documented in flow sheet, treatment, and goals section of this note. Addition treatments may be found below.    [facility] PT Assessment - 07/15/23 0001       Functional Gait  Assessment   Gait Level Surface Walks 20 ft in less than 5.5 sec, no assistive devices, good speed, no evidence for imbalance, normal gait pattern, deviates no more than 6 in outside of the 12 in walkway width.    Change in Gait Speed Able to change speed, demonstrates mild gait deviations, deviates 6-10 in outside of the 12 in walkway width, or no gait deviations, unable to achieve a major change in velocity, or uses a change in velocity, or uses an assistive device.    Gait with Horizontal Head Turns Performs head turns smoothly with slight change in gait velocity (eg, minor disruption to smooth gait path), deviates 6-10 in outside 12 in walkway width, or uses an assistive device.    Gait with Vertical Head Turns Performs task with moderate change in gait velocity, slows down, deviates 10-15  in outside 12 in walkway width but recovers, can continue to walk.    Gait and Pivot Turn Pivot turns safely in greater than 3 sec and stops with no loss of balance, or pivot turns safely within 3 sec and stops with mild imbalance, requires small steps to catch balance.    Step Over Obstacle Is able to step over one shoe box (4.5 in total height) but must slow down and adjust steps to clear box safely. May require verbal cueing.    Gait with Narrow Base of Support Ambulates 7-9 steps.    Gait with Eyes Closed Walks 20 ft, slow speed, abnormal  gait pattern, evidence for imbalance, deviates 10-15 in outside 12 in walkway width. Requires more than 9 sec to ambulate 20 ft.    Ambulating Backwards Walks 20 ft, uses assistive device, slower speed, mild gait deviations, deviates 6-10 in outside 12 in walkway width.    Steps Alternating feet, must use rail.    Total Score 18               PATIENT EDUCATION: Education details: Goals, Functional outcome measures, Purpose of PT Person educated: Patient Education method: Explanation Education comprehension: verbalized understanding  HOME EXERCISE PROGRAM: Access Code: [email or web address] URL: [email or web address] Date: 04/23/2023 Prepared by: Maureen Ralphs  Exercises - Seated Knee Flexion Stretch  - 2 x daily - 3-4 sets - 20-30 sec hold - Seated Hamstring Stretch  - 2 x daily - 3-4 sets - 20-30 hold    -PWR! Seated exercises handout provided and reviewed each exercise GOALS: Goals reviewed with patient? Yes  SHORT TERM GOALS: Target date: 06/03/2023  Pt will be independent with HEP in order to improve strength and balance in order to decrease fall risk and improve function at home and work.  Baseline: EVAL: No formal HEP in place Goal status: MET   LONG TERM GOALS: Target date: 07/15/2023  Pt will improve FOTO to target score of 59 to display perceived improvements in ability to complete ADL's.  Baseline: EVAL: 49  8/20:53.2 07/15/23: 62 Goal status: MET  2.  Pt will increase by at least 0.23 m/s in order to demonstrate clinically significant improvement in community ambulation.   Baseline: EVAL= 16.23 sec with cane or 0.62 m/s  8/20: .74 m/s 9/12: .9 m/s no AD Goal status: MET  3.  Pt will decrease TUG to below 17 seconds/decrease in order to demonstrate decreased fall risk. Baseline: EVAL: 25.52 sec 8/20:22.95 sec  9/12:18.5 sec Goal status: IN PROGRESS  4.  Pt will decrease 5TSTS by at least 20 seconds in order to demonstrate clinically  significant improvement in LE strength.  Baseline: EVAL: 53.48 sec with B UE support 06/22/23: 31.27 sec 9/12:21.6 sec Goal status: IN PROGRESS  5.  Pt will increase by at least 30m (193ft) in order to demonstrate clinically significant improvement in cardiopulmonary endurance and community ambulation  Baseline: EVAL: 730 feet  8/20: 845 ft no AD  9/12: 1002 ft  Goal status: MET  6. Pt will demonstrate improved left knee AROM knee flex > 100 deg for improved functional mobility including transfers and walking.   Baseline: EVAL: 88 deg  8/20:90 degrees  07/15/23: 90 degrees  Goal status: NOT MET/ DISCONTINUED   7. Pt will improve FGA by at least 3 points in order to demonstrate clinically significant improvement in balance and decreased risk for falls.  Baseline: 04/29/2023=17/30  06/22/23: 17 9/12: 18 Goal status: ONGOING    8.  Pt will improve BERG by at least 3 points in order to demonstrate clinically significant improvement in balance.  Baseline: 04/29/2023= 35/56  06/22/23: :44  Goal Status: MET   ASSESSMENT:  CLINICAL IMPRESSION:  Patient presents for recert note this date. Pt has met or made significant progress toward all of her long term goals with the exception of her goal to improve left knee ROM. ROM on left knee remains relatively unchanged despite extensive intervention. This goal will be discontinued as a result. Pt instructed to follow up with orthopedic doctor if she wishes for further interventions to improve knee range of motion. Pt outstanding goals still within reach and will be target for future interventions.Pt balance and mobility have improved significantly.  Patient's condition has the potential to improve in response to therapy. Maximum improvement is yet to be obtained. The anticipated improvement is attainable and reasonable in a generally predictable time.     OBJECTIVE IMPAIRMENTS: Abnormal gait, cardiopulmonary status limiting activity, decreased  activity tolerance, decreased balance, decreased coordination, decreased endurance, decreased mobility, difficulty walking, decreased ROM, decreased strength, hypomobility, impaired flexibility, and pain.   ACTIVITY LIMITATIONS: carrying, lifting, bending, sitting, standing, squatting, sleeping, stairs, and transfers  PARTICIPATION LIMITATIONS: cleaning, laundry, shopping, community activity, and yard work  PERSONAL FACTORS: Age and 1-2 comorbidities: Arthritis, Breast cancer, TKA  are also affecting patient's functional outcome.   REHAB POTENTIAL: Good  CLINICAL DECISION MAKING: Stable/uncomplicated  EVALUATION COMPLEXITY: Low  PLAN:  PT FREQUENCY: 1-2x/week  PT DURATION: 12 weeks  PLANNED INTERVENTIONS: Therapeutic exercises, Therapeutic activity, Neuromuscular re-education, Balance training, Gait training, Patient/Family education, Self Care, Joint mobilization, Stair training, Vestibular training, Canalith repositioning, DME instructions, Dry Needling, Electrical stimulation, Spinal manipulation, Spinal mobilization, Cryotherapy, Moist heat, Manual therapy, and Re-evaluation  PLAN FOR NEXT SESSION:Therex For Bilateral Knee ROM/Strength; balance and gait training.  Work on transfers to empower more use of Left LE. Incorporation of large amplitude movements when appropriate.   Norman Herrlich, PT 07/15/2023, 4:33 PM

## 2023-07-20 ENCOUNTER — Ambulatory Visit: Payer: Medicare Other | Admitting: Physical Therapy

## 2023-07-20 NOTE — Therapy (Deleted)
OUTPATIENT PHYSICAL THERAPY NEURO TREATMENT    Patient Name: Dorothy Mccoy MRN: [id number] DOB:[birth year], 72 y.o., female Today's Date: 07/20/2023   PCP: Dr. Manson Allan REFERRING PROVIDER: Dr. Cristopher Peru  END OF SESSION:             Past Medical History:  Diagnosis Date   Breast cancer [facility])    bilateral   Chronic constipation    GERD (gastroesophageal reflux disease)    Osteoarthritis    Parkinson disease    Personal history of chemotherapy    Personal history of radiation therapy    PONV (postoperative nausea and vomiting)    Psoriatic arthritis (HCC)    Pulmonary embolism (HCC) 2000   following mastectomy   Past Surgical History:  Procedure Laterality Date   BREAST LUMPECTOMY Right 2005   CATARACT EXTRACTION Bilateral    COLONOSCOPY WITH PROPOFOL N/A 04/10/2022   Procedure: COLONOSCOPY WITH PROPOFOL;  Surgeon: Regis Bill, MD;  Location: [facility] ENDOSCOPY;  Service: Endoscopy;  Laterality: N/A;   INGUINAL HERNIA REPAIR Right 2018   MASTECTOMY Left 2000   MYOMECTOMY     TOTAL KNEE ARTHROPLASTY Left    Patient Active Problem List   Diagnosis Date Noted   Pain and swelling of left lower leg 01/25/2023   Arthralgia of multiple sites 01/26/2022   Asthenia 01/26/2022   Gastroesophageal reflux disease 01/26/2022   History of mastectomy 01/26/2022   History of myomectomy 01/26/2022   Malignant tumor of breast (HCC) 01/26/2022   Postoperative retention of urine 01/26/2022   History of pulmonary embolus (PE) 01/26/2022   Constipation 01/08/2022   History of colonic polyps 01/08/2022   History of total knee arthroplasty 06/19/2021   History of breast cancer in female 06/02/2021   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   REFERRING DIAG: Parkinson's disease  THERAPY DIAG:  Difficulty in walking, not elsewhere classified  Abnormality of gait and mobility  Other abnormalities of gait and mobility  Unsteadiness on feet  Muscle weakness (generalized)  Rationale for Evaluation and Treatment: Rehabilitation  SUBJECTIVE:  SUBJECTIVE STATEMENT:  Pt reports doing well today. Pt denies any recent falls/stumbles since prior session. Pt denies any updates to medications or medical appointment since prior session. Pt reports good compliance with HEP when time permits.    Pt accompanied by: self  PERTINENT HISTORY: Patient is a 72 year old female with referral for Parkinsons disease. She was diagnosed in 2015 and per [name], PA report from visit on 01/20/2023: She reports her balance has been poor as of late. This is why she has started using a walker on a regular basis. She feels she cannot turn around fast and needs to hold on to something when she does. Last fall was around Christmastime. She states since then she has been especially cautious with her gait. She has also noticed some new dyskinesias over the past six-eight months. She does not feel it is worse at a specific time of day or with relation to medication timing. Her tremor is worst in her right foot, especially when she relaxes at night. She does feel like she is moving slowly overall. She notes all of her symptoms are worse with anxiety and feeling rushed. Because of her balance and dyskinesias, she feels like she is worse  overall than at her last appointment.  She has not noticed when her medication wears off. She does not typically notice when her medication kicks in, either.  She notes she is recovering from knee replacement surgery, and this is making her left leg stiff. Otherwise, she does not feel any stiffness. She also has psoriatic arthritis.  She reports feelings of depression and anxiety. She notes she lost her mother in 2022, just before she moved here. She was very close with her. She notes not being able to get out and about has also contributed.  She last had PT for her knee replacement, never for PD.    PAIN:  Yes, 7/10 pain in the Right knee   PRECAUTIONS: Fall  WEIGHT BEARING RESTRICTIONS: No  FALLS: Has patient fallen in last 6 months?  Last fall was Dec 2023 - while trying to decorate her Christmas tree.   LIVING ENVIRONMENT: Lives with: lives alone Lives in: House/apartment Stairs:  2 level home- basement (does not currently use) steps from garage- 3 with rail on right Has following equipment at home: Single point cane, Walker - 2 wheeled, and [employer] - 4 wheeled  PLOF: Independent  PATIENT GOALS: Improve my balance and loosen up my left knee  OBJECTIVE:   DIAGNOSTIC FINDINGS: EXAM: LEFT KNEE - COMPLETE 4+ VIEW   COMPARISON:  None Available.   FINDINGS: Total knee prosthesis observed, with proximally 1.5 mm lucency along the lateral tibial tray component on the frontal projection (frontal zones 3 and 4). Equivocal/minimal lucency along the methacrylate-bone interface along the femoral component in zone 5. The appearance suggests mild findings of loosening.   Small knee effusion noted.   IMPRESSION: 1. Mild lucency along the lateral tibial tray component (zones 3 and 4) and along the Methacrylate-bone interface along the femoral component (lateral zone 5), suggesting mild loosening. 2. Small knee effusion.     Electronically Signed   By: Gaylyn Rong  M.D.   On: 12/31/2022 17:16    COGNITION: Overall cognitive status: Within functional limits for tasks assessed   SENSATION: WFL  COORDINATION: Slow initiation of movement  EDEMA:  None present    POSTURE: rounded shoulders and forward head  LOWER EXTREMITY ROM:     Active  Right Eval  Left Eval  Hip flexion    Hip extension    Hip abduction    Hip adduction    Hip internal rotation    Hip external rotation    Knee flexion 115 88  Knee extension    Ankle dorsiflexion    Ankle plantarflexion    Ankle inversion    Ankle eversion     (Blank rows = not tested)  LOWER EXTREMITY MMT:    MMT Right Eval Left Eval  Hip flexion 4 4  Hip extension 4 4  Hip abduction 4 4  Hip adduction 4 4  Hip internal rotation 4 4  Hip external rotation 4 4  Knee flexion 4 4  Knee extension 4 2-  Ankle dorsiflexion 4 4  Ankle plantarflexion    Ankle inversion    Ankle eversion    (Blank rows = not tested)  TRANSFERS: Assistive device utilized: Single point cane  Sit to stand: CGA Stand to sit: CGA Chair to chair: CGA Floor:  Not assessed   GAIT: Gait pattern: step through pattern, decreased arm swing- Right, decreased arm swing- Left, decreased step length- Right, and decreased step length- Left Distance walked: 730 Assistive device utilized: Single point cane Level of assistance: CGA Comments: decreased overall step length- no observed shuffling  FUNCTIONAL TESTS:  5 times sit to stand: 53.48 sec with UE support * Left knee pain Timed up and go (TUG): 25.52 with cane 6 minute walk test: 730 feet with cane 10 meter walk test: 0.62 m/s  PATIENT SURVEYS:  FOTO 49 with goal of 59  TODAY'S TREATMENT:                                                                                                                               Nustep B UE and LE for large reciprocal movement training  Level 3 x 6 min   Standing PWR! Moves x 10 ea  - PWR! Up works on postural  strengthening and antigravity extension, PRW! Rock working on [employer] shifting, PRW! Twist targeting trunk rotation and PRW! Step targeting transition movements. PWR! Moves target bradykinesia, rigidity, and dyskinesia through targeted functional movements that address four core movement difficulties for people with Parkinson's disease.  Stepping on / off airex ant and laterally x 10 ea LE and ea   Step over and back using orange hurdles x 10 ea      PATIENT EDUCATION: Education details: Goals, Functional outcome measures, Purpose of PT Person educated: Patient Education method: Explanation Education comprehension: verbalized understanding  HOME EXERCISE PROGRAM: Access Code: [email or web address] URL: [email or web address] Date: 04/23/2023 Prepared by: Maureen Ralphs  Exercises - Seated Knee Flexion Stretch  - 2 x daily - 3-4 sets - 20-30 sec hold - Seated Hamstring Stretch  - 2 x daily - 3-4 sets - 20-30 hold    -PWR! Seated exercises handout provided and reviewed each exercise GOALS: Goals reviewed with patient? Yes  SHORT TERM  GOALS: Target date: 06/03/2023  Pt will be independent with HEP in order to improve strength and balance in order to decrease fall risk and improve function at home and work.  Baseline: EVAL: No formal HEP in place Goal status: MET   LONG TERM GOALS: Target date: 07/15/2023  Pt will improve FOTO to target score of 59 to display perceived improvements in ability to complete ADL's.  Baseline: EVAL: 49  8/20:53.2 07/15/23: 62 Goal status: MET  2.  Pt will increase by at least 0.23 m/s in order to demonstrate clinically significant improvement in community ambulation.   Baseline: EVAL= 16.23 sec with cane or 0.62 m/s  8/20: .74 m/s 9/12: .9 m/s no AD Goal status: MET  3.  Pt will decrease TUG to below 17 seconds/decrease in order to demonstrate decreased fall risk. Baseline: EVAL: 25.52 sec 8/20:22.95 sec  9/12:18.5 sec Goal  status: IN PROGRESS  4.  Pt will decrease 5TSTS by at least 20 seconds in order to demonstrate clinically significant improvement in LE strength.  Baseline: EVAL: 53.48 sec with B UE support 06/22/23: 31.27 sec 9/12:21.6 sec Goal status: IN PROGRESS  5.  Pt will increase by at least 65m (119ft) in order to demonstrate clinically significant improvement in cardiopulmonary endurance and community ambulation  Baseline: EVAL: 730 feet  8/20: 845 ft no AD  9/12: 1002 ft  Goal status: MET  6. Pt will demonstrate improved left knee AROM knee flex > 100 deg for improved functional mobility including transfers and walking.   Baseline: EVAL: 88 deg  8/20:90 degrees  07/15/23: 90 degrees  Goal status: NOT MET/ DISCONTINUED   7. Pt will improve FGA by at least 3 points in order to demonstrate clinically significant improvement in balance and decreased risk for falls.  Baseline: 04/29/2023=17/30  06/22/23: 17 9/12: 18 Goal status: ONGOING    8. Pt will improve BERG by at least 3 points in order to demonstrate clinically significant improvement in balance.  Baseline: 04/29/2023= 35/56  06/22/23: :44  Goal Status: MET   ASSESSMENT:  CLINICAL IMPRESSION:  Patient presents for recert note this date. Pt has met or made significant progress toward all of her long term goals with the exception of her goal to improve left knee ROM. ROM on left knee remains relatively unchanged despite extensive intervention. This goal will be discontinued as a result. Pt instructed to follow up with orthopedic doctor if she wishes for further interventions to improve knee range of motion. Pt outstanding goals still within reach and will be target for future interventions.Pt balance and mobility have improved significantly.  Patient's condition has the potential to improve in response to therapy. Maximum improvement is yet to be obtained. The anticipated improvement is attainable and reasonable in a generally predictable  time.     OBJECTIVE IMPAIRMENTS: Abnormal gait, cardiopulmonary status limiting activity, decreased activity tolerance, decreased balance, decreased coordination, decreased endurance, decreased mobility, difficulty walking, decreased ROM, decreased strength, hypomobility, impaired flexibility, and pain.   ACTIVITY LIMITATIONS: carrying, lifting, bending, sitting, standing, squatting, sleeping, stairs, and transfers  PARTICIPATION LIMITATIONS: cleaning, laundry, shopping, community activity, and yard work  PERSONAL FACTORS: Age and 1-2 comorbidities: Arthritis, Breast cancer, TKA  are also affecting patient's functional outcome.   REHAB POTENTIAL: Good  CLINICAL DECISION MAKING: Stable/uncomplicated  EVALUATION COMPLEXITY: Low  PLAN:  PT FREQUENCY: 1-2x/week  PT DURATION: 12 weeks  PLANNED INTERVENTIONS: Therapeutic exercises, Therapeutic activity, Neuromuscular re-education, Balance training, Gait training, Patient/Family education, Self Care, Joint  mobilization, Stair training, Vestibular training, Canalith repositioning, DME instructions, Dry Needling, Electrical stimulation, Spinal manipulation, Spinal mobilization, Cryotherapy, Moist heat, Manual therapy, and Re-evaluation  PLAN FOR NEXT SESSION:Therex For Bilateral Knee ROM/Strength; balance and gait training.  Work on transfers to empower more use of Left LE. Incorporation of large amplitude movements when appropriate.   Norman Herrlich, PT 07/20/2023, 9:09 AM

## 2023-07-22 ENCOUNTER — Ambulatory Visit: Payer: Medicare Other | Admitting: Physical Therapy

## 2023-07-27 ENCOUNTER — Ambulatory Visit: Payer: Medicare Other | Admitting: Physical Therapy

## 2023-07-29 ENCOUNTER — Ambulatory Visit: Payer: Medicare Other | Admitting: Physical Therapy

## 2023-08-03 ENCOUNTER — Ambulatory Visit: Payer: Medicare Other | Admitting: Physical Therapy

## 2023-08-05 ENCOUNTER — Ambulatory Visit: Payer: Medicare Other | Attending: Neurology | Admitting: Physical Therapy

## 2023-08-05 DIAGNOSIS — M6281 Muscle weakness (generalized): Secondary | ICD-10-CM | POA: Diagnosis present

## 2023-08-05 DIAGNOSIS — R2681 Unsteadiness on feet: Secondary | ICD-10-CM | POA: Insufficient documentation

## 2023-08-05 DIAGNOSIS — R2689 Other abnormalities of gait and mobility: Secondary | ICD-10-CM | POA: Diagnosis present

## 2023-08-05 DIAGNOSIS — R269 Unspecified abnormalities of gait and mobility: Secondary | ICD-10-CM | POA: Diagnosis present

## 2023-08-05 DIAGNOSIS — R262 Difficulty in walking, not elsewhere classified: Secondary | ICD-10-CM | POA: Diagnosis present

## 2023-08-05 NOTE — Therapy (Signed)
OUTPATIENT PHYSICAL THERAPY NEURO TREATMENT    Patient Name: Dorothy Mccoy MRN: [id number] DOB:Jul 17, 1951, 72 y.o., female Today's Date: 08/05/2023   PCP: Dr. Manson Allan REFERRING PROVIDER: Dr. Cristopher Peru  END OF SESSION:  PT End of Session - 08/05/23 1447     Visit Number 16    Number of Visits 24    Date for PT Re-Evaluation 09/09/23    Progress Note Due on Visit 20    PT Start Time 1447    PT Stop Time 1528    PT Time Calculation (min) 41 min    Equipment Utilized During Treatment Gait belt    Activity Tolerance Patient tolerated treatment well    Behavior During Therapy WFL for tasks assessed/performed                       Past Medical History:  Diagnosis Date   Breast cancer (HCC)    bilateral   Chronic constipation    GERD (gastroesophageal reflux disease)    Osteoarthritis    Parkinson disease    Personal history of chemotherapy    Personal history of radiation therapy    PONV (postoperative nausea and vomiting)    Psoriatic arthritis (HCC)    Pulmonary embolism (HCC) 2000   following mastectomy   Past Surgical History:  Procedure Laterality Date   BREAST LUMPECTOMY Right 2005   CATARACT EXTRACTION Bilateral    COLONOSCOPY WITH PROPOFOL N/A 04/10/2022   Procedure: COLONOSCOPY WITH PROPOFOL;  Surgeon: Regis Bill, MD;  Location: [facility] ENDOSCOPY;  Service: Endoscopy;  Laterality: N/A;   INGUINAL HERNIA REPAIR Right 2018   MASTECTOMY Left 2000   MYOMECTOMY     TOTAL KNEE ARTHROPLASTY Left    Patient Active Problem List   Diagnosis Date Noted   Pain and swelling of left lower leg 01/25/2023   Arthralgia of multiple sites 01/26/2022   Asthenia 01/26/2022   Gastroesophageal reflux disease 01/26/2022   History of mastectomy 01/26/2022   History of myomectomy 01/26/2022   Malignant tumor of breast (HCC) 01/26/2022   Postoperative retention of urine 01/26/2022   History of pulmonary embolus (PE) 01/26/2022   Constipation 01/08/2022    History of colonic polyps 01/08/2022   History of total knee arthroplasty 06/19/2021   History of breast cancer in female 06/02/2021   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   REFERRING DIAG: Parkinson's disease  THERAPY DIAG:  Difficulty in walking, not elsewhere classified  Abnormality of gait and mobility  Other abnormalities of gait and mobility  Unsteadiness on feet  Rationale for Evaluation and Treatment: Rehabilitation  SUBJECTIVE:  SUBJECTIVE STATEMENT:  Pt reports coming off of a bug her brother and her had. Her brother gave it to her and she has been sick. Pt is feeling better but still trying to get her strength back.     Pt accompanied by: self  PERTINENT HISTORY: Patient is a 72 year old female with referral for Parkinsons disease. She was diagnosed in 2015 and per [name], PA report from visit on 01/20/2023: She reports her balance has been poor as of late. This is why she has started using a walker on a regular basis. She feels she cannot turn around fast and needs to hold on to something when she does. Last fall was around Christmastime. She states since then she has been especially cautious with her gait. She has also noticed some new dyskinesias over the past six-eight months. She does not feel it is  worse at a specific time of day or with relation to medication timing. Her tremor is worst in her right foot, especially when she relaxes at night. She does feel like she is moving slowly overall. She notes all of her symptoms are worse with anxiety and feeling rushed. Because of her balance and dyskinesias, she feels like she is worse overall than at her last appointment.  She has not noticed when her medication wears off. She does not typically notice when her medication kicks in, either.  She notes she is recovering from knee replacement surgery, and this is making her left leg stiff. Otherwise, she does not feel any stiffness. She also has psoriatic arthritis.  She reports feelings of depression and anxiety. She notes she lost her mother in 2022, just before she moved here. She was very close with her. She notes not being able to get out and about has also contributed.  She last had PT for her knee replacement, never for PD.    PAIN:  Yes, 7/10 pain in the Right knee   PRECAUTIONS: Fall  WEIGHT BEARING RESTRICTIONS: No  FALLS: Has patient fallen in last 6 months?  Last fall was Dec 2023 - while trying to decorate her Christmas tree.   LIVING ENVIRONMENT: Lives with: lives alone Lives in: House/apartment Stairs:  2 level home- basement (does not currently use) steps from garage- 3 with rail on right Has following equipment at home: Single point cane, Walker - 2 wheeled, and [employer] - 4 wheeled  PLOF: Independent  PATIENT GOALS: Improve my balance and loosen up my left knee  OBJECTIVE:   DIAGNOSTIC FINDINGS: EXAM: LEFT KNEE - COMPLETE 4+ VIEW   COMPARISON:  None Available.   FINDINGS: Total knee prosthesis observed, with proximally 1.5 mm lucency along the lateral tibial tray component on the frontal projection (frontal zones 3 and 4). Equivocal/minimal lucency along the methacrylate-bone interface along the femoral component in zone 5. The appearance suggests mild  findings of loosening.   Small knee effusion noted.   IMPRESSION: 1. Mild lucency along the lateral tibial tray component (zones 3 and 4) and along the Methacrylate-bone interface along the femoral component (lateral zone 5), suggesting mild loosening. 2. Small knee effusion.     Electronically Signed   By: Gaylyn Rong M.D.   On: 12/31/2022 17:16    COGNITION: Overall cognitive status: Within functional limits for tasks assessed   SENSATION: WFL  COORDINATION: Slow initiation of movement  EDEMA:  None present    POSTURE: rounded shoulders and forward head  LOWER EXTREMITY ROM:     Active  Right Eval Left Eval  Hip flexion    Hip extension    Hip abduction    Hip adduction    Hip internal rotation    Hip external rotation    Knee flexion 115 88  Knee extension    Ankle dorsiflexion    Ankle plantarflexion    Ankle inversion    Ankle eversion     (Blank rows = not tested)  LOWER EXTREMITY MMT:    MMT Right Eval Left Eval  Hip flexion 4 4  Hip extension 4 4  Hip abduction 4 4  Hip adduction 4 4  Hip internal rotation 4 4  Hip external rotation 4 4  Knee flexion 4 4  Knee extension 4 2-  Ankle dorsiflexion 4 4  Ankle plantarflexion    Ankle inversion    Ankle eversion    (Blank rows = not tested)  TRANSFERS: Assistive device utilized: Single point cane  Sit to stand: CGA Stand to sit: CGA Chair to chair: CGA Floor:  Not assessed   GAIT: Gait pattern: step through pattern, decreased arm swing- Right, decreased arm swing- Left, decreased step length- Right, and decreased step length- Left Distance walked: 730 Assistive device utilized: Single point cane Level of assistance: CGA Comments: decreased overall step length- no observed shuffling  FUNCTIONAL TESTS:  5 times sit to stand: 53.48 sec with UE support * Left knee pain Timed up and go (TUG): 25.52 with cane 6 minute walk test: 730 feet with cane 10 meter walk test: 0.62  m/s  PATIENT SURVEYS:  FOTO 49 with goal of 59  TODAY'S TREATMENT:       NMR                                                                                                                Seated PWR! Moves x 15 of UP, then 10 ea of rock, twist, and step  -modified the step to single step with ea LE with 5# AW, initially decreased pain but eventually bothered the knee again.  Standing PWR! Moves x 10 ea  - PWR! Up works on postural strengthening and antigravity extension, PRW! Rock working on [employer] shifting, PRW! Twist targeting trunk rotation and PRW! Step targeting transition movements. PWR! Moves target bradykinesia, rigidity, and dyskinesia through targeted functional movements that address four core movement difficulties for people with Parkinson's disease.  TE LAQ with 5# AW 2 x 5 ea LE   Seated hamstring curl with green Thera-Band resistance on left lower extremity and red Thera-Band resistance on right lower extremity.  No reports of knee pain with this activity  Sidestepping with RTB around tibia/fibula ( mid shank) 2 x 10 ea direction   NMR  Narrow base of support standing on Airex pad x 30 seconds.  No loss of balance or significant sway noted  Step on and off of Airex pad x 8 to each side   PATIENT EDUCATION: Education details: Goals, Functional outcome measures, Purpose of PT Person educated: Patient Education  method: Explanation Education comprehension: verbalized understanding  HOME EXERCISE PROGRAM: Access Code: [email or web address] URL: [email or web address] Date: 04/23/2023   -PWR! Seated exercises handout provided and reviewed each exercise GOALS: Goals reviewed with patient? Yes  SHORT TERM GOALS: Target date: 06/03/2023  Pt will be independent with HEP in order to improve strength and balance in order to decrease fall risk and improve function at home and work.  Baseline: EVAL: No formal HEP in place Goal status: MET   LONG TERM GOALS:  Target date: 07/15/2023  Pt will improve FOTO to target score of 59 to display perceived improvements in ability to complete ADL's.  Baseline: EVAL: 49  8/20:53.2 07/15/23: 62 Goal status: MET  2.  Pt will increase by at least 0.23 m/s in order to demonstrate clinically significant improvement in community ambulation.   Baseline: EVAL= 16.23 sec with cane or 0.62 m/s  8/20: .74 m/s 9/12: .9 m/s no AD Goal status: MET  3.  Pt will decrease TUG to below 17 seconds/decrease in order to demonstrate decreased fall risk. Baseline: EVAL: 25.52 sec 8/20:22.95 sec  9/12:18.5 sec Goal status: IN PROGRESS  4.  Pt will decrease 5TSTS by at least 20 seconds in order to demonstrate clinically significant improvement in LE strength.  Baseline: EVAL: 53.48 sec with B UE support 06/22/23: 31.27 sec 9/12:21.6 sec Goal status: IN PROGRESS  5.  Pt will increase by at least 64m (199ft) in order to demonstrate clinically significant improvement in cardiopulmonary endurance and community ambulation  Baseline: EVAL: 730 feet  8/20: 845 ft no AD  9/12: 1002 ft  Goal status: MET  6. Pt will demonstrate improved left knee AROM knee flex > 100 deg for improved functional mobility including transfers and walking.   Baseline: EVAL: 88 deg  8/20:90 degrees  07/15/23: 90 degrees  Goal status: NOT MET/ DISCONTINUED   7. Pt will improve FGA by at least 3 points in order to demonstrate clinically significant improvement in balance and decreased risk for falls.  Baseline: 04/29/2023=17/30  06/22/23: 17 9/12: 18 Goal status: ONGOING    8. Pt will improve BERG by at least 3 points in order to demonstrate clinically significant improvement in balance.  Baseline: 04/29/2023= 35/56  06/22/23: :44  Goal Status: MET   ASSESSMENT:  CLINICAL IMPRESSION:  Patient presents with good motivation for completion of physical therapy activities.  Patient initially feeling weak and was brought through several seated  exercises.  As patient progressed patient's competence improved and was able to perform some standing activities with good effort and good balance overall.  Patient is going on a weeklong vacation and will return to therapy following that.  Patient is eager to continue with exercises when she is on her vacation.Pt will continue to benefit from skilled physical therapy intervention to address impairments, improve QOL, and attain therapy goals.     OBJECTIVE IMPAIRMENTS: Abnormal gait, cardiopulmonary status limiting activity, decreased activity tolerance, decreased balance, decreased coordination, decreased endurance, decreased mobility, difficulty walking, decreased ROM, decreased strength, hypomobility, impaired flexibility, and pain.   ACTIVITY LIMITATIONS: carrying, lifting, bending, sitting, standing, squatting, sleeping, stairs, and transfers  PARTICIPATION LIMITATIONS: cleaning, laundry, shopping, community activity, and yard work  PERSONAL FACTORS: Age and 1-2 comorbidities: Arthritis, Breast cancer, TKA  are also affecting patient's functional outcome.   REHAB POTENTIAL: Good  CLINICAL DECISION MAKING: Stable/uncomplicated  EVALUATION COMPLEXITY: Low  PLAN:  PT FREQUENCY: 1-2x/week  PT DURATION: 12 weeks  PLANNED INTERVENTIONS: Therapeutic exercises, Therapeutic activity, Neuromuscular re-education,  Balance training, Gait training, Patient/Family education, Self Care, Joint mobilization, Stair training, Vestibular training, Canalith repositioning, DME instructions, Dry Needling, Electrical stimulation, Spinal manipulation, Spinal mobilization, Cryotherapy, Moist heat, Manual therapy, and Re-evaluation  PLAN FOR NEXT SESSION balance and gait training.  Work on transfers to empower more use of Left LE. Incorporation of large amplitude movements when appropriate.   Norman Herrlich, PT 08/05/2023, 4:01 PM

## 2023-08-10 ENCOUNTER — Ambulatory Visit: Payer: Medicare Other | Admitting: Physical Therapy

## 2023-08-12 ENCOUNTER — Ambulatory Visit: Payer: Medicare Other | Admitting: Physical Therapy

## 2023-08-17 ENCOUNTER — Ambulatory Visit: Payer: Medicare Other | Admitting: Physical Therapy

## 2023-08-17 DIAGNOSIS — R269 Unspecified abnormalities of gait and mobility: Secondary | ICD-10-CM

## 2023-08-17 DIAGNOSIS — R2689 Other abnormalities of gait and mobility: Secondary | ICD-10-CM

## 2023-08-17 DIAGNOSIS — R2681 Unsteadiness on feet: Secondary | ICD-10-CM

## 2023-08-17 DIAGNOSIS — R262 Difficulty in walking, not elsewhere classified: Secondary | ICD-10-CM | POA: Diagnosis not present

## 2023-08-17 DIAGNOSIS — M6281 Muscle weakness (generalized): Secondary | ICD-10-CM

## 2023-08-17 NOTE — Therapy (Signed)
OUTPATIENT PHYSICAL THERAPY NEURO TREATMENT    Patient Name: Dorothy Mccoy MRN: [id number] DOB:02/26/1951, 72 y.o., female Today's Date: 08/17/2023   PCP: Dr. Manson Allan REFERRING PROVIDER: Dr. Cristopher Peru  END OF SESSION:  PT End of Session - 08/17/23 1500     Visit Number 17    Number of Visits 24    Date for PT Re-Evaluation 09/09/23    Progress Note Due on Visit 20    PT Start Time 1447    PT Stop Time 1528    PT Time Calculation (min) 41 min    Equipment Utilized During Treatment Gait belt    Activity Tolerance Patient tolerated treatment well    Behavior During Therapy WFL for tasks assessed/performed                       Past Medical History:  Diagnosis Date   Breast cancer (HCC)    bilateral   Chronic constipation    GERD (gastroesophageal reflux disease)    Osteoarthritis    Parkinson disease    Personal history of chemotherapy    Personal history of radiation therapy    PONV (postoperative nausea and vomiting)    Psoriatic arthritis (HCC)    Pulmonary embolism (HCC) 2000   following mastectomy   Past Surgical History:  Procedure Laterality Date   BREAST LUMPECTOMY Right 2005   CATARACT EXTRACTION Bilateral    COLONOSCOPY WITH PROPOFOL N/A 04/10/2022   Procedure: COLONOSCOPY WITH PROPOFOL;  Surgeon: Regis Bill, MD;  Location: [facility] ENDOSCOPY;  Service: Endoscopy;  Laterality: N/A;   INGUINAL HERNIA REPAIR Right 2018   MASTECTOMY Left 2000   MYOMECTOMY     TOTAL KNEE ARTHROPLASTY Left    Patient Active Problem List   Diagnosis Date Noted   Pain and swelling of left lower leg 01/25/2023   Arthralgia of multiple sites 01/26/2022   Asthenia 01/26/2022   Gastroesophageal reflux disease 01/26/2022   History of mastectomy 01/26/2022   History of myomectomy 01/26/2022   Malignant tumor of breast (HCC) 01/26/2022   Postoperative retention of urine 01/26/2022   History of pulmonary embolus (PE) 01/26/2022   Constipation  01/08/2022   History of colonic polyps 01/08/2022   History of total knee arthroplasty 06/19/2021   History of breast cancer in female 06/02/2021   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   REFERRING DIAG: Parkinson's disease  THERAPY DIAG:  Difficulty in walking, not elsewhere classified  Abnormality of gait and mobility  Other abnormalities of gait and mobility  Unsteadiness on feet  Muscle weakness (generalized)  Rationale for Evaluation and Treatment: Rehabilitation  SUBJECTIVE:  SUBJECTIVE STATEMENT:  Pt reports she had a good trip to the beach but she was not able to do her home exercises as more often as she had wanted to.   Pt accompanied by: self  PERTINENT HISTORY: Patient is a 72 year old female with referral for Parkinsons disease. She was diagnosed in 2015 and per [name], PA report from visit on 01/20/2023: She reports her balance has been poor as of late. This is why she has started using a walker on a regular basis. She feels she cannot turn around fast and needs to hold on to something when she does. Last fall was around Christmastime. She states since then she has been especially cautious with her gait. She has also noticed some new dyskinesias over the past six-eight months. She does not feel it is worse  at a specific time of day or with relation to medication timing. Her tremor is worst in her right foot, especially when she relaxes at night. She does feel like she is moving slowly overall. She notes all of her symptoms are worse with anxiety and feeling rushed. Because of her balance and dyskinesias, she feels like she is worse overall than at her last appointment.  She has not noticed when her medication wears off. She does not typically notice when her medication kicks in, either.  She notes she is recovering from knee replacement surgery, and this is making her left leg stiff. Otherwise, she does not feel any stiffness. She also has psoriatic arthritis.  She reports feelings of depression and anxiety. She notes she lost her mother in 2022, just before she moved here. She was very close with her. She notes not being able to get out and about has also contributed.  She last had PT for her knee replacement, never for PD.    PAIN:  Yes, 7/10 pain in the Right knee   PRECAUTIONS: Fall  WEIGHT BEARING RESTRICTIONS: No  FALLS: Has patient fallen in last 6 months?  Last fall was Dec 2023 - while trying to decorate her Christmas tree.   LIVING ENVIRONMENT: Lives with: lives alone Lives in: House/apartment Stairs:  2 level home- basement (does not currently use) steps from garage- 3 with rail on right Has following equipment at home: Single point cane, Walker - 2 wheeled, and [employer] - 4 wheeled  PLOF: Independent  PATIENT GOALS: Improve my balance and loosen up my left knee  OBJECTIVE:   DIAGNOSTIC FINDINGS: EXAM: LEFT KNEE - COMPLETE 4+ VIEW   COMPARISON:  None Available.   FINDINGS: Total knee prosthesis observed, with proximally 1.5 mm lucency along the lateral tibial tray component on the frontal projection (frontal zones 3 and 4). Equivocal/minimal lucency along the methacrylate-bone interface along the femoral component in zone 5. The appearance suggests mild findings of  loosening.   Small knee effusion noted.   IMPRESSION: 1. Mild lucency along the lateral tibial tray component (zones 3 and 4) and along the Methacrylate-bone interface along the femoral component (lateral zone 5), suggesting mild loosening. 2. Small knee effusion.     Electronically Signed   By: Gaylyn Rong M.D.   On: 12/31/2022 17:16    COGNITION: Overall cognitive status: Within functional limits for tasks assessed   SENSATION: WFL  COORDINATION: Slow initiation of movement  EDEMA:  None present    POSTURE: rounded shoulders and forward head  LOWER EXTREMITY ROM:     Active  Right Eval Left Eval  Hip flexion  Hip extension    Hip abduction    Hip adduction    Hip internal rotation    Hip external rotation    Knee flexion 115 88  Knee extension    Ankle dorsiflexion    Ankle plantarflexion    Ankle inversion    Ankle eversion     (Blank rows = not tested)  LOWER EXTREMITY MMT:    MMT Right Eval Left Eval  Hip flexion 4 4  Hip extension 4 4  Hip abduction 4 4  Hip adduction 4 4  Hip internal rotation 4 4  Hip external rotation 4 4  Knee flexion 4 4  Knee extension 4 2-  Ankle dorsiflexion 4 4  Ankle plantarflexion    Ankle inversion    Ankle eversion    (Blank rows = not tested)  TRANSFERS: Assistive device utilized: Single point cane  Sit to stand: CGA Stand to sit: CGA Chair to chair: CGA Floor:  Not assessed   GAIT: Gait pattern: step through pattern, decreased arm swing- Right, decreased arm swing- Left, decreased step length- Right, and decreased step length- Left Distance walked: 730 Assistive device utilized: Single point cane Level of assistance: CGA Comments: decreased overall step length- no observed shuffling  FUNCTIONAL TESTS:  5 times sit to stand: 53.48 sec with UE support * Left knee pain Timed up and go (TUG): 25.52 with cane 6 minute walk test: 730 feet with cane 10 meter walk test: 0.62 m/s  PATIENT  SURVEYS:  FOTO 49 with goal of 59  TODAY'S TREATMENT:       NMR                                                                                                                 Seated PWR! Moves x 15 of UP, then 10 ea of rock, twist, and step   Standing PWR! Moves x 10 ea (Up, rock, twist and step) Modified PWR! Up to involve STS from blue airex pad on standard chair.   - PWR! Up works on postural strengthening and antigravity extension, PRW! Rock working on [employer] shifting, PRW! Twist targeting trunk rotation and PRW! Step targeting transition movements. PWR! Moves target bradykinesia, rigidity, and dyskinesia through targeted functional movements that address four core movement difficulties for people with Parkinson's disease.  Ambulation with metronome and with 4# AW x 500 ft x 2 rounds  -75 BPM round 1, 80 BPM round 2   TE  Seated hamstring curl with green Thera-Band resistance on left lower extremity and red Thera-Band resistance on right lower extremity.  No reports of knee pain with this activity   PATIENT EDUCATION: Education details: Goals, Functional outcome measures, Purpose of PT Person educated: Patient Education method: Explanation Education comprehension: verbalized understanding  HOME EXERCISE PROGRAM: Access Code: [email or web address] URL: [email or web address] Date: 04/23/2023   -PWR! Seated exercises handout provided and reviewed each exercise GOALS: Goals reviewed with patient? Yes  SHORT TERM GOALS: Target date: 06/03/2023  Pt will be independent with HEP  in order to improve strength and balance in order to decrease fall risk and improve function at home and work.  Baseline: EVAL: No formal HEP in place Goal status: MET   LONG TERM GOALS: Target date: 07/15/2023  Pt will improve FOTO to target score of 59 to display perceived improvements in ability to complete ADL's.  Baseline: EVAL: 49  8/20:53.2 07/15/23: 62 Goal status: MET  2.  Pt  will increase by at least 0.23 m/s in order to demonstrate clinically significant improvement in community ambulation.   Baseline: EVAL= 16.23 sec with cane or 0.62 m/s  8/20: .74 m/s 9/12: .9 m/s no AD Goal status: MET  3.  Pt will decrease TUG to below 17 seconds/decrease in order to demonstrate decreased fall risk. Baseline: EVAL: 25.52 sec 8/20:22.95 sec  9/12:18.5 sec Goal status: IN PROGRESS  4.  Pt will decrease 5TSTS by at least 20 seconds in order to demonstrate clinically significant improvement in LE strength.  Baseline: EVAL: 53.48 sec with B UE support 06/22/23: 31.27 sec 9/12:21.6 sec Goal status: IN PROGRESS  5.  Pt will increase by at least 66m (130ft) in order to demonstrate clinically significant improvement in cardiopulmonary endurance and community ambulation  Baseline: EVAL: 730 feet  8/20: 845 ft no AD  9/12: 1002 ft  Goal status: MET  6. Pt will demonstrate improved left knee AROM knee flex > 100 deg for improved functional mobility including transfers and walking.   Baseline: EVAL: 88 deg  8/20:90 degrees  07/15/23: 90 degrees  Goal status: NOT MET/ DISCONTINUED   7. Pt will improve FGA by at least 3 points in order to demonstrate clinically significant improvement in balance and decreased risk for falls.  Baseline: 04/29/2023=17/30  06/22/23: 17 9/12: 18 Goal status: ONGOING    8. Pt will improve BERG by at least 3 points in order to demonstrate clinically significant improvement in balance.  Baseline: 04/29/2023= 35/56  06/22/23: :44  Goal Status: MET   ASSESSMENT:  CLINICAL IMPRESSION:  Patient presents with good motivation for completion of physical therapy activities.  Patient progresses with ambulation test with metronome this date.  Patient has improved confidence and efficacy with ambulation following this and is able to walk out of therapy clinic following coming in on transport chair.  Pt will continue to benefit from skilled physical  therapy intervention to address impairments, improve QOL, and attain therapy goals.     OBJECTIVE IMPAIRMENTS: Abnormal gait, cardiopulmonary status limiting activity, decreased activity tolerance, decreased balance, decreased coordination, decreased endurance, decreased mobility, difficulty walking, decreased ROM, decreased strength, hypomobility, impaired flexibility, and pain.   ACTIVITY LIMITATIONS: carrying, lifting, bending, sitting, standing, squatting, sleeping, stairs, and transfers  PARTICIPATION LIMITATIONS: cleaning, laundry, shopping, community activity, and yard work  PERSONAL FACTORS: Age and 1-2 comorbidities: Arthritis, Breast cancer, TKA  are also affecting patient's functional outcome.   REHAB POTENTIAL: Good  CLINICAL DECISION MAKING: Stable/uncomplicated  EVALUATION COMPLEXITY: Low  PLAN:  PT FREQUENCY: 1-2x/week  PT DURATION: 12 weeks  PLANNED INTERVENTIONS: Therapeutic exercises, Therapeutic activity, Neuromuscular re-education, Balance training, Gait training, Patient/Family education, Self Care, Joint mobilization, Stair training, Vestibular training, Canalith repositioning, DME instructions, Dry Needling, Electrical stimulation, Spinal manipulation, Spinal mobilization, Cryotherapy, Moist heat, Manual therapy, and Re-evaluation  PLAN FOR NEXT SESSION balance and gait training.  Work on transfers to empower more use of Left LE. Incorporation of large amplitude movements when appropriate.   Norman Herrlich, PT 08/17/2023, 3:01 PM

## 2023-08-19 ENCOUNTER — Ambulatory Visit: Payer: Medicare Other | Admitting: Physical Therapy

## 2023-08-24 ENCOUNTER — Ambulatory Visit: Payer: Medicare Other | Admitting: Physical Therapy

## 2023-08-26 ENCOUNTER — Ambulatory Visit: Payer: Medicare Other | Admitting: Physical Therapy

## 2023-08-26 DIAGNOSIS — R2689 Other abnormalities of gait and mobility: Secondary | ICD-10-CM

## 2023-08-26 DIAGNOSIS — R2681 Unsteadiness on feet: Secondary | ICD-10-CM

## 2023-08-26 DIAGNOSIS — R262 Difficulty in walking, not elsewhere classified: Secondary | ICD-10-CM

## 2023-08-26 DIAGNOSIS — M6281 Muscle weakness (generalized): Secondary | ICD-10-CM

## 2023-08-26 DIAGNOSIS — R269 Unspecified abnormalities of gait and mobility: Secondary | ICD-10-CM

## 2023-08-26 NOTE — Therapy (Signed)
OUTPATIENT PHYSICAL THERAPY NEURO TREATMENT    Patient Name: Dorothy Mccoy MRN: [id number] DOB:[birth year], 72 y.o., female Today's Date: 08/26/2023   PCP: Dr. Manson Allan REFERRING PROVIDER: Dr. Cristopher Peru  END OF SESSION:  PT End of Session - 08/26/23 1452     Visit Number 18    Number of Visits 24    Date for PT Re-Evaluation 09/09/23    Progress Note Due on Visit 20    PT Start Time 1450    PT Stop Time 1528    PT Time Calculation (min) 38 min    Equipment Utilized During Treatment Gait belt    Activity Tolerance Patient tolerated treatment well    Behavior During Therapy WFL for tasks assessed/performed                       Past Medical History:  Diagnosis Date   Breast cancer (HCC)    bilateral   Chronic constipation    GERD (gastroesophageal reflux disease)    Osteoarthritis    Parkinson disease    Personal history of chemotherapy    Personal history of radiation therapy    PONV (postoperative nausea and vomiting)    Psoriatic arthritis (HCC)    Pulmonary embolism (HCC) 2000   following mastectomy   Past Surgical History:  Procedure Laterality Date   BREAST LUMPECTOMY Right 2005   CATARACT EXTRACTION Bilateral    COLONOSCOPY WITH PROPOFOL N/A 04/10/2022   Procedure: COLONOSCOPY WITH PROPOFOL;  Surgeon: Regis Bill, MD;  Location: [facility] ENDOSCOPY;  Service: Endoscopy;  Laterality: N/A;   INGUINAL HERNIA REPAIR Right 2018   MASTECTOMY Left 2000   MYOMECTOMY     TOTAL KNEE ARTHROPLASTY Left    Patient Active Problem List   Diagnosis Date Noted   Pain and swelling of left lower leg 01/25/2023   Arthralgia of multiple sites 01/26/2022   Asthenia 01/26/2022   Gastroesophageal reflux disease 01/26/2022   History of mastectomy 01/26/2022   History of myomectomy 01/26/2022   Malignant tumor of breast (HCC) 01/26/2022   Postoperative retention of urine 01/26/2022   History of pulmonary embolus (PE) 01/26/2022   Constipation  01/08/2022   History of colonic polyps 01/08/2022   History of total knee arthroplasty 06/19/2021   History of breast cancer in female 06/02/2021   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   REFERRING DIAG: Parkinson's disease  THERAPY DIAG:  No diagnosis found.  Rationale for Evaluation and Treatment: Rehabilitation  SUBJECTIVE:  SUBJECTIVE STATEMENT:  Pt reports she has been busy getting ready for some renovations for her brother.    Pt accompanied by: self  PERTINENT HISTORY: Patient is a 72 year old female with referral for Parkinsons disease. She was diagnosed in 2015 and per [name], PA report from visit on 01/20/2023: She reports her balance has been poor as of late. This is why she has started using a walker on a regular basis. She feels she cannot turn around fast and needs to hold on to something when she does. Last fall was around Christmastime. She states since then she has been especially cautious with her gait. She has also noticed some new dyskinesias over the past six-eight months. She does not feel it is worse at a specific time of day or with relation to medication timing. Her tremor is worst in her right foot, especially when she relaxes at night. She does feel like she is moving slowly overall. She notes all of  her symptoms are worse with anxiety and feeling rushed. Because of her balance and dyskinesias, she feels like she is worse overall than at her last appointment.  She has not noticed when her medication wears off. She does not typically notice when her medication kicks in, either.  She notes she is recovering from knee replacement surgery, and this is making her left leg stiff. Otherwise, she does not feel any stiffness. She also has psoriatic arthritis.  She reports feelings of depression and anxiety. She notes she lost her mother in 2022, just before she moved here. She was very close with her. She notes not being able to get out and about has also contributed.  She last had PT for her knee replacement, never for PD.    PAIN:  Yes, 7/10 pain in the Right knee   PRECAUTIONS: Fall  WEIGHT BEARING RESTRICTIONS: No  FALLS: Has patient fallen in last 6 months?  Last fall was Dec 2023 - while trying to decorate her Christmas tree.   LIVING ENVIRONMENT: Lives with: lives alone Lives in: House/apartment Stairs:  2 level home- basement (does not currently use) steps from garage- 3 with rail on right Has following equipment at home: Single point cane, Walker - 2 wheeled, and [employer] - 4 wheeled  PLOF: Independent  PATIENT GOALS: Improve my balance and loosen up my left knee  OBJECTIVE:   DIAGNOSTIC FINDINGS: EXAM: LEFT KNEE - COMPLETE 4+ VIEW   COMPARISON:  None Available.   FINDINGS: Total knee prosthesis observed, with proximally 1.5 mm lucency along the lateral tibial tray component on the frontal projection (frontal zones 3 and 4). Equivocal/minimal lucency along the methacrylate-bone interface along the femoral component in zone 5. The appearance suggests mild findings of loosening.   Small knee effusion noted.   IMPRESSION: 1. Mild lucency along the lateral tibial tray component (zones 3 and 4) and along the Methacrylate-bone interface along the femoral component  (lateral zone 5), suggesting mild loosening. 2. Small knee effusion.     Electronically Signed   By: Gaylyn Rong M.D.   On: 12/31/2022 17:16    COGNITION: Overall cognitive status: Within functional limits for tasks assessed   SENSATION: WFL  COORDINATION: Slow initiation of movement  EDEMA:  None present    POSTURE: rounded shoulders and forward head  LOWER EXTREMITY ROM:     Active  Right Eval Left Eval  Hip flexion    Hip extension    Hip abduction    Hip adduction  Hip internal rotation    Hip external rotation    Knee flexion 115 88  Knee extension    Ankle dorsiflexion    Ankle plantarflexion    Ankle inversion    Ankle eversion     (Blank rows = not tested)  LOWER EXTREMITY MMT:    MMT Right Eval Left Eval  Hip flexion 4 4  Hip extension 4 4  Hip abduction 4 4  Hip adduction 4 4  Hip internal rotation 4 4  Hip external rotation 4 4  Knee flexion 4 4  Knee extension 4 2-  Ankle dorsiflexion 4 4  Ankle plantarflexion    Ankle inversion    Ankle eversion    (Blank rows = not tested)  TRANSFERS: Assistive device utilized: Single point cane  Sit to stand: CGA Stand to sit: CGA Chair to chair: CGA Floor:  Not assessed   GAIT: Gait pattern: step through pattern, decreased arm swing- Right, decreased arm swing- Left, decreased step length- Right, and decreased step length- Left Distance walked: 730 Assistive device utilized: Single point cane Level of assistance: CGA Comments: decreased overall step length- no observed shuffling  FUNCTIONAL TESTS:  5 times sit to stand: 53.48 sec with UE support * Left knee pain Timed up and go (TUG): 25.52 with cane 6 minute walk test: 730 feet with cane 10 meter walk test: 0.62 m/s  PATIENT SURVEYS:  FOTO 49 with goal of 59  TODAY'S TREATMENT:       NMR                                                                                                                 Seated PWR! Moves x  10 ea of up, rock, and twist  Standing PWR! Rock and twist x 10 ea   PWR! Up works on postural strengthening and antigravity extension, PRW! Rock working on [employer] shifting, PRW! Twist targeting trunk rotation and PRW! Step targeting transition movements. PWR! Moves target bradykinesia, rigidity, and dyskinesia through targeted functional movements that address four core movement difficulties for people with Parkinson's disease.  Ambulation with metronome and with 4# AW x approx 550 ft x 2 rounds  -80 BPM throughout, seated rest between sets  TE  Seated hamstring curl with green Thera-Band resistance on left lower extremity and red Thera-Band resistance on right lower extremity.  No reports of knee pain with this activity 2 x 10 ea    Step over with 4# AW of 1/2 foam rollers and hurdles x several rounds, challenge with foot clearance   Therapeutic rest throughout as indicated.   PATIENT EDUCATION: Education details: Goals, Functional outcome measures, Purpose of PT Person educated: Patient Education method: Explanation Education comprehension: verbalized understanding  HOME EXERCISE PROGRAM: Access Code: [email or web address] URL: [email or web address] Date: 04/23/2023   -PWR! Seated exercises handout provided and reviewed each exercise GOALS: Goals reviewed with patient? Yes  SHORT TERM GOALS: Target date: 06/03/2023  Pt will be independent with HEP in order to improve strength and  balance in order to decrease fall risk and improve function at home and work.  Baseline: EVAL: No formal HEP in place Goal status: MET   LONG TERM GOALS: Target date: 07/15/2023  Pt will improve FOTO to target score of 59 to display perceived improvements in ability to complete ADL's.  Baseline: EVAL: 49  8/20:53.2 07/15/23: 62 Goal status: MET  2.  Pt will increase by at least 0.23 m/s in order to demonstrate clinically significant improvement in community ambulation.    Baseline: EVAL= 16.23 sec with cane or 0.62 m/s  8/20: .74 m/s 9/12: .9 m/s no AD Goal status: MET  3.  Pt will decrease TUG to below 17 seconds/decrease in order to demonstrate decreased fall risk. Baseline: EVAL: 25.52 sec 8/20:22.95 sec  9/12:18.5 sec Goal status: IN PROGRESS  4.  Pt will decrease 5TSTS by at least 20 seconds in order to demonstrate clinically significant improvement in LE strength.  Baseline: EVAL: 53.48 sec with B UE support 06/22/23: 31.27 sec 9/12:21.6 sec Goal status: IN PROGRESS  5.  Pt will increase by at least 54m (149ft) in order to demonstrate clinically significant improvement in cardiopulmonary endurance and community ambulation  Baseline: EVAL: 730 feet  8/20: 845 ft no AD  9/12: 1002 ft  Goal status: MET  6. Pt will demonstrate improved left knee AROM knee flex > 100 deg for improved functional mobility including transfers and walking.   Baseline: EVAL: 88 deg  8/20:90 degrees  07/15/23: 90 degrees  Goal status: NOT MET/ DISCONTINUED   7. Pt will improve FGA by at least 3 points in order to demonstrate clinically significant improvement in balance and decreased risk for falls.  Baseline: 04/29/2023=17/30  06/22/23: 17 9/12: 18 Goal status: ONGOING    8. Pt will improve BERG by at least 3 points in order to demonstrate clinically significant improvement in balance.  Baseline: 04/29/2023= 35/56  06/22/23: :44  Goal Status: MET   ASSESSMENT:  CLINICAL IMPRESSION:  Patient presents with good motivation for completion of physical therapy activities.  Patient progresses with ambulation with resistance and with metronome this date.  Patient has improved confidence and efficacy with ambulation following this and is able to walk in and out of therapy clinic compared to initially using transport chair.  Pt will continue to benefit from skilled physical therapy intervention to address impairments, improve QOL, and attain therapy goals.   OBJECTIVE  IMPAIRMENTS: Abnormal gait, cardiopulmonary status limiting activity, decreased activity tolerance, decreased balance, decreased coordination, decreased endurance, decreased mobility, difficulty walking, decreased ROM, decreased strength, hypomobility, impaired flexibility, and pain.   ACTIVITY LIMITATIONS: carrying, lifting, bending, sitting, standing, squatting, sleeping, stairs, and transfers  PARTICIPATION LIMITATIONS: cleaning, laundry, shopping, community activity, and yard work  PERSONAL FACTORS: Age and 1-2 comorbidities: Arthritis, Breast cancer, TKA  are also affecting patient's functional outcome.   REHAB POTENTIAL: Good  CLINICAL DECISION MAKING: Stable/uncomplicated  EVALUATION COMPLEXITY: Low  PLAN:  PT FREQUENCY: 1-2x/week  PT DURATION: 12 weeks  PLANNED INTERVENTIONS: Therapeutic exercises, Therapeutic activity, Neuromuscular re-education, Balance training, Gait training, Patient/Family education, Self Care, Joint mobilization, Stair training, Vestibular training, Canalith repositioning, DME instructions, Dry Needling, Electrical stimulation, Spinal manipulation, Spinal mobilization, Cryotherapy, Moist heat, Manual therapy, and Re-evaluation  PLAN FOR NEXT SESSION balance and gait training.  Work on transfers to empower more use of Left LE. Incorporation of large amplitude movements when appropriate.   Norman Herrlich, PT 08/26/2023, 2:53 PM

## 2023-08-31 ENCOUNTER — Ambulatory Visit: Payer: Medicare Other | Admitting: Physical Therapy

## 2023-08-31 DIAGNOSIS — R2681 Unsteadiness on feet: Secondary | ICD-10-CM

## 2023-08-31 DIAGNOSIS — R262 Difficulty in walking, not elsewhere classified: Secondary | ICD-10-CM

## 2023-08-31 DIAGNOSIS — M6281 Muscle weakness (generalized): Secondary | ICD-10-CM

## 2023-08-31 DIAGNOSIS — R269 Unspecified abnormalities of gait and mobility: Secondary | ICD-10-CM

## 2023-08-31 DIAGNOSIS — R2689 Other abnormalities of gait and mobility: Secondary | ICD-10-CM

## 2023-08-31 NOTE — Therapy (Unsigned)
OUTPATIENT PHYSICAL THERAPY NEURO TREATMENT    Patient Name: Dorothy Mccoy MRN: [id number] DOB:[birth year], 72 y.o., female Today's Date: 09/01/2023   PCP: Dr. Manson Allan REFERRING PROVIDER: Dr. Cristopher Peru  END OF SESSION:  PT End of Session - 09/01/23 0746     Visit Number 19    Number of Visits 24    Date for PT Re-Evaluation 09/09/23    Progress Note Due on Visit 20    PT Start Time 1457    PT Stop Time 1529    PT Time Calculation (min) 32 min    Equipment Utilized During Treatment Gait belt    Activity Tolerance Patient tolerated treatment well    Behavior During Therapy WFL for tasks assessed/performed                        Past Medical History:  Diagnosis Date   Breast cancer (HCC)    bilateral   Chronic constipation    GERD (gastroesophageal reflux disease)    Osteoarthritis    Parkinson disease    Personal history of chemotherapy    Personal history of radiation therapy    PONV (postoperative nausea and vomiting)    Psoriatic arthritis (HCC)    Pulmonary embolism (HCC) 2000   following mastectomy   Past Surgical History:  Procedure Laterality Date   BREAST LUMPECTOMY Right 2005   CATARACT EXTRACTION Bilateral    COLONOSCOPY WITH PROPOFOL N/A 04/10/2022   Procedure: COLONOSCOPY WITH PROPOFOL;  Surgeon: Regis Bill, MD;  Location: [facility] END[facility]COPY;  Service: Endoscopy;  Laterality: N/A;   INGUINAL HERNIA REPAIR Right 2018   MASTECTOMY Left 2000   MYOMECTOMY     TOTAL KNEE ARTHROPLASTY Left    Patient Active Problem List   Diagnosis Date Noted   Pain and swelling of left lower leg 01/25/2023   Arthralgia of multiple sites 01/26/2022   Asthenia 01/26/2022   Gastroesophageal reflux disease 01/26/2022   History of mastectomy 01/26/2022   History of myomectomy 01/26/2022   Malignant tumor of breast (HCC) 01/26/2022   Postoperative retention of urine 01/26/2022   History of pulmonary embolus (PE) 01/26/2022   Constipation  01/08/2022   History of colonic polyps 01/08/2022   History of total knee arthroplasty 06/19/2021   History of breast cancer in female 06/02/2021   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   REFERRING DIAG: Parkinson's disease  THERAPY DIAG:  Difficulty in walking, not elsewhere classified  Abnormality of gait and mobility  Other abnormalities of gait and mobility  Unsteadiness on feet  Muscle weakness (generalized)  Rationale for Evaluation and Treatment: Rehabilitation  SUBJECTIVE:  SUBJECTIVE STATEMENT:  Pt reports she has been busy getting ready for some renovations for her brother. Had another appointment today aid is apologetic for her tardiness.    Pt accompanied by: self  PERTINENT HISTORY: Patient is a 72 year old female with referral for Parkinsons disease. She was diagnosed in 2015 and per [name], PA report from visit on 01/20/2023: She reports her balance has been poor as of late. This is why she has started using a walker on a regular basis. She feels she cannot turn around fast and needs to hold on to something when she does. Last fall was around Christmastime. She states since then she has been especially cautious with her gait. She has also noticed some new dyskinesias over the past six-eight months. She  does not feel it is worse at a specific time of day or with relation to medication timing. Her tremor is worst in her right foot, especially when she relaxes at night. She does feel like she is moving slowly overall. She notes all of her symptoms are worse with anxiety and feeling rushed. Because of her balance and dyskinesias, she feels like she is worse overall than at her last appointment.  She has not noticed when her medication wears off. She does not typically notice when her medication kicks in, either.  She notes she is recovering from knee replacement surgery, and this is making her left leg stiff. Otherwise, she does not feel any stiffness. She also has psoriatic arthritis.  She reports feelings of depression and anxiety. She notes she lost her mother in 2022, just before she moved here. She was very close with her. She notes not being able to get out and about has also contributed.  She last had PT for her knee replacement, never for PD.    PAIN:  Yes, 7/10 pain in the Right knee   PRECAUTIONS: Fall  WEIGHT BEARING RESTRICTIONS: No  FALLS: Has patient fallen in last 6 months?  Last fall was Dec 2023 - while trying to decorate her Christmas tree.   LIVING ENVIRONMENT: Lives with: lives alone Lives in: House/apartment Stairs:  2 level home- basement (does not currently use) steps from garage- 3 with rail on right Has following equipment at home: Single point cane, Walker - 2 wheeled, and [employer] - 4 wheeled  PLOF: Independent  PATIENT GOALS: Improve my balance and loosen up my left knee  OBJECTIVE:   DIAGNOSTIC FINDINGS: EXAM: LEFT KNEE - COMPLETE 4+ VIEW   COMPARISON:  None Available.   FINDINGS: Total knee prosthesis observed, with proximally 1.5 mm lucency along the lateral tibial tray component on the frontal projection (frontal zones 3 and 4). Equivocal/minimal lucency along the methacrylate-bone interface along the femoral component in zone 5. The appearance  suggests mild findings of loosening.   Small knee effusion noted.   IMPRESSION: 1. Mild lucency along the lateral tibial tray component (zones 3 and 4) and along the Methacrylate-bone interface along the femoral component (lateral zone 5), suggesting mild loosening. 2. Small knee effusion.     Electronically Signed   By: Gaylyn Rong M.D.   On: 12/31/2022 17:16    COGNITION: Overall cognitive status: Within functional limits for tasks assessed   SENSATION: WFL  COORDINATION: Slow initiation of movement  EDEMA:  None present    POSTURE: rounded shoulders and forward head  LOWER EXTREMITY ROM:     Active  Right Eval Left Eval  Hip flexion    Hip extension  Hip abduction    Hip adduction    Hip internal rotation    Hip external rotation    Knee flexion 115 88  Knee extension    Ankle dorsiflexion    Ankle plantarflexion    Ankle inversion    Ankle eversion     (Blank rows = not tested)  LOWER EXTREMITY MMT:    MMT Right Eval Left Eval  Hip flexion 4 4  Hip extension 4 4  Hip abduction 4 4  Hip adduction 4 4  Hip internal rotation 4 4  Hip external rotation 4 4  Knee flexion 4 4  Knee extension 4 2-  Ankle dorsiflexion 4 4  Ankle plantarflexion    Ankle inversion    Ankle eversion    (Blank rows = not tested)  TRANSFERS: Assistive device utilized: Single point cane  Sit to stand: CGA Stand to sit: CGA Chair to chair: CGA Floor:  Not assessed   GAIT: Gait pattern: step through pattern, decreased arm swing- Right, decreased arm swing- Left, decreased step length- Right, and decreased step length- Left Distance walked: 730 Assistive device utilized: Single point cane Level of assistance: CGA Comments: decreased overall step length- no observed shuffling  FUNCTIONAL TESTS:  5 times sit to stand: 53.48 sec with UE support * Left knee pain Timed up and go (TUG): 25.52 with cane 6 minute walk test: 730 feet with cane 10 meter walk  test: 0.62 m/s  PATIENT SURVEYS:  FOTO 49 with goal of 59  TODAY'S TREATMENT:       NMR                                                                                                                  Pt session was cut a little short today due to pt arriving late to scheduled appointment time  Seated PWR! Moves x 10 ea of up, rock, and twist  PWR! Up works on postural strengthening and antigravity extension, PRW! Rock working on [employer] shifting, PRW! Twist targeting trunk rotation and PRW! Step targeting transition movements. PWR! Moves target bradykinesia, rigidity, and dyskinesia through targeted functional movements that address four core movement difficulties for people with Parkinson's disease.  Ambulation with metronome and with 4# AW x multiple planes of motion x approx 5 min duration, challenged with side stepping especially -80 BPM throughout, seated rest between sets  TE  Seated hamstring curl with green Thera-Band resistance on left lower extremity and red Thera-Band resistance on right lower extremity.  No reports of knee pain with this activity 2 x 10 ea    Step over with 4# AW of 1/2 foam rollers and hurdles x several rounds, challenge with foot clearance  2*12 reps   Therapeutic rest throughout as indicated.   PATIENT EDUCATION: Education details: Goals, Functional outcome measures, Purpose of PT Person educated: Patient Education method: Explanation Education comprehension: verbalized understanding  HOME EXERCISE PROGRAM: Access Code: [email or web address] URL: [email or web address] Date: 04/23/2023   -PWR! Seated exercises handout provided and  reviewed each exercise GOALS: Goals reviewed with patient? Yes  SHORT TERM GOALS: Target date: 06/03/2023  Pt will be independent with HEP in order to improve strength and balance in order to decrease fall risk and improve function at home and work.  Baseline: EVAL: No formal HEP in place Goal  status: MET   LONG TERM GOALS: Target date: 07/15/2023  Pt will improve FOTO to target score of 59 to display perceived improvements in ability to complete ADL's.  Baseline: EVAL: 49  8/20:53.2 07/15/23: 62 Goal status: MET  2.  Pt will increase by at least 0.23 m/s in order to demonstrate clinically significant improvement in community ambulation.   Baseline: EVAL= 16.23 sec with cane or 0.62 m/s  8/20: .74 m/s 9/12: .9 m/s no AD Goal status: MET  3.  Pt will decrease TUG to below 17 seconds/decrease in order to demonstrate decreased fall risk. Baseline: EVAL: 25.52 sec 8/20:22.95 sec  9/12:18.5 sec Goal status: IN PROGRESS  4.  Pt will decrease 5TSTS by at least 20 seconds in order to demonstrate clinically significant improvement in LE strength.  Baseline: EVAL: 53.48 sec with B UE support 06/22/23: 31.27 sec 9/12:21.6 sec Goal status: IN PROGRESS  5.  Pt will increase by at least 14m (117ft) in order to demonstrate clinically significant improvement in cardiopulmonary endurance and community ambulation  Baseline: EVAL: 730 feet  8/20: 845 ft no AD  9/12: 1002 ft  Goal status: MET  6. Pt will demonstrate improved left knee AROM knee flex > 100 deg for improved functional mobility including transfers and walking.   Baseline: EVAL: 88 deg  8/20:90 degrees  07/15/23: 90 degrees  Goal status: NOT MET/ DISCONTINUED   7. Pt will improve FGA by at least 3 points in order to demonstrate clinically significant improvement in balance and decreased risk for falls.  Baseline: 04/29/2023=17/30  06/22/23: 17 9/12: 18 Goal status: ONGOING    8. Pt will improve BERG by at least 3 points in order to demonstrate clinically significant improvement in balance.  Baseline: 04/29/2023= 35/56  06/22/23: :44  Goal Status: MET   ASSESSMENT:  CLINICAL IMPRESSION:  Patient presents with good motivation for completion of physical therapy activities.  Patient progresses with ambulation  with different planes of motion and with metronome this date.  Patient has improved confidence and efficacy with ambulation following this and is able to walk in and out of therapy clinic compared to initially using transport chair.  Pt will continue to benefit from skilled physical therapy intervention to address impairments, improve QOL, and attain therapy goals.   OBJECTIVE IMPAIRMENTS: Abnormal gait, cardiopulmonary status limiting activity, decreased activity tolerance, decreased balance, decreased coordination, decreased endurance, decreased mobility, difficulty walking, decreased ROM, decreased strength, hypomobility, impaired flexibility, and pain.   ACTIVITY LIMITATIONS: carrying, lifting, bending, sitting, standing, squatting, sleeping, stairs, and transfers  PARTICIPATION LIMITATIONS: cleaning, laundry, shopping, community activity, and yard work  PERSONAL FACTORS: Age and 1-2 comorbidities: Arthritis, Breast cancer, TKA  are also affecting patient's functional outcome.   REHAB POTENTIAL: Good  CLINICAL DECISION MAKING: Stable/uncomplicated  EVALUATION COMPLEXITY: Low  PLAN:  PT FREQUENCY: 1-2x/week  PT DURATION: 12 weeks  PLANNED INTERVENTIONS: Therapeutic exercises, Therapeutic activity, Neuromuscular re-education, Balance training, Gait training, Patient/Family education, Self Care, Joint mobilization, Stair training, Vestibular training, Canalith repositioning, DME instructions, Dry Needling, Electrical stimulation, Spinal manipulation, Spinal mobilization, Cryotherapy, Moist heat, Manual therapy, and Re-evaluation  PLAN FOR NEXT SESSION balance and gait training.  Work on  transfers to empower more use of Left LE. Incorporation of large amplitude movements when appropriate.   Norman Herrlich, PT 09/01/2023, 7:46 AM

## 2023-09-02 ENCOUNTER — Ambulatory Visit: Payer: Medicare Other | Admitting: Physical Therapy

## 2023-09-02 ENCOUNTER — Encounter: Payer: Self-pay | Admitting: Physical Therapy

## 2023-09-02 DIAGNOSIS — R262 Difficulty in walking, not elsewhere classified: Secondary | ICD-10-CM

## 2023-09-02 DIAGNOSIS — R2689 Other abnormalities of gait and mobility: Secondary | ICD-10-CM

## 2023-09-02 DIAGNOSIS — M6281 Muscle weakness (generalized): Secondary | ICD-10-CM

## 2023-09-02 DIAGNOSIS — R269 Unspecified abnormalities of gait and mobility: Secondary | ICD-10-CM

## 2023-09-02 DIAGNOSIS — R2681 Unsteadiness on feet: Secondary | ICD-10-CM

## 2023-09-02 NOTE — Therapy (Signed)
OUTPATIENT PHYSICAL THERAPY NEURO TREATMENT/ Physical Therapy Progress Note   Dates of reporting period  06/24/23   to   09/02/23     Patient Name: Dorothy Mccoy MRN: [id number] DOB:1951-09-02, 72 y.o., female Today's Date: 09/02/2023   PCP: Dr. Manson Allan REFERRING PROVIDER: Dr. Cristopher Peru  END OF SESSION:  PT End of Session - 09/02/23 1658     Visit Number 20    Number of Visits 24    Date for PT Re-Evaluation 09/09/23    Progress Note Due on Visit 20    PT Start Time 1446    PT Stop Time 1529    PT Time Calculation (min) 43 min    Equipment Utilized During Treatment Gait belt    Activity Tolerance Patient tolerated treatment well    Behavior During Therapy WFL for tasks assessed/performed                         Past Medical History:  Diagnosis Date   Breast cancer (HCC)    bilateral   Chronic constipation    GERD (gastroesophageal reflux disease)    Osteoarthritis    Parkinson disease (HCC)    Personal history of chemotherapy    Personal history of radiation therapy    PONV (postoperative nausea and vomiting)    Psoriatic arthritis (HCC)    Pulmonary embolism (HCC) 2000   following mastectomy   Past Surgical History:  Procedure Laterality Date   BREAST LUMPECTOMY Right 2005   CATARACT EXTRACTION Bilateral    COLONOSCOPY WITH PROPOFOL N/A 04/10/2022   Procedure: COLONOSCOPY WITH PROPOFOL;  Surgeon: Regis Bill, MD;  Location: [facility] ENDOSCOPY;  Service: Endoscopy;  Laterality: N/A;   INGUINAL HERNIA REPAIR Right 2018   MASTECTOMY Left 2000   MYOMECTOMY     TOTAL KNEE ARTHROPLASTY Left    Patient Active Problem List   Diagnosis Date Noted   Pain and swelling of left lower leg 01/25/2023   Arthralgia of multiple sites 01/26/2022   Asthenia 01/26/2022   Gastroesophageal reflux disease 01/26/2022   History of mastectomy 01/26/2022   History of myomectomy 01/26/2022   Malignant tumor of breast (HCC) 01/26/2022   Postoperative  retention of urine 01/26/2022   History of pulmonary embolus (PE) 01/26/2022   Constipation 01/08/2022   History of colonic polyps 01/08/2022   History of total knee arthroplasty 06/19/2021   History of breast cancer in female 06/02/2021   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   REFERRING DIAG: Parkinson's disease  THERAPY DIAG:  Difficulty in walking, not elsewhere classified  Abnormality of gait and mobility  Other abnormalities of gait and mobility  Unsteadiness on feet  Muscle weakness (generalized)  Rationale for Evaluation and Treatment: Rehabilitation  SUBJECTIVE:  SUBJECTIVE STATEMENT:  Pt reports knee pain on the R side this date. Pt reptes R Knee pain 7.5/10 this date.    Pt accompanied by: self  PERTINENT HISTORY: Patient is a 72 year old female with referral for Parkinsons disease. She was diagnosed in 2015 and per [name], PA report from visit on 01/20/2023: She reports her balance has been poor as of late. This is why she has started using a walker on a regular basis. She feels she cannot turn around fast and needs to hold on to something when she does. Last fall was around Christmastime. She states since then she has been especially cautious with her gait. She has also noticed some new dyskinesias over  the past six-eight months. She does not feel it is worse at a specific time of day or with relation to medication timing. Her tremor is worst in her right foot, especially when she relaxes at night. She does feel like she is moving slowly overall. She notes all of her symptoms are worse with anxiety and feeling rushed. Because of her balance and dyskinesias, she feels like she is worse overall than at her last appointment.  She has not noticed when her medication wears off. She does not typically notice when her medication kicks in, either.  She notes she is recovering from knee replacement surgery, and this is making her left leg stiff. Otherwise, she does not feel any stiffness. She also has psoriatic arthritis.  She reports feelings of depression and anxiety. She notes she lost her mother in 2022, just before she moved here. She was very close with her. She notes not being able to get out and about has also contributed.  She last had PT for her knee replacement, never for PD.    PAIN:  Yes, 7/10 pain in the Right knee   PRECAUTIONS: Fall  WEIGHT BEARING RESTRICTIONS: No  FALLS: Has patient fallen in last 6 months?  Last fall was Dec 2023 - while trying to decorate her Christmas tree.   LIVING ENVIRONMENT: Lives with: lives alone Lives in: House/apartment Stairs:  2 level home- basement (does not currently use) steps from garage- 3 with rail on right Has following equipment at home: Single point cane, Walker - 2 wheeled, and [employer] - 4 wheeled  PLOF: Independent  PATIENT GOALS: Improve my balance and loosen up my left knee  OBJECTIVE:   DIAGNOSTIC FINDINGS: EXAM: LEFT KNEE - COMPLETE 4+ VIEW   COMPARISON:  None Available.   FINDINGS: Total knee prosthesis observed, with proximally 1.5 mm lucency along the lateral tibial tray component on the frontal projection (frontal zones 3 and 4). Equivocal/minimal lucency along the methacrylate-bone interface along the femoral  component in zone 5. The appearance suggests mild findings of loosening.   Small knee effusion noted.   IMPRESSION: 1. Mild lucency along the lateral tibial tray component (zones 3 and 4) and along the Methacrylate-bone interface along the femoral component (lateral zone 5), suggesting mild loosening. 2. Small knee effusion.     Electronically Signed   By: Gaylyn Rong M.D.   On: 12/31/2022 17:16    COGNITION: Overall cognitive status: Within functional limits for tasks assessed   SENSATION: WFL  COORDINATION: Slow initiation of movement  EDEMA:  None present    POSTURE: rounded shoulders and forward head  LOWER EXTREMITY ROM:     Active  Right Eval Left Eval  Hip flexion    Hip extension    Hip abduction  Hip adduction    Hip internal rotation    Hip external rotation    Knee flexion 115 88  Knee extension    Ankle dorsiflexion    Ankle plantarflexion    Ankle inversion    Ankle eversion     (Blank rows = not tested)  LOWER EXTREMITY MMT:    MMT Right Eval Left Eval  Hip flexion 4 4  Hip extension 4 4  Hip abduction 4 4  Hip adduction 4 4  Hip internal rotation 4 4  Hip external rotation 4 4  Knee flexion 4 4  Knee extension 4 2-  Ankle dorsiflexion 4 4  Ankle plantarflexion    Ankle inversion    Ankle eversion    (Blank rows = not tested)  TRANSFERS: Assistive device utilized: Single point cane  Sit to stand: CGA Stand to sit: CGA Chair to chair: CGA Floor:  Not assessed   GAIT: Gait pattern: step through pattern, decreased arm swing- Right, decreased arm swing- Left, decreased step length- Right, and decreased step length- Left Distance walked: 730 Assistive device utilized: Single point cane Level of assistance: CGA Comments: decreased overall step length- no observed shuffling  FUNCTIONAL TESTS:  5 times sit to stand: 53.48 sec with UE support * Left knee pain Timed up and go (TUG): 25.52 with cane 6 minute walk  test: 730 feet with cane 10 meter walk test: 0.62 m/s  PATIENT SURVEYS:  FOTO 49 with goal of 59  TODAY'S TREATMENT:      Physical therapy treatment session today consisted of completing assessment of goals and administration of testing as demonstrated and documented in flow sheet, treatment, and goals section of this note. Addition treatments may be found below.    [facility] PT Assessment - 09/02/23 0001       Standardized Balance Assessment   Standardized Balance Assessment Mini-BESTest      Mini-BESTest   Sit To Stand Moderate: Comes to stand WITH use of hands on first attempt.    Rise to Toes Moderate: Heels up, but not full range (smaller than when holding hands), OR noticeable instability for 3 s.    Stand on one leg (left) Normal: 20 s.    Stand on one leg (right) Moderate: < 20 s    Stand on one leg - lowest score 1    Compensatory Stepping Correction - Forward Moderate: More than one step is required to recover equilibrium    Compensatory Stepping Correction - Backward Moderate: More than one step is required to recover equilibrium    Compensatory Stepping Correction - Left Lateral Moderate: Several steps to recover equilibrium    Compensatory Stepping Correction - Right Lateral Normal: Recovers independently with 1 step (crossover or lateral OK)    Stepping Corredtion Lateral - lowest score 1    Stance - Feet together, eyes open, firm surface  Normal: 30s    Stance - Feet together, eyes closed, foam surface  Normal: 30s    Incline - Eyes Closed Moderate: Stands independently < 30s OR aligns with surface    Change in Gait Speed Moderate: Unable to change walking speed or signs of imbalance    Walk with head turns - Horizontal Moderate: performs head turns with reduction in gait speed.    Walk with pivot turns Moderate:Turns with feet close SLOW (>4 steps) with good balance.    Step over obstacles Moderate: Steps over box but touches box OR displays cautious behavior by slowing  gait.  Timed UP & GO with Dual Task Severe: Stops counting while walking OR stops walking while counting.   17.59 normal tug  Dual task: 69 sec   Mini-BEST total score 15      Functional Gait  Assessment   Gait Level Surface Walks 20 ft in less than 5.5 sec, no assistive devices, good speed, no evidence for imbalance, normal gait pattern, deviates no more than 6 in outside of the 12 in walkway width.    Change in Gait Speed Able to change speed, demonstrates mild gait deviations, deviates 6-10 in outside of the 12 in walkway width, or no gait deviations, unable to achieve a major change in velocity, or uses a change in velocity, or uses an assistive device.    Gait with Horizontal Head Turns Performs head turns smoothly with slight change in gait velocity (eg, minor disruption to smooth gait path), deviates 6-10 in outside 12 in walkway width, or uses an assistive device.    Gait with Vertical Head Turns Performs task with slight change in gait velocity (eg, minor disruption to smooth gait path), deviates 6 - 10 in outside 12 in walkway width or uses assistive device    Gait and Pivot Turn Turns slowly, requires verbal cueing, or requires several small steps to catch balance following turn and stop    Step Over Obstacle Is able to step over one shoe box (4.5 in total height) but must slow down and adjust steps to clear box safely. May require verbal cueing.    Gait with Narrow Base of Support Is able to ambulate for 10 steps heel to toe with no staggering.    Gait with Eyes Closed Walks 20 ft, uses assistive device, slower speed, mild gait deviations, deviates 6-10 in outside 12 in walkway width. Ambulates 20 ft in less than 9 sec but greater than 7 sec.    Ambulating Backwards Walks 20 ft, uses assistive device, slower speed, mild gait deviations, deviates 6-10 in outside 12 in walkway width.    Steps Alternating feet, must use rail.    Total Score 20                                                                                                                      PATIENT EDUCATION: Education details: Goals, Functional outcome measures, Purpose of PT Person educated: Patient Education method: Explanation Education comprehension: verbalized understanding  HOME EXERCISE PROGRAM: Access Code: [email or web address] URL: [email or web address] Date: 04/23/2023   -PWR! Seated exercises handout provided and reviewed each exercise GOALS: Goals reviewed with patient? Yes  SHORT TERM GOALS: Target date: 06/03/2023  Pt will be independent with HEP in order to improve strength and balance in order to decrease fall risk and improve function at home and work.  Baseline: EVAL: No formal HEP in place Goal status: MET   LONG TERM GOALS: Target date: 07/15/2023  Pt will improve FOTO to target score of 59 to display perceived improvements in ability to complete ADL's.  Baseline:  EVAL: 49  8/20:53.2 07/15/23: 62 Goal status: MET  2.  Pt will increase by at least 0.23 m/s in order to demonstrate clinically significant improvement in community ambulation.   Baseline: EVAL= 16.23 sec with cane or 0.62 m/s  8/20: .74 m/s 9/12: .9 m/s no AD Goal status: MET  3.  Pt will decrease TUG to below 17 seconds/decrease in order to demonstrate decreased fall risk. Baseline: EVAL: 25.52 sec 8/20:22.95 sec  9/12:18.5 sec 10/31: 17.59 sec  Goal status: IN PROGRESS  4.  Pt will decrease 5TSTS by at least 20 seconds in order to demonstrate clinically significant improvement in LE strength.  Baseline: EVAL: 53.48 sec with B UE support 06/22/23: 31.27 sec 9/12:21.6 sec 10/31: deferred secondary to R knee pain  Goal status: IN PROGRESS  5.  Pt will increase by at least 79m (141ft) in order to demonstrate clinically significant improvement in cardiopulmonary endurance and community ambulation  Baseline: EVAL: 730 feet  8/20: 845 ft no AD  9/12: 1002 ft  Goal status: MET  6. Pt will  demonstrate improved left knee AROM knee flex > 100 deg for improved functional mobility including transfers and walking.   Baseline: EVAL: 88 deg  8/20:90 degrees  07/15/23: 90 degrees  Goal status: NOT MET/ DISCONTINUED   7. Pt will improve FGA by at least 3 points in order to demonstrate clinically significant improvement in balance and decreased risk for falls.  Baseline: 04/29/2023=17/30  06/22/23: 17 9/12: 18 10/31:20/30 Goal status: ONGOING    8. Pt will improve BERG by at least 3 points in order to demonstrate clinically significant improvement in balance.  Baseline: 04/29/2023= 35/56  06/22/23: :44  Goal Status: MET   9.  Patient will improve many best balance test by 4 points or greater in order to indicate a clinically significant improvement in their balance as well as a decrease in risk of falls Baseline: 15 Goal status: INITIAL   ASSESSMENT:  CLINICAL IMPRESSION:  Patient presents to physical therapy for progress note this date.  Patient shows significant progress towards her long-term goals meeting her functional gait assessment goal.  Patient still does show balance deficits with several aspects of her balance including reactive postural control and dynamic gait as evidenced by portions of the many best balance test.  In addition patient's cognitive dual task with the mini best with significantly impaired and may be good target for future therapeutic interventions as patient continues to progress.  Patient is somewhat limited by her knee pain on her right side which is getting evaluated by a doctor this week.  Patient will report back to physical therapy regarding treatment plan for her right knee. Patient's condition has the potential to improve in response to therapy. Maximum improvement is yet to be obtained. The anticipated improvement is attainable and reasonable in a generally predictable time.     OBJECTIVE IMPAIRMENTS: Abnormal gait, cardiopulmonary status limiting  activity, decreased activity tolerance, decreased balance, decreased coordination, decreased endurance, decreased mobility, difficulty walking, decreased ROM, decreased strength, hypomobility, impaired flexibility, and pain.   ACTIVITY LIMITATIONS: carrying, lifting, bending, sitting, standing, squatting, sleeping, stairs, and transfers  PARTICIPATION LIMITATIONS: cleaning, laundry, shopping, community activity, and yard work  PERSONAL FACTORS: Age and 1-2 comorbidities: Arthritis, Breast cancer, TKA  are also affecting patient's functional outcome.   REHAB POTENTIAL: Good  CLINICAL DECISION MAKING: Stable/uncomplicated  EVALUATION COMPLEXITY: Low  PLAN:  PT FREQUENCY: 1-2x/week  PT DURATION: 12 weeks  PLANNED INTERVENTIONS: Therapeutic exercises,  Therapeutic activity, Neuromuscular re-education, Balance training, Gait training, Patient/Family education, Self Care, Joint mobilization, Stair training, Vestibular training, Canalith repositioning, DME instructions, Dry Needling, Electrical stimulation, Spinal manipulation, Spinal mobilization, Cryotherapy, Moist heat, Manual therapy, and Re-evaluation  PLAN FOR NEXT SESSION balance and gait training.  Dual task balance with cognitive and motor dual tasks   Norman Herrlich, PT 09/02/2023, 4:59 PM

## 2023-09-07 ENCOUNTER — Ambulatory Visit: Payer: Medicare Other | Admitting: Physical Therapy

## 2023-09-07 NOTE — Therapy (Deleted)
OUTPATIENT PHYSICAL THERAPY NEURO TREATMENT/ RECERT ***      Patient Name: Dorothy Mccoy MRN: [id number] DOB:[birth year], 72 y.o., female Today's Date: 09/07/2023   PCP: Dr. Manson Allan REFERRING PROVIDER: Dr. Cristopher Peru  END OF SESSION:                Past Medical History:  Diagnosis Date   Breast cancer [facility])    bilateral   Chronic constipation    GERD (gastroesophageal reflux disease)    Osteoarthritis    Parkinson disease (HCC)    Personal history of chemotherapy    Personal history of radiation therapy    PONV (postoperative nausea and vomiting)    Psoriatic arthritis (HCC)    Pulmonary embolism (HCC) 2000   following mastectomy   Past Surgical History:  Procedure Laterality Date   BREAST LUMPECTOMY Right 2005   CATARACT EXTRACTION Bilateral    COLONOSCOPY WITH PROPOFOL N/A 04/10/2022   Procedure: COLONOSCOPY WITH PROPOFOL;  Surgeon: Regis Bill, MD;  Location: [facility] ENDOSCOPY;  Service: Endoscopy;  Laterality: N/A;   INGUINAL HERNIA REPAIR Right 2018   MASTECTOMY Left 2000   MYOMECTOMY     TOTAL KNEE ARTHROPLASTY Left    Patient Active Problem List   Diagnosis Date Noted   Pain and swelling of left lower leg 01/25/2023   Arthralgia of multiple sites 01/26/2022   Asthenia 01/26/2022   Gastroesophageal reflux disease 01/26/2022   History of mastectomy 01/26/2022   History of myomectomy 01/26/2022   Malignant tumor of breast (HCC) 01/26/2022   Postoperative retention of urine 01/26/2022   History of pulmonary embolus (PE) 01/26/2022   Constipation 01/08/2022   History of colonic polyps 01/08/2022   History of total knee arthroplasty 06/19/2021   History of breast cancer in female 06/02/2021   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   REFERRING DIAG: Parkinson's disease  THERAPY DIAG:  Difficulty in walking, not elsewhere classified  Abnormality of gait and mobility  Other abnormalities of gait and mobility  Unsteadiness on feet  Muscle weakness (generalized)  Rationale for Evaluation and Treatment: Rehabilitation  SUBJECTIVE:  SUBJECTIVE STATEMENT:  Pt reports knee pain on the R side this date. Pt reptes R Knee pain 7.5/10 this date.    Pt accompanied by: self  PERTINENT HISTORY: Patient is a 72 year old female with referral for Parkinsons disease. She was diagnosed in 2015 and per [name], PA report from visit on 01/20/2023: She reports her balance has been poor as of late. This is why she has started using a walker on a regular basis. She feels she cannot turn around fast and needs to hold on to something when she does. Last fall was around Christmastime. She states since then she has been especially cautious with her gait. She has also noticed some new dyskinesias over the past six-eight months. She does not feel it is worse at a specific time of day or with relation to medication timing. Her tremor is worst in her right foot, especially when she relaxes at night. She does feel like she is moving slowly overall. She notes all of her symptoms are worse with anxiety and feeling rushed. Because of her balance and dyskinesias, she feels like she is worse overall than at her last appointment.  She has not noticed when her medication wears off. She does not  typically notice when her medication kicks in, either.  She notes she is recovering from knee replacement surgery, and this is making her left leg stiff. Otherwise, she does not feel any stiffness. She also has psoriatic arthritis.  She reports feelings of depression and anxiety. She notes she lost her mother in 2022, just before she moved here. She was very close with her. She notes not being able to get out and about has also contributed.  She last had PT for her knee replacement, never for PD.    PAIN:  Yes, 7/10 pain in the Right knee   PRECAUTIONS: Fall  WEIGHT BEARING RESTRICTIONS: No  FALLS: Has patient fallen in last 6 months?  Last fall was Dec 2023 - while trying to decorate her Christmas tree.   LIVING ENVIRONMENT: Lives with: lives alone Lives in: House/apartment Stairs:  2 level home- basement (does not currently use) steps from garage- 3 with rail on right Has following equipment at home: Single point cane, Walker - 2 wheeled, and [employer] - 4 wheeled  PLOF: Independent  PATIENT GOALS: Improve my balance and loosen up my left knee  OBJECTIVE:   DIAGNOSTIC FINDINGS: EXAM: LEFT KNEE - COMPLETE 4+ VIEW   COMPARISON:  None Available.   FINDINGS: Total knee prosthesis observed, with proximally 1.5 mm lucency along the lateral tibial tray component on the frontal projection (frontal zones 3 and 4). Equivocal/minimal lucency along the methacrylate-bone interface along the femoral component in zone 5. The appearance suggests mild findings of loosening.   Small knee effusion noted.   IMPRESSION: 1. Mild lucency along the lateral tibial tray component (zones 3 and 4) and along the Methacrylate-bone interface along the femoral component (lateral zone 5), suggesting mild loosening. 2. Small knee effusion.     Electronically Signed   By: Gaylyn Rong M.D.   On: 12/31/2022 17:16    COGNITION: Overall cognitive status: Within functional limits for tasks  assessed   SENSATION: WFL  COORDINATION: Slow initiation of movement  EDEMA:  None present    POSTURE: rounded shoulders and forward head  LOWER EXTREMITY ROM:     Active  Right Eval Left Eval  Hip flexion    Hip extension    Hip abduction  Hip adduction    Hip internal rotation    Hip external rotation    Knee flexion 115 88  Knee extension    Ankle dorsiflexion    Ankle plantarflexion    Ankle inversion    Ankle eversion     (Blank rows = not tested)  LOWER EXTREMITY MMT:    MMT Right Eval Left Eval  Hip flexion 4 4  Hip extension 4 4  Hip abduction 4 4  Hip adduction 4 4  Hip internal rotation 4 4  Hip external rotation 4 4  Knee flexion 4 4  Knee extension 4 2-  Ankle dorsiflexion 4 4  Ankle plantarflexion    Ankle inversion    Ankle eversion    (Blank rows = not tested)  TRANSFERS: Assistive device utilized: Single point cane  Sit to stand: CGA Stand to sit: CGA Chair to chair: CGA Floor:  Not assessed   GAIT: Gait pattern: step through pattern, decreased arm swing- Right, decreased arm swing- Left, decreased step length- Right, and decreased step length- Left Distance walked: 730 Assistive device utilized: Single point cane Level of assistance: CGA Comments: decreased overall step length- no observed shuffling  FUNCTIONAL TESTS:  5 times sit to stand: 53.48 sec with UE support * Left knee pain Timed up and go (TUG): 25.52 with cane 6 minute walk test: 730 feet with cane 10 meter walk test: 0.62 m/s  PATIENT SURVEYS:  FOTO 49 with goal of 59  TODAY'S TREATMENT:      Physical therapy treatment session today consisted of completing assessment of goals and administration of testing as demonstrated and documented in flow sheet, treatment, and goals section of this note. Addition treatments may be found below.    [facility] PT Assessment - from 09/02/23 PT visit       Standardized Balance Assessment   Standardized Balance Assessment  Mini-BESTest      Mini-BESTest   Sit To Stand Moderate: Comes to stand WITH use of hands on first attempt.    Rise to Toes Moderate: Heels up, but not full range (smaller than when holding hands), OR noticeable instability for 3 s.    Stand on one leg (left) Normal: 20 s.    Stand on one leg (right) Moderate: < 20 s    Stand on one leg - lowest score 1    Compensatory Stepping Correction - Forward Moderate: More than one step is required to recover equilibrium    Compensatory Stepping Correction - Backward Moderate: More than one step is required to recover equilibrium    Compensatory Stepping Correction - Left Lateral Moderate: Several steps to recover equilibrium    Compensatory Stepping Correction - Right Lateral Normal: Recovers independently with 1 step (crossover or lateral OK)    Stepping Corredtion Lateral - lowest score 1    Stance - Feet together, eyes open, firm surface  Normal: 30s    Stance - Feet together, eyes closed, foam surface  Normal: 30s    Incline - Eyes Closed Moderate: Stands independently < 30s OR aligns with surface    Change in Gait Speed Moderate: Unable to change walking speed or signs of imbalance    Walk with head turns - Horizontal Moderate: performs head turns with reduction in gait speed.    Walk with pivot turns Moderate:Turns with feet close SLOW (>4 steps) with good balance.    Step over obstacles Moderate: Steps over box but touches box OR displays cautious behavior by slowing gait.  Timed UP & GO with Dual Task Severe: Stops counting while walking OR stops walking while counting.   17.59 normal tug  Dual task: 69 sec   Mini-BEST total score 15                                                                                                             PATIENT EDUCATION: Education details: Goals, Functional outcome measures, Purpose of PT Person educated: Patient Education method: Explanation Education comprehension: verbalized  understanding  HOME EXERCISE PROGRAM: Access Code: [email or web address] URL: [email or web address] Date: 04/23/2023   -PWR! Seated exercises handout provided and reviewed each exercise GOALS: Goals reviewed with patient? Yes  SHORT TERM GOALS: Target date: 06/03/2023  Pt will be independent with HEP in order to improve strength and balance in order to decrease fall risk and improve function at home and work.  Baseline: EVAL: No formal HEP in place Goal status: MET   LONG TERM GOALS: Target date: 07/15/2023  Pt will improve FOTO to target score of 59 to display perceived improvements in ability to complete ADL's.  Baseline: EVAL: 49  8/20:53.2 07/15/23: 62 Goal status: MET  2.  Pt will increase by at least 0.23 m/s in order to demonstrate clinically significant improvement in community ambulation.   Baseline: EVAL= 16.23 sec with cane or 0.62 m/s  8/20: .74 m/s 9/12: .9 m/s no AD Goal status: MET  3.  Pt will decrease TUG to below 17 seconds/decrease in order to demonstrate decreased fall risk. Baseline: EVAL: 25.52 sec 8/20:22.95 sec  9/12:18.5 sec 10/31: 17.59 sec  Goal status: IN PROGRESS  4.  Pt will decrease 5TSTS by at least 20 seconds in order to demonstrate clinically significant improvement in LE strength.  Baseline: EVAL: 53.48 sec with B UE support 06/22/23: 31.27 sec 9/12:21.6 sec 10/31: deferred secondary to R knee pain  Goal status: IN PROGRESS  5.  Pt will increase by at least 70m (122ft) in order to demonstrate clinically significant improvement in cardiopulmonary endurance and community ambulation  Baseline: EVAL: 730 feet  8/20: 845 ft no AD  9/12: 1002 ft  Goal status: MET  6. Pt will demonstrate improved left knee AROM knee flex > 100 deg for improved functional mobility including transfers and walking.   Baseline: EVAL: 88 deg  8/20:90 degrees  07/15/23: 90 degrees  Goal status: NOT MET/ DISCONTINUED   7. Pt will improve FGA by at least  3 points in order to demonstrate clinically significant improvement in balance and decreased risk for falls.  Baseline: 04/29/2023=17/30  06/22/23: 17 9/12: 18 10/31:20/30 Goal status: ONGOING    8. Pt will improve BERG by at least 3 points in order to demonstrate clinically significant improvement in balance.  Baseline: 04/29/2023= 35/56  06/22/23: :44  Goal Status: MET   9.  Patient will improve many best balance test by 4 points or greater in order to indicate a clinically significant improvement in their balance as well as a decrease in risk of falls Baseline: 15 Goal status: INITIAL  ASSESSMENT:  CLINICAL IMPRESSION:  Patient presents to physical therapy for progress note this date.  Patient shows significant progress towards her long-term goals meeting her functional gait assessment goal.  Patient still does show balance deficits with several aspects of her balance including reactive postural control and dynamic gait as evidenced by portions of the many best balance test.  In addition patient's cognitive dual task with the mini best with significantly impaired and may be good target for future therapeutic interventions as patient continues to progress.  Patient is somewhat limited by her knee pain on her right side which is getting evaluated by a doctor this week.  Patient will report back to physical therapy regarding treatment plan for her right knee. Patient's condition has the potential to improve in response to therapy. Maximum improvement is yet to be obtained. The anticipated improvement is attainable and reasonable in a generally predictable time.     OBJECTIVE IMPAIRMENTS: Abnormal gait, cardiopulmonary status limiting activity, decreased activity tolerance, decreased balance, decreased coordination, decreased endurance, decreased mobility, difficulty walking, decreased ROM, decreased strength, hypomobility, impaired flexibility, and pain.   ACTIVITY LIMITATIONS: carrying,  lifting, bending, sitting, standing, squatting, sleeping, stairs, and transfers  PARTICIPATION LIMITATIONS: cleaning, laundry, shopping, community activity, and yard work  PERSONAL FACTORS: Age and 1-2 comorbidities: Arthritis, Breast cancer, TKA  are also affecting patient's functional outcome.   REHAB POTENTIAL: Good  CLINICAL DECISION MAKING: Stable/uncomplicated  EVALUATION COMPLEXITY: Low  PLAN:  PT FREQUENCY: 1-2x/week  PT DURATION: 12 weeks  PLANNED INTERVENTIONS: Therapeutic exercises, Therapeutic activity, Neuromuscular re-education, Balance training, Gait training, Patient/Family education, Self Care, Joint mobilization, Stair training, Vestibular training, Canalith repositioning, DME instructions, Dry Needling, Electrical stimulation, Spinal manipulation, Spinal mobilization, Cryotherapy, Moist heat, Manual therapy, and Re-evaluation  PLAN FOR NEXT SESSION balance and gait training.  Dual task balance with cognitive and motor dual tasks   Norman Herrlich, PT 09/07/2023, 10:43 AM

## 2023-09-09 ENCOUNTER — Ambulatory Visit: Payer: Medicare Other | Admitting: Physical Therapy

## 2023-09-14 ENCOUNTER — Ambulatory Visit: Payer: Medicare Other | Admitting: Physical Therapy

## 2023-09-16 ENCOUNTER — Ambulatory Visit: Payer: Medicare Other | Admitting: Physical Therapy

## 2023-09-21 ENCOUNTER — Ambulatory Visit: Payer: Medicare Other | Admitting: Physical Therapy

## 2023-09-23 ENCOUNTER — Ambulatory Visit: Payer: Medicare Other | Admitting: Physical Therapy

## 2023-09-28 ENCOUNTER — Ambulatory Visit: Payer: Medicare Other | Admitting: Physical Therapy

## 2023-10-12 ENCOUNTER — Ambulatory Visit: Payer: Medicare Other | Admitting: Physical Therapy

## 2023-10-12 NOTE — Therapy (Unsigned)
OUTPATIENT PHYSICAL THERAPY NEURO TREATMENT/ RECERTIFICATION NOTE ***     Patient Name: Dorothy Mccoy MRN: [id number] DOB:04-07-1951, 72 y.o., female Today's Date: 10/12/2023   PCP: Dr. Manson Allan REFERRING PROVIDER: Dr. Cristopher Peru  END OF SESSION:                Past Medical History:  Diagnosis Date   Breast cancer [facility])    bilateral   Chronic constipation    GERD (gastroesophageal reflux disease)    Osteoarthritis    Parkinson disease (HCC)    Personal history of chemotherapy    Personal history of radiation therapy    PONV (postoperative nausea and vomiting)    Psoriatic arthritis (HCC)    Pulmonary embolism (HCC) 2000   following mastectomy   Past Surgical History:  Procedure Laterality Date   BREAST LUMPECTOMY Right 2005   CATARACT EXTRACTION Bilateral    COLONOSCOPY WITH PROPOFOL N/A 04/10/2022   Procedure: COLONOSCOPY WITH PROPOFOL;  Surgeon: Regis Bill, MD;  Location: [facility] ENDOSCOPY;  Service: Endoscopy;  Laterality: N/A;   INGUINAL HERNIA REPAIR Right 2018   MASTECTOMY Left 2000   MYOMECTOMY     TOTAL KNEE ARTHROPLASTY Left    Patient Active Problem List   Diagnosis Date Noted   Pain and swelling of left lower leg 01/25/2023   Arthralgia of multiple sites 01/26/2022   Asthenia 01/26/2022   Gastroesophageal reflux disease 01/26/2022   History of mastectomy 01/26/2022   History of myomectomy 01/26/2022   Malignant tumor of breast (HCC) 01/26/2022   Postoperative retention of urine 01/26/2022   History of pulmonary embolus (PE) 01/26/2022   Constipation 01/08/2022   History of colonic polyps 01/08/2022   History of total knee arthroplasty 06/19/2021   History of breast cancer in female 06/02/2021   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   REFERRING DIAG: Parkinson's disease  THERAPY DIAG:  Difficulty in walking, not elsewhere classified  Abnormality of gait and mobility  Other abnormalities of gait and mobility  Unsteadiness on feet  Rationale for Evaluation and Treatment: Rehabilitation  SUBJECTIVE:  SUBJECTIVE STATEMENT:  Pt reports knee pain on the R side this date. Pt reptes R Knee pain 7.5/10 this date.    Pt accompanied by: self  PERTINENT HISTORY: Patient is a 72 year old female with referral for Parkinsons disease. She was diagnosed in 2015 and per [name], PA report from visit on 01/20/2023: She reports her balance has been poor as of late. This is why she has started using a walker on a regular basis. She feels she cannot turn around fast and needs to hold on to something when she does. Last fall was around Christmastime. She states since then she has been especially cautious with her gait. She has also noticed some new dyskinesias over the past six-eight months. She does not feel it is worse at a specific time of day or with relation to medication timing. Her tremor is worst in her right foot, especially when she relaxes at night. She does feel like she is moving slowly overall. She notes all of her symptoms are worse with anxiety and feeling rushed. Because of her balance and dyskinesias, she feels like she is worse overall than at her last appointment.  She has not noticed when her medication wears off. She does not typically notice when  her medication kicks in, either.  She notes she is recovering from knee replacement surgery, and this is making her left leg stiff. Otherwise, she does not feel any stiffness. She also has psoriatic arthritis.  She reports feelings of depression and anxiety. She notes she lost her mother in 2022, just before she moved here. She was very close with her. She notes not being able to get out and about has also contributed.  She last had PT for her knee replacement, never for PD.    PAIN:  Yes, 7/10 pain in the Right knee   PRECAUTIONS: Fall  WEIGHT BEARING RESTRICTIONS: No  FALLS: Has patient fallen in last 6 months?  Last fall was Dec 2023 - while trying to decorate her Christmas tree.   LIVING ENVIRONMENT: Lives with: lives alone Lives in: House/apartment Stairs:  2 level home- basement (does not currently use) steps from garage- 3 with rail on right Has following equipment at home: Single point cane, Walker - 2 wheeled, and [employer] - 4 wheeled  PLOF: Independent  PATIENT GOALS: Improve my balance and loosen up my left knee  OBJECTIVE:   DIAGNOSTIC FINDINGS: EXAM: LEFT KNEE - COMPLETE 4+ VIEW   COMPARISON:  None Available.   FINDINGS: Total knee prosthesis observed, with proximally 1.5 mm lucency along the lateral tibial tray component on the frontal projection (frontal zones 3 and 4). Equivocal/minimal lucency along the methacrylate-bone interface along the femoral component in zone 5. The appearance suggests mild findings of loosening.   Small knee effusion noted.   IMPRESSION: 1. Mild lucency along the lateral tibial tray component (zones 3 and 4) and along the Methacrylate-bone interface along the femoral component (lateral zone 5), suggesting mild loosening. 2. Small knee effusion.     Electronically Signed   By: Gaylyn Rong M.D.   On: 12/31/2022 17:16    COGNITION: Overall cognitive status: Within functional limits for tasks  assessed   SENSATION: WFL  COORDINATION: Slow initiation of movement  EDEMA:  None present    POSTURE: rounded shoulders and forward head  LOWER EXTREMITY ROM:     Active  Right Eval Left Eval  Hip flexion    Hip extension    Hip abduction  Hip adduction    Hip internal rotation    Hip external rotation    Knee flexion 115 88  Knee extension    Ankle dorsiflexion    Ankle plantarflexion    Ankle inversion    Ankle eversion     (Blank rows = not tested)  LOWER EXTREMITY MMT:    MMT Right Eval Left Eval  Hip flexion 4 4  Hip extension 4 4  Hip abduction 4 4  Hip adduction 4 4  Hip internal rotation 4 4  Hip external rotation 4 4  Knee flexion 4 4  Knee extension 4 2-  Ankle dorsiflexion 4 4  Ankle plantarflexion    Ankle inversion    Ankle eversion    (Blank rows = not tested)  TRANSFERS: Assistive device utilized: Single point cane  Sit to stand: CGA Stand to sit: CGA Chair to chair: CGA Floor:  Not assessed   GAIT: Gait pattern: step through pattern, decreased arm swing- Right, decreased arm swing- Left, decreased step length- Right, and decreased step length- Left Distance walked: 730 Assistive device utilized: Single point cane Level of assistance: CGA Comments: decreased overall step length- no observed shuffling  FUNCTIONAL TESTS:  5 times sit to stand: 53.48 sec with UE support * Left knee pain Timed up and go (TUG): 25.52 with cane 6 minute walk test: 730 feet with cane 10 meter walk test: 0.62 m/s  PATIENT SURVEYS:  FOTO 49 with goal of 59  TODAY'S TREATMENT:      Physical therapy treatment session today consisted of completing assessment of goals and administration of testing as demonstrated and documented in flow sheet, treatment, and goals section of this note. Addition treatments may be found below.                                                                                                              PATIENT  EDUCATION: Education details: Goals, Functional outcome measures, Purpose of PT Person educated: Patient Education method: Explanation Education comprehension: verbalized understanding  HOME EXERCISE PROGRAM: Access Code: [email or web address] URL: [email or web address] Date: 04/23/2023   -PWR! Seated exercises handout provided and reviewed each exercise GOALS: Goals reviewed with patient? Yes  SHORT TERM GOALS: Target date: 06/03/2023  Pt will be independent with HEP in order to improve strength and balance in order to decrease fall risk and improve function at home and work.  Baseline: EVAL: No formal HEP in place Goal status: MET   LONG TERM GOALS: Target date: 07/15/2023  Pt will improve FOTO to target score of 59 to display perceived improvements in ability to complete ADL's.  Baseline: EVAL: 49  8/20:53.2 07/15/23: 62 Goal status: MET  2.  Pt will increase by at least 0.23 m/s in order to demonstrate clinically significant improvement in community ambulation.   Baseline: EVAL= 16.23 sec with cane or 0.62 m/s  8/20: .74 m/s 9/12: .9 m/s no AD Goal status: MET  3.  Pt will decrease TUG to below 17 seconds/decrease in order to demonstrate decreased  fall risk. Baseline: EVAL: 25.52 sec 8/20:22.95 sec  9/12:18.5 sec 10/31: 17.59 sec  Goal status: IN PROGRESS  4.  Pt will decrease 5TSTS by at least 20 seconds in order to demonstrate clinically significant improvement in LE strength.  Baseline: EVAL: 53.48 sec with B UE support 06/22/23: 31.27 sec 9/12:21.6 sec 10/31: deferred secondary to R knee pain  Goal status: IN PROGRESS  5.  Pt will increase by at least 38m (111ft) in order to demonstrate clinically significant improvement in cardiopulmonary endurance and community ambulation  Baseline: EVAL: 730 feet  8/20: 845 ft no AD  9/12: 1002 ft  Goal status: MET  6. Pt will demonstrate improved left knee AROM knee flex > 100 deg for improved functional mobility  including transfers and walking.   Baseline: EVAL: 88 deg  8/20:90 degrees  07/15/23: 90 degrees  Goal status: NOT MET/ DISCONTINUED   7. Pt will improve FGA by at least 3 points in order to demonstrate clinically significant improvement in balance and decreased risk for falls.  Baseline: 04/29/2023=17/30  06/22/23: 17 9/12: 18 10/31:20/30 Goal status: ONGOING    8. Pt will improve BERG by at least 3 points in order to demonstrate clinically significant improvement in balance.  Baseline: 04/29/2023= 35/56  06/22/23: :44  Goal Status: MET   9.  Patient will improve many best balance test by 4 points or greater in order to indicate a clinically significant improvement in their balance as well as a decrease in risk of falls Baseline: 15 Goal status: INITIAL   ASSESSMENT:  CLINICAL IMPRESSION:  Patient presents to physical therapy for progress note this date.  Patient shows significant progress towards her long-term goals meeting her functional gait assessment goal.  Patient still does show balance deficits with several aspects of her balance including reactive postural control and dynamic gait as evidenced by portions of the many best balance test.  In addition patient's cognitive dual task with the mini best with significantly impaired and may be good target for future therapeutic interventions as patient continues to progress.  Patient is somewhat limited by her knee pain on her right side which is getting evaluated by a doctor this week.  Patient will report back to physical therapy regarding treatment plan for her right knee. Patient's condition has the potential to improve in response to therapy. Maximum improvement is yet to be obtained. The anticipated improvement is attainable and reasonable in a generally predictable time.     OBJECTIVE IMPAIRMENTS: Abnormal gait, cardiopulmonary status limiting activity, decreased activity tolerance, decreased balance, decreased coordination, decreased  endurance, decreased mobility, difficulty walking, decreased ROM, decreased strength, hypomobility, impaired flexibility, and pain.   ACTIVITY LIMITATIONS: carrying, lifting, bending, sitting, standing, squatting, sleeping, stairs, and transfers  PARTICIPATION LIMITATIONS: cleaning, laundry, shopping, community activity, and yard work  PERSONAL FACTORS: Age and 1-2 comorbidities: Arthritis, Breast cancer, TKA  are also affecting patient's functional outcome.   REHAB POTENTIAL: Good  CLINICAL DECISION MAKING: Stable/uncomplicated  EVALUATION COMPLEXITY: Low  PLAN:  PT FREQUENCY: 1-2x/week  PT DURATION: 12 weeks  PLANNED INTERVENTIONS: Therapeutic exercises, Therapeutic activity, Neuromuscular re-education, Balance training, Gait training, Patient/Family education, Self Care, Joint mobilization, Stair training, Vestibular training, Canalith repositioning, DME instructions, Dry Needling, Electrical stimulation, Spinal manipulation, Spinal mobilization, Cryotherapy, Moist heat, Manual therapy, and Re-evaluation  PLAN FOR NEXT SESSION balance and gait training.  Dual task balance with cognitive and motor dual tasks   Norman Herrlich, PT 10/12/2023, 9:57 AM

## 2023-10-18 ENCOUNTER — Ambulatory Visit: Payer: Medicare Other | Admitting: Physical Therapy

## 2023-11-17 ENCOUNTER — Ambulatory Visit: Payer: Medicare Other | Attending: Neurology | Admitting: Physical Therapy

## 2023-11-24 ENCOUNTER — Ambulatory Visit: Payer: Medicare Other | Admitting: Physical Therapy

## 2023-11-26 ENCOUNTER — Ambulatory Visit: Payer: Medicare Other | Admitting: Podiatry

## 2023-12-01 ENCOUNTER — Ambulatory Visit: Payer: Medicare Other | Admitting: Physical Therapy

## 2023-12-07 ENCOUNTER — Ambulatory Visit: Payer: Medicare Other | Admitting: Podiatry

## 2023-12-08 ENCOUNTER — Ambulatory Visit: Payer: Medicare Other | Admitting: Physical Therapy

## 2023-12-15 ENCOUNTER — Ambulatory Visit: Payer: Medicare Other | Admitting: Physical Therapy

## 2023-12-17 ENCOUNTER — Ambulatory Visit: Payer: Medicare Other | Admitting: Podiatry

## 2023-12-21 ENCOUNTER — Ambulatory Visit: Payer: Medicare Other | Admitting: Physical Therapy

## 2023-12-28 ENCOUNTER — Ambulatory Visit (INDEPENDENT_AMBULATORY_CARE_PROVIDER_SITE_OTHER): Payer: Medicare Other | Admitting: Podiatry

## 2023-12-28 ENCOUNTER — Ambulatory Visit: Payer: Medicare Other | Admitting: Physical Therapy

## 2023-12-28 ENCOUNTER — Encounter: Payer: Self-pay | Admitting: Podiatry

## 2023-12-28 DIAGNOSIS — M79674 Pain in right toe(s): Secondary | ICD-10-CM | POA: Diagnosis not present

## 2023-12-28 DIAGNOSIS — D2372 Other benign neoplasm of skin of left lower limb, including hip: Secondary | ICD-10-CM

## 2023-12-28 DIAGNOSIS — B351 Tinea unguium: Secondary | ICD-10-CM | POA: Diagnosis not present

## 2023-12-28 DIAGNOSIS — M79675 Pain in left toe(s): Secondary | ICD-10-CM | POA: Diagnosis not present

## 2023-12-28 NOTE — Progress Notes (Signed)
   Chief Complaint  Patient presents with   Nail Problem    "I want my feet trimmed.  I have Parkinson's and Psoriatic Arthritis.  I may have a corn on my toe." N - toenails and corns L - 1-5 bilateral D - 6 mos O - gradually worse C - ingrowing, thick, discolored A - shoes rub T - I usually get Pedicures, I tried to cut them myself, Tried OTC fungus medicine    SUBJECTIVE Patient presents to office today complaining of elongated, thickened nails that cause pain while ambulating in shoes.  Patient is unable to trim their own nails.  Patient also states that she has a symptomatic skin lesion to the fifth digit of the left foot.  That she would like to have it evaluated.  Patient is here for further evaluation and treatment.  Past Medical History:  Diagnosis Date   Breast cancer (HCC)    bilateral   Chronic constipation    GERD (gastroesophageal reflux disease)    Osteoarthritis    Parkinson disease (HCC)    Personal history of chemotherapy    Personal history of radiation therapy    PONV (postoperative nausea and vomiting)    Psoriatic arthritis (HCC)    Pulmonary embolism (HCC) 2000   following mastectomy    Allergies  Allergen Reactions   Ciprofloxacin Hcl Nausea Only     OBJECTIVE General Patient is awake, alert, and oriented x 3 and in no acute distress. Derm Skin is dry and supple bilateral. Negative open lesions or macerations. Remaining integument unremarkable. Nails are tender, long, thickened and dystrophic with subungual debris, consistent with onychomycosis, 1-5 bilateral. No signs of infection noted. Vasc  DP and PT pedal pulses palpable bilaterally. Temperature gradient within normal limits.  Neuro grossly intact via light touch Musculoskeletal Exam No symptomatic pedal deformities noted bilateral. Muscular strength within normal limits.  ASSESSMENT 1.  Pain due to onychomycosis of toenails both 2.  Eccrine poroma fifth digit left  PLAN OF CARE 1.  Patient evaluated today.  2. Instructed to maintain good pedal hygiene and foot care.  3. Mechanical debridement of nails 1-5 bilaterally performed using a nail nipper. Filed with dremel without incident.  4.  Excisional debridement of the hyperkeratotic skin lesion was performed today using a 312 scalpel without incident or bleeding.  Salicylic acid and Band-Aid applied  5.  Return to clinic in 3 mos.    Felecia Shelling, DPM [facility]  Dr. Felecia Shelling, DPM    [address] [place], [place] [address]                Office [phone]  Fax [phone]

## 2024-01-04 ENCOUNTER — Ambulatory Visit: Payer: Medicare Other | Admitting: Physical Therapy

## 2024-01-10 ENCOUNTER — Ambulatory Visit: Payer: Medicare Other | Admitting: Physical Therapy

## 2024-01-12 ENCOUNTER — Ambulatory Visit: Payer: Medicare Other | Admitting: Physical Therapy

## 2024-03-31 ENCOUNTER — Other Ambulatory Visit: Payer: Self-pay | Admitting: Family Medicine

## 2024-03-31 DIAGNOSIS — Z1231 Encounter for screening mammogram for malignant neoplasm of breast: Secondary | ICD-10-CM

## 2024-04-14 ENCOUNTER — Encounter

## 2024-04-21 ENCOUNTER — Encounter

## 2024-05-02 ENCOUNTER — Ambulatory Visit: Admitting: Podiatry

## 2024-05-03 ENCOUNTER — Ambulatory Visit
Admission: RE | Admit: 2024-05-03 | Discharge: 2024-05-03 | Disposition: A | Discharge status: Home / Self Care | Source: Ambulatory Visit | Attending: Family Medicine | Admitting: Family Medicine

## 2024-05-03 DIAGNOSIS — Z1231 Encounter for screening mammogram for malignant neoplasm of breast: Secondary | ICD-10-CM | POA: Diagnosis present

## 2024-05-16 ENCOUNTER — Ambulatory Visit: Admitting: Podiatry

## 2024-05-23 ENCOUNTER — Ambulatory Visit: Admitting: Podiatry

## 2024-05-30 ENCOUNTER — Ambulatory Visit: Admitting: Podiatry
